# Patient Record
Sex: Female | Born: 1960 | Race: White | Hispanic: No | Smoking: Current every day smoker
Health system: Southern US, Community
[De-identification: ages and names within clinical notes are randomized; demographics above are authoritative.]

## PROBLEM LIST (undated history)

## (undated) DIAGNOSIS — O1490 Unspecified pre-eclampsia, unspecified trimester: Secondary | ICD-10-CM

## (undated) DIAGNOSIS — M7918 Myalgia, other site: Secondary | ICD-10-CM

## (undated) DIAGNOSIS — R0602 Shortness of breath: Secondary | ICD-10-CM

## (undated) DIAGNOSIS — I5032 Chronic diastolic (congestive) heart failure: Secondary | ICD-10-CM

## (undated) DIAGNOSIS — I10 Essential (primary) hypertension: Secondary | ICD-10-CM

## (undated) DIAGNOSIS — M5020 Other cervical disc displacement, unspecified cervical region: Secondary | ICD-10-CM

## (undated) DIAGNOSIS — M545 Low back pain, unspecified: Secondary | ICD-10-CM

## (undated) DIAGNOSIS — E785 Hyperlipidemia, unspecified: Secondary | ICD-10-CM

## (undated) DIAGNOSIS — M1990 Unspecified osteoarthritis, unspecified site: Secondary | ICD-10-CM

## (undated) DIAGNOSIS — E119 Type 2 diabetes mellitus without complications: Secondary | ICD-10-CM

## (undated) DIAGNOSIS — G8929 Other chronic pain: Secondary | ICD-10-CM

## (undated) DIAGNOSIS — G4733 Obstructive sleep apnea (adult) (pediatric): Secondary | ICD-10-CM

## (undated) DIAGNOSIS — Z9989 Dependence on other enabling machines and devices: Secondary | ICD-10-CM

## (undated) HISTORY — DX: Unspecified osteoarthritis, unspecified site: M19.90

## (undated) HISTORY — DX: Type 2 diabetes mellitus without complications: E11.9

## (undated) HISTORY — DX: Low back pain, unspecified: M54.50

## (undated) HISTORY — DX: Hyperlipidemia, unspecified: E78.5

## (undated) HISTORY — DX: Other cervical disc displacement, unspecified cervical region: M50.20

## (undated) HISTORY — DX: Chronic diastolic (congestive) heart failure: I50.32

## (undated) HISTORY — DX: Unspecified pre-eclampsia, unspecified trimester: O14.90

## (undated) HISTORY — DX: Low back pain: M54.5

## (undated) HISTORY — DX: Other chronic pain: G89.29

## (undated) HISTORY — DX: Myalgia, other site: M79.18

## (undated) HISTORY — DX: Shortness of breath: R06.02

## (undated) HISTORY — DX: Dependence on other enabling machines and devices: Z99.89

## (undated) HISTORY — DX: Obstructive sleep apnea (adult) (pediatric): G47.33

## (undated) HISTORY — PX: TUBAL LIGATION: SHX77

## (undated) HISTORY — DX: Essential (primary) hypertension: I10

---

## 2000-05-05 ENCOUNTER — Ambulatory Visit (HOSPITAL_BASED_OUTPATIENT_CLINIC_OR_DEPARTMENT_OTHER): Admission: RE | Admit: 2000-05-05 | Discharge: 2000-05-05 | Payer: Self-pay | Admitting: *Deleted

## 2001-04-06 ENCOUNTER — Encounter: Payer: Self-pay | Admitting: Emergency Medicine

## 2001-04-06 ENCOUNTER — Encounter: Payer: Self-pay | Admitting: Orthopedic Surgery

## 2001-04-06 ENCOUNTER — Observation Stay (HOSPITAL_COMMUNITY): Admission: EM | Admit: 2001-04-06 | Discharge: 2001-04-07 | Payer: Self-pay | Admitting: Emergency Medicine

## 2001-04-07 ENCOUNTER — Encounter: Payer: Self-pay | Admitting: Orthopedic Surgery

## 2005-11-08 ENCOUNTER — Emergency Department (HOSPITAL_COMMUNITY): Admission: EM | Admit: 2005-11-08 | Discharge: 2005-11-08 | Payer: Self-pay | Admitting: Emergency Medicine

## 2009-11-20 ENCOUNTER — Ambulatory Visit (HOSPITAL_COMMUNITY): Admission: RE | Admit: 2009-11-20 | Discharge: 2009-11-20 | Payer: Self-pay | Admitting: Gastroenterology

## 2010-04-27 ENCOUNTER — Encounter: Admission: RE | Admit: 2010-04-27 | Discharge: 2010-04-27 | Payer: Self-pay | Admitting: Gastroenterology

## 2010-06-04 ENCOUNTER — Inpatient Hospital Stay (HOSPITAL_COMMUNITY): Admission: EM | Admit: 2010-06-04 | Discharge: 2009-11-05 | Payer: Self-pay | Admitting: Emergency Medicine

## 2010-09-14 LAB — PROTEIN, BODY FLUID: Total protein, fluid: <3 g/dL

## 2010-09-14 LAB — BODY FLUID CELL COUNT WITH DIFFERENTIAL
Eos, Fluid: 0 %
Lymphs, Fluid: 11 %
Monocyte-Macrophage-Serous Fluid: 88 % (ref 50–90)
Neutrophil Count, Fluid: 1 % (ref 0–25)
Total Nucleated Cell Count, Fluid: 210 cu mm (ref 0–1000)

## 2010-09-14 LAB — ALBUMIN, FLUID (OTHER): Albumin, Fluid: <1 g/dL

## 2010-09-14 LAB — PATHOLOGIST SMEAR REVIEW

## 2010-09-15 LAB — COMPREHENSIVE METABOLIC PANEL
ALT: 22 U/L (ref 0–35)
ALT: 23 U/L (ref 0–35)
ALT: 23 U/L (ref 0–35)
ALT: 24 U/L (ref 0–35)
ALT: 25 U/L (ref 0–35)
ALT: 32 U/L (ref 0–35)
AST: 119 U/L — ABNORMAL HIGH (ref 0–37)
AST: 129 U/L — ABNORMAL HIGH (ref 0–37)
AST: 136 U/L — ABNORMAL HIGH (ref 0–37)
AST: 136 U/L — ABNORMAL HIGH (ref 0–37)
AST: 140 U/L — ABNORMAL HIGH (ref 0–37)
AST: 174 U/L — ABNORMAL HIGH (ref 0–37)
Albumin: 1.7 g/dL — ABNORMAL LOW (ref 3.5–5.2)
Albumin: 1.9 g/dL — ABNORMAL LOW (ref 3.5–5.2)
Albumin: 1.9 g/dL — ABNORMAL LOW (ref 3.5–5.2)
Albumin: 1.9 g/dL — ABNORMAL LOW (ref 3.5–5.2)
Albumin: 2 g/dL — ABNORMAL LOW (ref 3.5–5.2)
Albumin: 2.3 g/dL — ABNORMAL LOW (ref 3.5–5.2)
Alkaline Phosphatase: 110 U/L (ref 39–117)
Alkaline Phosphatase: 113 U/L (ref 39–117)
Alkaline Phosphatase: 116 U/L (ref 39–117)
Alkaline Phosphatase: 128 U/L — ABNORMAL HIGH (ref 39–117)
Alkaline Phosphatase: 153 U/L — ABNORMAL HIGH (ref 39–117)
Alkaline Phosphatase: 98 U/L (ref 39–117)
BUN: 5 mg/dL — ABNORMAL LOW (ref 6–23)
BUN: 5 mg/dL — ABNORMAL LOW (ref 6–23)
BUN: 5 mg/dL — ABNORMAL LOW (ref 6–23)
BUN: 5 mg/dL — ABNORMAL LOW (ref 6–23)
BUN: 6 mg/dL (ref 6–23)
BUN: 6 mg/dL (ref 6–23)
CO2: 23 mEq/L (ref 19–32)
CO2: 25 mEq/L (ref 19–32)
CO2: 25 mEq/L (ref 19–32)
CO2: 26 mEq/L (ref 19–32)
CO2: 26 mEq/L (ref 19–32)
CO2: 27 mEq/L (ref 19–32)
Calcium: 7 mg/dL — ABNORMAL LOW (ref 8.4–10.5)
Calcium: 7.2 mg/dL — ABNORMAL LOW (ref 8.4–10.5)
Calcium: 7.4 mg/dL — ABNORMAL LOW (ref 8.4–10.5)
Calcium: 7.5 mg/dL — ABNORMAL LOW (ref 8.4–10.5)
Calcium: 7.5 mg/dL — ABNORMAL LOW (ref 8.4–10.5)
Calcium: 8 mg/dL — ABNORMAL LOW (ref 8.4–10.5)
Chloride: 77 mEq/L — ABNORMAL LOW (ref 96–112)
Chloride: 78 mEq/L — ABNORMAL LOW (ref 96–112)
Chloride: 84 mEq/L — ABNORMAL LOW (ref 96–112)
Chloride: 87 mEq/L — ABNORMAL LOW (ref 96–112)
Chloride: 89 mEq/L — ABNORMAL LOW (ref 96–112)
Chloride: 94 mEq/L — ABNORMAL LOW (ref 96–112)
Creatinine, Ser: 0.68 mg/dL (ref 0.4–1.2)
Creatinine, Ser: 0.73 mg/dL (ref 0.4–1.2)
Creatinine, Ser: 0.74 mg/dL (ref 0.4–1.2)
Creatinine, Ser: 0.77 mg/dL (ref 0.4–1.2)
Creatinine, Ser: 0.82 mg/dL (ref 0.4–1.2)
Creatinine, Ser: 0.91 mg/dL (ref 0.4–1.2)
GFR calc Af Amer: >60 mL/min (ref >60)
GFR calc Af Amer: >60 mL/min (ref >60)
GFR calc Af Amer: >60 mL/min (ref >60)
GFR calc Af Amer: >60 mL/min (ref >60)
GFR calc Af Amer: >60 mL/min (ref >60)
GFR calc Af Amer: >60 mL/min (ref >60)
GFR calc non Af Amer: >60 mL/min (ref >60)
GFR calc non Af Amer: >60 mL/min (ref >60)
GFR calc non Af Amer: >60 mL/min (ref >60)
GFR calc non Af Amer: >60 mL/min (ref >60)
GFR calc non Af Amer: >60 mL/min (ref >60)
GFR calc non Af Amer: >60 mL/min (ref >60)
Glucose, Bld: 100 mg/dL — ABNORMAL HIGH (ref 70–99)
Glucose, Bld: 116 mg/dL — ABNORMAL HIGH (ref 70–99)
Glucose, Bld: 126 mg/dL — ABNORMAL HIGH (ref 70–99)
Glucose, Bld: 88 mg/dL (ref 70–99)
Glucose, Bld: 99 mg/dL (ref 70–99)
Glucose, Bld: 99 mg/dL (ref 70–99)
Potassium: 2.7 mEq/L — CL (ref 3.5–5.1)
Potassium: 2.7 mEq/L — CL (ref 3.5–5.1)
Potassium: 3.1 mEq/L — ABNORMAL LOW (ref 3.5–5.1)
Potassium: 3.8 mEq/L (ref 3.5–5.1)
Potassium: 4 mEq/L (ref 3.5–5.1)
Potassium: 4.6 mEq/L (ref 3.5–5.1)
Sodium: 117 mEq/L — CL (ref 135–145)
Sodium: 118 mEq/L — CL (ref 135–145)
Sodium: 120 mEq/L — ABNORMAL LOW (ref 135–145)
Sodium: 122 mEq/L — ABNORMAL LOW (ref 135–145)
Sodium: 123 mEq/L — ABNORMAL LOW (ref 135–145)
Sodium: 123 mEq/L — ABNORMAL LOW (ref 135–145)
Total Bilirubin: 7.8 mg/dL — ABNORMAL HIGH (ref 0.3–1.2)
Total Bilirubin: 8 mg/dL — ABNORMAL HIGH (ref 0.3–1.2)
Total Bilirubin: 8.2 mg/dL — ABNORMAL HIGH (ref 0.3–1.2)
Total Bilirubin: 8.2 mg/dL — ABNORMAL HIGH (ref 0.3–1.2)
Total Bilirubin: 8.5 mg/dL — ABNORMAL HIGH (ref 0.3–1.2)
Total Bilirubin: 9.1 mg/dL — ABNORMAL HIGH (ref 0.3–1.2)
Total Protein: 11.8 g/dL — ABNORMAL HIGH (ref 6.0–8.3)
Total Protein: 7.8 g/dL (ref 6.0–8.3)
Total Protein: 8.1 g/dL (ref 6.0–8.3)
Total Protein: 8.1 g/dL (ref 6.0–8.3)
Total Protein: 8.5 g/dL — ABNORMAL HIGH (ref 6.0–8.3)
Total Protein: 9.9 g/dL — ABNORMAL HIGH (ref 6.0–8.3)

## 2010-09-15 LAB — BASIC METABOLIC PANEL
BUN: 6 mg/dL (ref 6–23)
BUN: 6 mg/dL (ref 6–23)
BUN: 7 mg/dL (ref 6–23)
CO2: 22 mEq/L (ref 19–32)
CO2: 22 mEq/L (ref 19–32)
CO2: 25 mEq/L (ref 19–32)
Calcium: 7.1 mg/dL — ABNORMAL LOW (ref 8.4–10.5)
Calcium: 7.8 mg/dL — ABNORMAL LOW (ref 8.4–10.5)
Calcium: 8.2 mg/dL — ABNORMAL LOW (ref 8.4–10.5)
Chloride: 83 mEq/L — ABNORMAL LOW (ref 96–112)
Chloride: 95 mEq/L — ABNORMAL LOW (ref 96–112)
Chloride: 98 mEq/L (ref 96–112)
Creatinine, Ser: 0.68 mg/dL (ref 0.4–1.2)
Creatinine, Ser: 0.71 mg/dL (ref 0.4–1.2)
Creatinine, Ser: 0.73 mg/dL (ref 0.4–1.2)
GFR calc Af Amer: >60 mL/min (ref >60)
GFR calc Af Amer: >60 mL/min (ref >60)
GFR calc Af Amer: >60 mL/min (ref >60)
GFR calc non Af Amer: >60 mL/min (ref >60)
GFR calc non Af Amer: >60 mL/min (ref >60)
GFR calc non Af Amer: >60 mL/min (ref >60)
Glucose, Bld: 105 mg/dL — ABNORMAL HIGH (ref 70–99)
Glucose, Bld: 83 mg/dL (ref 70–99)
Glucose, Bld: 91 mg/dL (ref 70–99)
Potassium: 3.6 mEq/L (ref 3.5–5.1)
Potassium: 4.8 mEq/L (ref 3.5–5.1)
Potassium: 5 mEq/L (ref 3.5–5.1)
Sodium: 118 mEq/L — CL (ref 135–145)
Sodium: 125 mEq/L — ABNORMAL LOW (ref 135–145)
Sodium: 125 mEq/L — ABNORMAL LOW (ref 135–145)

## 2010-09-15 LAB — CBC
HCT: 29.3 % — ABNORMAL LOW (ref 36.0–46.0)
HCT: 30.3 % — ABNORMAL LOW (ref 36.0–46.0)
HCT: 30.8 % — ABNORMAL LOW (ref 36.0–46.0)
HCT: 31.3 % — ABNORMAL LOW (ref 36.0–46.0)
HCT: 36.2 % (ref 36.0–46.0)
Hemoglobin: 10.5 g/dL — ABNORMAL LOW (ref 12.0–15.0)
Hemoglobin: 10.7 g/dL — ABNORMAL LOW (ref 12.0–15.0)
Hemoglobin: 10.8 g/dL — ABNORMAL LOW (ref 12.0–15.0)
Hemoglobin: 11.1 g/dL — ABNORMAL LOW (ref 12.0–15.0)
Hemoglobin: 12.8 g/dL (ref 12.0–15.0)
MCHC: 35 g/dL (ref 30.0–36.0)
MCHC: 35.3 g/dL (ref 30.0–36.0)
MCHC: 35.4 g/dL (ref 30.0–36.0)
MCHC: 35.6 g/dL (ref 30.0–36.0)
MCHC: 35.8 g/dL (ref 30.0–36.0)
MCV: 105.3 fL — ABNORMAL HIGH (ref 78.0–100.0)
MCV: 105.6 fL — ABNORMAL HIGH (ref 78.0–100.0)
MCV: 105.8 fL — ABNORMAL HIGH (ref 78.0–100.0)
MCV: 105.8 fL — ABNORMAL HIGH (ref 78.0–100.0)
MCV: 106.3 fL — ABNORMAL HIGH (ref 78.0–100.0)
Platelets: 121 10*3/uL — ABNORMAL LOW (ref 150–400)
Platelets: 130 10*3/uL — ABNORMAL LOW (ref 150–400)
Platelets: 135 10*3/uL — ABNORMAL LOW (ref 150–400)
Platelets: 136 10*3/uL — ABNORMAL LOW (ref 150–400)
Platelets: 141 10*3/uL — ABNORMAL LOW (ref 150–400)
RBC: 2.79 MIL/uL — ABNORMAL LOW (ref 3.87–5.11)
RBC: 2.87 MIL/uL — ABNORMAL LOW (ref 3.87–5.11)
RBC: 2.9 MIL/uL — ABNORMAL LOW (ref 3.87–5.11)
RBC: 2.96 MIL/uL — ABNORMAL LOW (ref 3.87–5.11)
RBC: 3.42 MIL/uL — ABNORMAL LOW (ref 3.87–5.11)
RDW: 18.4 % — ABNORMAL HIGH (ref 11.5–15.5)
RDW: 18.5 % — ABNORMAL HIGH (ref 11.5–15.5)
RDW: 18.6 % — ABNORMAL HIGH (ref 11.5–15.5)
RDW: 18.8 % — ABNORMAL HIGH (ref 11.5–15.5)
RDW: 18.9 % — ABNORMAL HIGH (ref 11.5–15.5)
WBC: 13 10*3/uL — ABNORMAL HIGH (ref 4.0–10.5)
WBC: 14.3 10*3/uL — ABNORMAL HIGH (ref 4.0–10.5)
WBC: 14.6 10*3/uL — ABNORMAL HIGH (ref 4.0–10.5)
WBC: 15 10*3/uL — ABNORMAL HIGH (ref 4.0–10.5)
WBC: 15.7 10*3/uL — ABNORMAL HIGH (ref 4.0–10.5)

## 2010-09-15 LAB — URINE MICROSCOPIC-ADD ON

## 2010-09-15 LAB — URINALYSIS, ROUTINE W REFLEX MICROSCOPIC
Glucose, UA: NEGATIVE mg/dL
Hgb urine dipstick: NEGATIVE
Ketones, ur: 15 mg/dL — AB
Nitrite: POSITIVE — AB
Protein, ur: 30 mg/dL — AB
Specific Gravity, Urine: 1.026 (ref 1.005–1.030)
Urobilinogen, UA: 2 mg/dL — ABNORMAL HIGH (ref 0.0–1.0)
pH: 5 (ref 5.0–8.0)

## 2010-09-15 LAB — MAGNESIUM
Magnesium: 1.1 mg/dL — ABNORMAL LOW (ref 1.5–2.5)
Magnesium: 1.9 mg/dL (ref 1.5–2.5)

## 2010-09-15 LAB — PROTIME-INR
INR: 1.58 — ABNORMAL HIGH (ref 0.00–1.49)
INR: 1.66 — ABNORMAL HIGH (ref 0.00–1.49)
INR: 1.72 — ABNORMAL HIGH (ref 0.00–1.49)
INR: 1.73 — ABNORMAL HIGH (ref 0.00–1.49)
INR: 1.76 — ABNORMAL HIGH (ref 0.00–1.49)
INR: 1.81 — ABNORMAL HIGH (ref 0.00–1.49)
Prothrombin Time: 18.7 seconds — ABNORMAL HIGH (ref 11.6–15.2)
Prothrombin Time: 19.5 seconds — ABNORMAL HIGH (ref 11.6–15.2)
Prothrombin Time: 20 seconds — ABNORMAL HIGH (ref 11.6–15.2)
Prothrombin Time: 20.1 seconds — ABNORMAL HIGH (ref 11.6–15.2)
Prothrombin Time: 20.4 seconds — ABNORMAL HIGH (ref 11.6–15.2)
Prothrombin Time: 20.8 seconds — ABNORMAL HIGH (ref 11.6–15.2)

## 2010-09-15 LAB — AMMONIA
Ammonia: 111 umol/L — ABNORMAL HIGH (ref 11–35)
Ammonia: 45 umol/L — ABNORMAL HIGH (ref 11–35)
Ammonia: 45 umol/L — ABNORMAL HIGH (ref 11–35)

## 2010-09-15 LAB — VITAMIN B12: Vitamin B-12: 1880 pg/mL — ABNORMAL HIGH (ref 211–911)

## 2010-09-15 LAB — DIFFERENTIAL
Basophils Absolute: 0 10*3/uL (ref 0.0–0.1)
Basophils Relative: 0 % (ref 0–1)
Eosinophils Absolute: 0.1 10*3/uL (ref 0.0–0.7)
Eosinophils Relative: 1 % (ref 0–5)
Lymphocytes Relative: 12 % (ref 12–46)
Lymphs Abs: 1.7 10*3/uL (ref 0.7–4.0)
Monocytes Absolute: 1.7 10*3/uL — ABNORMAL HIGH (ref 0.1–1.0)
Monocytes Relative: 11 % (ref 3–12)
Neutro Abs: 11.1 10*3/uL — ABNORMAL HIGH (ref 1.7–7.7)
Neutrophils Relative %: 76 % (ref 43–77)

## 2010-09-15 LAB — URINE CULTURE: Colony Count: 25000

## 2010-09-15 LAB — RAPID URINE DRUG SCREEN, HOSP PERFORMED
Amphetamines: NOT DETECTED
Barbiturates: NOT DETECTED
Benzodiazepines: NOT DETECTED
Cocaine: NOT DETECTED
Opiates: POSITIVE — AB
Tetrahydrocannabinol: NOT DETECTED

## 2010-09-15 LAB — T4, FREE: Free T4: 1.53 ng/dL (ref 0.80–1.80)

## 2010-09-15 LAB — GLUCOSE, CAPILLARY
Glucose-Capillary: 103 mg/dL — ABNORMAL HIGH (ref 70–99)
Glucose-Capillary: 111 mg/dL — ABNORMAL HIGH (ref 70–99)

## 2010-09-15 LAB — TSH: TSH: 9.884 u[IU]/mL — ABNORMAL HIGH (ref 0.350–4.500)

## 2010-09-15 LAB — APTT
aPTT: 44 seconds — ABNORMAL HIGH (ref 24–37)
aPTT: 44 seconds — ABNORMAL HIGH (ref 24–37)

## 2010-09-15 LAB — HEPATITIS PANEL, ACUTE
HCV Ab: NEGATIVE
Hep A IgM: NEGATIVE
Hep B C IgM: POSITIVE — AB
Hepatitis B Surface Ag: NEGATIVE

## 2010-09-15 LAB — HIV ANTIBODY (ROUTINE TESTING W REFLEX): HIV: NONREACTIVE

## 2010-09-15 LAB — T3, FREE: T3, Free: 1.8 pg/mL — ABNORMAL LOW (ref 2.3–4.2)

## 2010-09-15 LAB — FOLATE RBC: RBC Folate: 291 ng/mL (ref 180–600)

## 2010-09-15 LAB — CORTISOL: Cortisol, Plasma: 20 ug/dL

## 2010-09-15 LAB — OSMOLALITY: Osmolality: 243 mOsm/kg — ABNORMAL LOW (ref 275–300)

## 2010-09-15 LAB — FOLATE: Folate: 4.7 ng/mL

## 2010-09-15 LAB — OSMOLALITY, URINE: Osmolality, Ur: 520 mOsm/kg (ref 390–1090)

## 2010-11-13 NOTE — H&P (Signed)
[place]. [facility]  Patient:    Stacey Monroe, Stacey Monroe Visit Number: [id number] MRN: [id number]          Service Type: OBV Location: 5000 5028 01 Attending Physician:  Burnard Bunting Dictated by:   Cammy Copa, M.D. Admit Date:  04/06/2001                           History and Physical  CHIEF COMPLAINT:  Right elbow pain.  HISTORY OF PRESENT ILLNESS:  Stacey Monroe is a 50 year old, right-hand dominant female who fell in the hall tonight about 1800 hours.  She complains of right elbow pain.  She denies any other orthopedic complaints.  She last ate or drank something at 4 p.m.  She did have a six-pack today, which she finished also around 4 p.m.  PAST MEDICAL HISTORY:  Significant for depression.  PAST SURGICAL HISTORY:  She has no past surgical history.  ALLERGIES:  She is allergic to PENICILLIN.  CURRENT MEDICATIONS:  Zoloft.  PHYSICAL EXAMINATION:  She is alert and oriented x 3.  CHEST:  Clear to auscultation.  HEART:  Regular rate and rhythm.  ABDOMEN:  Benign exam.  EXTREMITIES:  She has right elbow deformity and no wrist pain.  EPL, FPL, and interosseous 5+/5.  Radial pulse 2+/4.  Sensation is okay in the median and radial ulnar distribution.  X-rays show a posterolateral dislocation of the right elbow.  IMPRESSION:  Dislocation of right elbow.  PLAN:  Closed reduction is attempted after the risks and benefits are discussed with the patient.  I did inject about 10 cc of lidocaine into the joint through a posterolateral portal.  I also gave her 4 mg of morphine and Versed.  Adequate analgesia was really able to be obtained as the reduction was painful for the patient.  Because of her alcohol intake, it think it would be safer to proceed with repeat closed reduction in the OR.  The risks and benefits were discussed with the patient and her boyfriend, as well as with Conan Bowens, her sister.  The primary risks include a need for  possible open reduction, as well as risk of fracture, as well as nerve and muscle damage and subsequent elbow stiffness.  All questions were answered.  The risks and benefits again are discussed with the patient, her boyfriend, Francis Dowse, and her sister.  Her sister is her closest relative and she does give consent. Dictated by:   Cammy Copa, M.D. Attending Physician:  Burnard Bunting DD:  04/06/01 TD:  04/07/01 Job: [phone]

## 2010-11-13 NOTE — Op Note (Signed)
[place]. [facility]  Patient:    Stacey Monroe, Stacey Monroe Visit Number: [id number] MRN: [id number]          Service Type: OBV Location: 5000 5028 01 Attending Physician:  Burnard Bunting Dictated by:   Cammy Copa, M.D. Admit Date:  04/06/2001 Discharge Date: 04/07/2001                             Operative Report  PREOPERATIVE DIAGNOSIS:  Right elbow posterolateral dislocation.  POSTOPERATIVE DIAGNOSIS:  Right elbow posterolateral dislocation.  PROCEDURE:  Closed reduction of right posterolateral elbow dislocation.  SURGEON:  Cammy Copa, M.D.  ANESTHESIA:  General endotracheal.  ESTIMATED BLOOD LOSS:  None.  INDICATION:  The patient is a 50 year old patient who sustained a posterolateral right elbow dislocation.  She presents now for operative management after a failed attempt at closed reduction in the ER.  DESCRIPTION OF PROCEDURE:  The patient was brought in the operating room, where general endotracheal anesthesia was induced.  Under fluoroscopic guidance the elbow was noted to have sustained a posterior lateral dislocation.  The ulna and radius were translated medially with distraction. This was confirmed in the AP and lateral planes under fluoroscopy.  At that time using a combination of distraction, traction, and thumb pressure over the olecranon, once the medial and lateral component was corrected, the posterior component was corrected.  The elbow was then taken through a range of motion and found to be stable in terms of posterior dislocation but was unstable medially and laterally with internal rotation of the humerus.  For this reason the patient was splinted in the midrange between pronation and supination. Intraoperative fluoroscopy demonstrated a concentric reduction with no fractures.  There was some posterolateral rotatory instability noted, but that was stable when the arm was pronated and taken through a range of motion.   The patient was transferred to the recovery room in stable condition. Dictated by:   Cammy Copa, M.D. Attending Physician:  Burnard Bunting DD:  04/06/01 TD:  04/07/01 Job: [id number]

## 2011-12-08 ENCOUNTER — Other Ambulatory Visit: Payer: Self-pay | Admitting: Gastroenterology

## 2011-12-08 DIAGNOSIS — K7460 Unspecified cirrhosis of liver: Secondary | ICD-10-CM

## 2012-01-03 ENCOUNTER — Ambulatory Visit
Admission: RE | Admit: 2012-01-03 | Discharge: 2012-01-03 | Disposition: A | Discharge status: Home / Self Care | Payer: No Typology Code available for payment source | Source: Ambulatory Visit | Attending: Gastroenterology | Admitting: Gastroenterology

## 2012-01-03 DIAGNOSIS — K7460 Unspecified cirrhosis of liver: Secondary | ICD-10-CM

## 2016-03-29 ENCOUNTER — Ambulatory Visit: Payer: No Typology Code available for payment source | Admitting: Physician Assistant

## 2016-04-06 ENCOUNTER — Encounter: Payer: Self-pay | Admitting: Physician Assistant

## 2016-04-06 ENCOUNTER — Ambulatory Visit (INDEPENDENT_AMBULATORY_CARE_PROVIDER_SITE_OTHER): Payer: Medicare Other | Admitting: Physician Assistant

## 2016-04-06 VITALS — BP 160/90 | HR 87 | Ht 68.0 in | Wt 274.0 lb

## 2016-04-06 DIAGNOSIS — R0602 Shortness of breath: Secondary | ICD-10-CM | POA: Diagnosis not present

## 2016-04-06 DIAGNOSIS — Z720 Tobacco use: Secondary | ICD-10-CM

## 2016-04-06 DIAGNOSIS — R002 Palpitations: Secondary | ICD-10-CM | POA: Diagnosis not present

## 2016-04-06 DIAGNOSIS — K7030 Alcoholic cirrhosis of liver without ascites: Secondary | ICD-10-CM

## 2016-04-06 DIAGNOSIS — R0789 Other chest pain: Secondary | ICD-10-CM

## 2016-04-06 DIAGNOSIS — F439 Reaction to severe stress, unspecified: Secondary | ICD-10-CM | POA: Insufficient documentation

## 2016-04-06 DIAGNOSIS — I10 Essential (primary) hypertension: Secondary | ICD-10-CM | POA: Diagnosis not present

## 2016-04-06 DIAGNOSIS — K7460 Unspecified cirrhosis of liver: Secondary | ICD-10-CM | POA: Insufficient documentation

## 2016-04-06 MED ORDER — LISINOPRIL 10 MG PO TABS: 10.0000 mg | ORAL_TABLET | Freq: Every day | ORAL | 3 refills | Status: DC

## 2016-04-06 MED ORDER — LISINOPRIL 5 MG PO TABS: 5.0000 mg | ORAL_TABLET | Freq: Every day | ORAL | 3 refills | Status: DC

## 2016-04-06 NOTE — Progress Notes (Signed)
Cardiology Office Note    Date:  04/06/2016   ID:  [name], DOB [birth year], MRN [id number]  PCP:  No primary care provider on file.  Cardiologist:   Chief Complaint  Patient presents with  . Chest Pain    History of Present Illness:  Stacey Monroe is a 55 y.o. female referred to [place] by Karin Lieu [facility] for evaluation chest pain. She has a history of hypertension and amlodipine was just added to her medications.  Patient complains of palpitations with some dizziness and ringing in her ears that would last a few seconds. Associated chest pain described as a stabbing sensation. Feels like a fist in her chest but no heaviness or tightness. She felt like she was holding her breath. Has chronic dyspnea on exertion she attributes to lack of exercise and weight.  Extreme stress since January. Reduced a stressor last week and palpitations have lessoned and no associated chest pain. HTN, no DM, ? HLD. Smokes 1 ppd x 20 yrs. Disabled from cirrhotic liver secondary to ETOH-quit in 2011.    Past Medical History:  Diagnosis Date  . Chronic diastolic heart failure ([place])   . Chronic musculoskeletal pain   . Diabetes mellitus without complication ([place])   . Hyperlipidemia   . Hypertension   . Low back pain   . OSA on CPAP   . Osteoarthritis    knees  . Pre-eclampsia   . Sleep apnea, obstructive   . Slipped cervical disc   . SOB (shortness of breath)     No past surgical history on file.  Current Medications: Outpatient Medications Prior to Visit  Medication Sig Dispense Refill  . acetaminophen (TYLENOL) 500 MG tablet Take 1,000 mg by mouth 3 (three) times daily.    Marland Kitchen aspirin 325 MG tablet Take 325 mg by mouth daily.    Marland Kitchen atorvastatin (LIPITOR) 40 MG tablet Take 40 mg by mouth daily.    . B Complex Vitamins (B COMPLEX PO) Take by mouth.    . bimatoprost (LUMIGAN) 0.01 % SOLN 1 drop at bedtime.    Marland Kitchen diltiazem (CARDIZEM LA) 420 MG 24 hr tablet Take 420 mg by mouth daily.      Marland Kitchen ezetimibe (ZETIA) 10 MG tablet Take 10 mg by mouth daily.    Marland Kitchen glucosamine-chondroitin 500-400 MG tablet Take 2 tablets by mouth daily.    Marland Kitchen HYDROXYZINE HCL PO Take 25 mg by mouth. 1/2-1 TABLET ORALLY AT NIGHT IF NEEDED    . insulin glargine (LANTUS) 100 UNIT/ML injection Inject 100 Units into the skin at bedtime.    Marland Kitchen levothyroxine (SYNTHROID, LEVOTHROID) 75 MCG tablet Take 75 mcg by mouth daily before breakfast.    . losartan-hydrochlorothiazide (HYZAAR) 100-25 MG tablet Take 1 tablet by mouth daily.    . metFORMIN (GLUCOPHAGE) 1000 MG tablet Take 1,000 mg by mouth 2 (two) times daily with a meal.    . Multiple Vitamins-Minerals (CENTRUM SILVER PO) Take by mouth.    . pantoprazole (PROTONIX) 40 MG tablet Take 40 mg by mouth daily.    . vitamin B-12 (CYANOCOBALAMIN) 100 MCG tablet Take 100 mcg by mouth daily.    . vitamin C (ASCORBIC ACID) 500 MG tablet Take 500 mg by mouth daily.     No facility-administered medications prior to visit.      Allergies:   Penicillin g   Social History   Social History  . Marital status: Legally Separated    Spouse name: N/A  .  Number of children: N/A  . Years of education: N/A   Social History Main Topics  . Smoking status: Former Research scientist (life sciences)  . Smokeless tobacco: Never Used  . Alcohol use No  . Drug use: No  . Sexual activity: Yes     Comment: MARRIED   Other Topics Concern  . None   Social History Narrative  . None     Family History:  The patient's Family history is negative for CAD. Mother died of Alzheimer's and father died of cancer of the bile duct   ROS:   Please see the history of present illness.    Review of Systems  Constitution: Positive for malaise/fatigue.  HENT: Positive for headaches.   Eyes: Negative.   Cardiovascular: Positive for chest pain, dyspnea on exertion, irregular heartbeat and palpitations.  Respiratory: Positive for snoring.   Hematologic/Lymphatic: Negative.   Musculoskeletal: Negative.  Negative for  joint pain.  Gastrointestinal: Negative.   Genitourinary: Negative.   Neurological: Positive for dizziness and loss of balance.   All other systems reviewed and are negative.   PHYSICAL EXAM:   VS:  BP (!) 160/90 (BP Location: Right Arm, Patient Position: Sitting, Cuff Size: Large)   Pulse 87   Ht '5\' 8"'$  (1.727 m)   Wt 274 lb (124.3 kg)   BMI 41.66 kg/m   Physical Exam  GEN: Obese in no acute distress  Neck: no JVD, carotid bruits, or masses Cardiac:RRR; positive S4, no murmurs, rubs,  Respiratory:  clear to auscultation bilaterally, normal work of breathing GI: soft, nontender, nondistended, + BS Ext: without cyanosis, clubbing, or edema, Good distal pulses bilaterally MS: no deformity or atrophy  Skin: warm and dry, no rash Psych: euthymic mood, full affect  Wt Readings from Last 3 Encounters:  04/06/16 274 lb (124.3 kg)      Studies/Labs Reviewed:   EKG:  EKG is ordered today.  The ekg ordered today demonstrates Normal sinus rhythm, normal EKG  Recent Labs: No results found for requested labs within last 8760 hours.   Lipid Panel No results found for: CHOL, TRIG, HDL, CHOLHDL, VLDL, LDLCALC, LDLDIRECT  Additional studies/ records that were reviewed today include:  Records reviewed from [facility]. Labs stable including normal TSH. Lipid profile not done.    ASSESSMENT:    1. Other chest pain   2. Palpitations   3. Shortness of breath   4. Essential hypertension   5. Alcoholic cirrhosis of liver without ascites ([place])   6. Tobacco abuse   7. Stress      PLAN:  In order of problems listed above:  Chest pain somewhat atypical and typical symptoms always associated with palpitations. With cardiac risk factors need to rule out ischemia. We'll order Lexi scan Myoview as patient can't walk on the treadmill. Dr. Tamala Julian saw the patient and agrees with current plan.  Palpitations worsened with stress but occurs at any time. Fleeting and short  lived. Labs were stable. Will place 30 day monitor to rule out arrhythmia.   Essential hypertension still elevated. Will add lisinopril to 5 mg daily.  Alcoholic cirrhosis of the liver quit drinking in 2011. Managed with spironolactone and Lasix daily   Tobacco abuse smoking cessation discussed   stress and anxiety are playing a large role in her symptoms.         Medication Adjustments/Labs and Tests Ordered: Current medicines are reviewed at length with the patient today.  Concerns regarding medicines are outlined above.  Medication changes, Labs  and Tests ordered today are listed in the Patient Instructions below. Patient Instructions  Medication Instructions:  Start lisinopril '5mg'$  daily.   Labwork: Your physician recommends that you return for a FASTING lipid profile.   Testing/Procedures: Your physician has requested that you have an echocardiogram. Echocardiography is a painless test that uses sound waves to create images of your heart. It provides your doctor with information about the size and shape of your heart and how well your heart's chambers and valves are working. This procedure takes approximately one hour. There are no restrictions for this procedure.  Your physician has requested that you have a lexiscan myoview. For further information please visit [email or web address]. Please follow instruction sheet, as given.  Your physician has recommended that you wear an event monitor. Event monitors are medical devices that record the heart's electrical activity. Doctors most often [place] these monitors to diagnose arrhythmias. Arrhythmias are problems with the speed or rhythm of the heartbeat. The monitor is a small, portable device. You can wear one while you do your normal daily activities. This is usually used to diagnose what is causing palpitations/syncope (passing out).  61 DAY   Follow-Up: Your physician recommends that you schedule a follow-up appointment in: 6  weeks with Dr Tamala Julian.        If you need a refill on your cardiac medications before your next appointment, please call your pharmacy.      Signed, Ermalinda Barrios, PA-C  04/06/2016 1:28 PM    [facility] [place], [place], [place]  [address] Phone: [phone]; Fax: [phone]

## 2016-04-06 NOTE — Patient Instructions (Addendum)
Medication Instructions:  Start lisinopril '5mg'$  daily.   Labwork: Your physician recommends that you return for a FASTING lipid profile.   Testing/Procedures: Your physician has requested that you have an echocardiogram. Echocardiography is a painless test that uses sound waves to create images of your heart. It provides your doctor with information about the size and shape of your heart and how well your heart's chambers and valves are working. This procedure takes approximately one hour. There are no restrictions for this procedure.  Your physician has requested that you have a lexiscan myoview. For further information please visit [email or web address]. Please follow instruction sheet, as given.  Your physician has recommended that you wear an event monitor. Event monitors are medical devices that record the heart's electrical activity. Doctors most often [place] these monitors to diagnose arrhythmias. Arrhythmias are problems with the speed or rhythm of the heartbeat. The monitor is a small, portable device. You can wear one while you do your normal daily activities. This is usually used to diagnose what is causing palpitations/syncope (passing out).  34 DAY   Follow-Up: Your physician recommends that you schedule a follow-up appointment in: 6 weeks with Dr Tamala Julian.        If you need a refill on your cardiac medications before your next appointment, please call your pharmacy.

## 2016-04-14 ENCOUNTER — Encounter: Payer: Self-pay | Admitting: Physician Assistant

## 2016-04-22 ENCOUNTER — Telehealth: Payer: Self-pay | Admitting: Physician Assistant

## 2016-04-22 NOTE — Telephone Encounter (Signed)
(  con't from previous note 04/22/16 at 9:51 AM) Asked recent blood pressures and HR. Pt stated she does not have equipment to check BP from home. Pt will go to her pharmacy and get BP and HR and call back with information. Pt stated she did not have event monitor placed due to cost. She wants to do echo first, then do the event monitor. I explained the event monitor is a diagnostic device, which could show if pt has arrhythmias. Echo shows structures and pumping function of the heart. Informed I would forward to the Patient Care Advocate Department to see if there is any assistance to help with costs. Will also forward to Estella Husk, [place], to advise about Lisinopril, and will also send to Dr. Tamala Julian.

## 2016-04-22 NOTE — Telephone Encounter (Signed)
Called, spoke with pt. Pt is experiencing tiredness and SOB upon exertion since starting Lisinipril on 04/06/16 (Pt has hx SOB). Pt is scheduled for echo on 04/28/16.

## 2016-04-22 NOTE — Telephone Encounter (Signed)
None I am aware of. Check with admin.

## 2016-04-22 NOTE — Telephone Encounter (Signed)
Pt would like someone to give her a call concerning her medicine lisinopril (PRINIVIL,ZESTRIL) 5 MG tablet [[id number]]  She can be reached @ [phone]

## 2016-04-26 NOTE — Telephone Encounter (Signed)
Follow up     Pt c/o BP issue: STAT if pt c/o blurred vision, one-sided weakness or slurred speech  1. What are your last 5 BP readings?  10-27 157/66, 119/53; 10-28 128/67, 120/75; 10-29 157/82, 124/71; 10-30 134/72 2. Are you having any other symptoms (ex. Dizziness, headache, blurred vision, passed out)? no 3. What is your BP issue?  Pt states she stopped amlodipine and lisinopril last thurs.  Pt states she feels much better since being off medication.

## 2016-04-27 NOTE — Telephone Encounter (Signed)
Have patient f/u with primary care for HTN if she stopped lisinopril

## 2016-04-28 ENCOUNTER — Ambulatory Visit (HOSPITAL_COMMUNITY): Payer: Medicare Other | Attending: Cardiovascular Disease

## 2016-04-28 ENCOUNTER — Other Ambulatory Visit: Payer: Self-pay

## 2016-04-28 ENCOUNTER — Ambulatory Visit (HOSPITAL_COMMUNITY): Payer: Medicare Other

## 2016-04-28 DIAGNOSIS — R0789 Other chest pain: Secondary | ICD-10-CM | POA: Insufficient documentation

## 2016-04-28 DIAGNOSIS — I5030 Unspecified diastolic (congestive) heart failure: Secondary | ICD-10-CM | POA: Diagnosis not present

## 2016-04-28 DIAGNOSIS — R0602 Shortness of breath: Secondary | ICD-10-CM | POA: Insufficient documentation

## 2016-04-28 DIAGNOSIS — R002 Palpitations: Secondary | ICD-10-CM | POA: Insufficient documentation

## 2016-04-28 NOTE — Telephone Encounter (Signed)
Called pt.  She has d/c'd the Lisinopril due to some side effects, and currently still having some high bp readings. Per Ermalinda Barrios, PA-C, pt needs to f/u with her pcp.  Spoke with pt and she advised that she already has an appt with her pcp 05/08/16.  Advised pt to continue to keep a bp log and take with her to that f/u appt.  Pt agreeable with this plan and verbalized understanding.

## 2016-04-29 ENCOUNTER — Ambulatory Visit (HOSPITAL_COMMUNITY): Payer: Medicare Other

## 2016-05-19 ENCOUNTER — Ambulatory Visit: Payer: Medicare Other | Admitting: Nurse Practitioner

## 2016-07-08 ENCOUNTER — Encounter: Payer: Self-pay | Admitting: *Deleted

## 2016-07-08 ENCOUNTER — Telehealth: Payer: Self-pay | Admitting: *Deleted

## 2016-07-12 NOTE — Telephone Encounter (Signed)
It's okay to remove the monitor order per Stacey Monroe below.  +07/02/2016 Patient was complaining of palpitations and chest pain when I saw her in October. These are the test recommended to diagnose her symptoms. So yes, we still recommend she have them if she's still having symptoms. Stacey Monroe 07/01/2016 sent Stacey Monroe to check to see if she still needs monitor and stress test/pt desired to cancel at this time; will r/s post Echo if necessary/echo was completed on 04/28/2016.Stacey Monroe 06/16/2016 LMOM for pt to return call to reschedule event monitor 05/05/2016 Cancel Rsn: Patient (Pt will c/b to r/s at later date) per Stacey Monroe. Stacey Monroe 04/13/2016 pt does not want to have this done at this time she said she will see post echo. Stacey Monroe 05/31/16 LMOM for pt to return call to reschedule event monitor.Stacey Monroe  06/24/16 I SENT A Monroe TO Stacey Monroe,TO SEE IF THIS ORDER CAN BE REMOVED/Stacey Monroe

## 2017-01-10 ENCOUNTER — Other Ambulatory Visit: Payer: Self-pay | Admitting: Gastroenterology

## 2017-01-10 DIAGNOSIS — K7031 Alcoholic cirrhosis of liver with ascites: Secondary | ICD-10-CM

## 2017-01-25 ENCOUNTER — Other Ambulatory Visit: Payer: Medicare Other

## 2018-01-11 ENCOUNTER — Other Ambulatory Visit: Payer: Self-pay | Admitting: Gastroenterology

## 2018-01-11 DIAGNOSIS — K7030 Alcoholic cirrhosis of liver without ascites: Secondary | ICD-10-CM

## 2018-11-24 ENCOUNTER — Other Ambulatory Visit: Payer: Self-pay | Admitting: Family Medicine

## 2018-11-24 DIAGNOSIS — M545 Low back pain, unspecified: Secondary | ICD-10-CM

## 2018-11-24 DIAGNOSIS — R29898 Other symptoms and signs involving the musculoskeletal system: Secondary | ICD-10-CM

## 2018-11-24 DIAGNOSIS — M5442 Lumbago with sciatica, left side: Secondary | ICD-10-CM

## 2018-12-12 ENCOUNTER — Other Ambulatory Visit: Payer: Self-pay

## 2018-12-12 ENCOUNTER — Ambulatory Visit
Admission: RE | Admit: 2018-12-12 | Discharge: 2018-12-12 | Disposition: A | Discharge status: Home / Self Care | Payer: Medicare Other | Source: Ambulatory Visit | Attending: Family Medicine | Admitting: Family Medicine

## 2018-12-12 DIAGNOSIS — R29898 Other symptoms and signs involving the musculoskeletal system: Secondary | ICD-10-CM

## 2018-12-12 DIAGNOSIS — M5442 Lumbago with sciatica, left side: Secondary | ICD-10-CM

## 2018-12-12 DIAGNOSIS — M545 Low back pain, unspecified: Secondary | ICD-10-CM

## 2019-05-17 ENCOUNTER — Other Ambulatory Visit: Payer: Self-pay | Admitting: Gastroenterology

## 2019-05-17 ENCOUNTER — Other Ambulatory Visit (HOSPITAL_BASED_OUTPATIENT_CLINIC_OR_DEPARTMENT_OTHER): Payer: Self-pay | Admitting: Gastroenterology

## 2019-05-17 ENCOUNTER — Other Ambulatory Visit (HOSPITAL_COMMUNITY): Payer: Self-pay | Admitting: Gastroenterology

## 2019-05-17 DIAGNOSIS — R609 Edema, unspecified: Secondary | ICD-10-CM

## 2019-05-17 DIAGNOSIS — K7460 Unspecified cirrhosis of liver: Secondary | ICD-10-CM

## 2019-05-17 DIAGNOSIS — Z8719 Personal history of other diseases of the digestive system: Secondary | ICD-10-CM

## 2019-05-25 ENCOUNTER — Encounter (HOSPITAL_COMMUNITY): Payer: Self-pay

## 2019-05-25 ENCOUNTER — Ambulatory Visit (HOSPITAL_COMMUNITY): Payer: Medicare Other

## 2019-05-25 ENCOUNTER — Ambulatory Visit (HOSPITAL_COMMUNITY): Admission: RE | Admit: 2019-05-25 | Payer: Medicare Other | Source: Ambulatory Visit

## 2019-07-05 ENCOUNTER — Inpatient Hospital Stay (HOSPITAL_COMMUNITY)
Admission: AD | Admit: 2019-07-05 | Discharge: 2019-07-08 | DRG: 433 | Disposition: A | Discharge status: Home / Self Care | Payer: Medicare Other | Attending: Gastroenterology | Admitting: Gastroenterology

## 2019-07-05 ENCOUNTER — Encounter (HOSPITAL_COMMUNITY): Payer: Self-pay | Admitting: Gastroenterology

## 2019-07-05 DIAGNOSIS — Z20822 Contact with and (suspected) exposure to covid-19: Secondary | ICD-10-CM | POA: Diagnosis present

## 2019-07-05 DIAGNOSIS — K7030 Alcoholic cirrhosis of liver without ascites: Principal | ICD-10-CM | POA: Diagnosis present

## 2019-07-05 DIAGNOSIS — N179 Acute kidney failure, unspecified: Secondary | ICD-10-CM | POA: Diagnosis present

## 2019-07-05 DIAGNOSIS — K5730 Diverticulosis of large intestine without perforation or abscess without bleeding: Secondary | ICD-10-CM | POA: Diagnosis present

## 2019-07-05 DIAGNOSIS — K5520 Angiodysplasia of colon without hemorrhage: Secondary | ICD-10-CM | POA: Diagnosis present

## 2019-07-05 DIAGNOSIS — D509 Iron deficiency anemia, unspecified: Secondary | ICD-10-CM | POA: Diagnosis present

## 2019-07-05 DIAGNOSIS — E669 Obesity, unspecified: Secondary | ICD-10-CM | POA: Diagnosis present

## 2019-07-05 DIAGNOSIS — R601 Generalized edema: Secondary | ICD-10-CM | POA: Diagnosis not present

## 2019-07-05 DIAGNOSIS — I5032 Chronic diastolic (congestive) heart failure: Secondary | ICD-10-CM | POA: Diagnosis present

## 2019-07-05 DIAGNOSIS — K7460 Unspecified cirrhosis of liver: Secondary | ICD-10-CM | POA: Diagnosis not present

## 2019-07-05 DIAGNOSIS — D62 Acute posthemorrhagic anemia: Secondary | ICD-10-CM | POA: Diagnosis not present

## 2019-07-05 DIAGNOSIS — K31819 Angiodysplasia of stomach and duodenum without bleeding: Secondary | ICD-10-CM | POA: Diagnosis present

## 2019-07-05 DIAGNOSIS — I110 Hypertensive heart disease with heart failure: Secondary | ICD-10-CM | POA: Diagnosis present

## 2019-07-05 DIAGNOSIS — K766 Portal hypertension: Secondary | ICD-10-CM | POA: Diagnosis present

## 2019-07-05 DIAGNOSIS — K91840 Postprocedural hemorrhage and hematoma of a digestive system organ or structure following a digestive system procedure: Secondary | ICD-10-CM | POA: Diagnosis not present

## 2019-07-05 DIAGNOSIS — E119 Type 2 diabetes mellitus without complications: Secondary | ICD-10-CM | POA: Diagnosis present

## 2019-07-05 DIAGNOSIS — F17210 Nicotine dependence, cigarettes, uncomplicated: Secondary | ICD-10-CM | POA: Diagnosis present

## 2019-07-05 DIAGNOSIS — K635 Polyp of colon: Secondary | ICD-10-CM | POA: Diagnosis present

## 2019-07-05 DIAGNOSIS — E785 Hyperlipidemia, unspecified: Secondary | ICD-10-CM | POA: Diagnosis present

## 2019-07-05 DIAGNOSIS — Z79899 Other long term (current) drug therapy: Secondary | ICD-10-CM | POA: Diagnosis not present

## 2019-07-05 DIAGNOSIS — I8510 Secondary esophageal varices without bleeding: Secondary | ICD-10-CM | POA: Diagnosis present

## 2019-07-05 DIAGNOSIS — K7290 Hepatic failure, unspecified without coma: Secondary | ICD-10-CM | POA: Diagnosis present

## 2019-07-05 DIAGNOSIS — I340 Nonrheumatic mitral (valve) insufficiency: Secondary | ICD-10-CM | POA: Diagnosis not present

## 2019-07-05 DIAGNOSIS — E871 Hypo-osmolality and hyponatremia: Secondary | ICD-10-CM | POA: Diagnosis present

## 2019-07-05 LAB — COMPREHENSIVE METABOLIC PANEL
ALT: 31 U/L (ref 0–44)
AST: 84 U/L — ABNORMAL HIGH (ref 15–41)
Albumin: 2.3 g/dL — ABNORMAL LOW (ref 3.5–5.0)
Alkaline Phosphatase: 96 U/L (ref 38–126)
Anion gap: 11 (ref 5–15)
BUN: 22 mg/dL — ABNORMAL HIGH (ref 6–20)
CO2: 28 mmol/L (ref 22–32)
Calcium: 8.7 mg/dL — ABNORMAL LOW (ref 8.9–10.3)
Chloride: 85 mmol/L — ABNORMAL LOW (ref 98–111)
Creatinine, Ser: 1.54 mg/dL — ABNORMAL HIGH (ref 0.44–1.00)
GFR calc Af Amer: 42 mL/min — ABNORMAL LOW (ref >60)
GFR calc non Af Amer: 37 mL/min — ABNORMAL LOW (ref >60)
Glucose, Bld: 102 mg/dL — ABNORMAL HIGH (ref 70–99)
Potassium: 4.2 mmol/L (ref 3.5–5.1)
Sodium: 124 mmol/L — ABNORMAL LOW (ref 135–145)
Total Bilirubin: 3.4 mg/dL — ABNORMAL HIGH (ref 0.3–1.2)
Total Protein: 7.1 g/dL (ref 6.5–8.1)

## 2019-07-05 LAB — CBC
HCT: 19.7 % — ABNORMAL LOW (ref 36.0–46.0)
Hemoglobin: 6.6 g/dL — CL (ref 12.0–15.0)
MCH: 39.1 pg — ABNORMAL HIGH (ref 26.0–34.0)
MCHC: 33.5 g/dL (ref 30.0–36.0)
MCV: 116.6 fL — ABNORMAL HIGH (ref 80.0–100.0)
Platelets: 179 10*3/uL (ref 150–400)
RBC: 1.69 MIL/uL — ABNORMAL LOW (ref 3.87–5.11)
RDW: 14.6 % (ref 11.5–15.5)
WBC: 6.8 10*3/uL (ref 4.0–10.5)
nRBC: 0 % (ref 0.0–0.2)

## 2019-07-05 LAB — SARS CORONAVIRUS 2 (TAT 6-24 HRS): SARS Coronavirus 2: NEGATIVE

## 2019-07-05 MED ORDER — SPIRONOLACTONE 100 MG PO TABS
200.0000 mg | ORAL_TABLET | Freq: Every day | ORAL | Status: DC
  Administered 2019-07-05: 17:00:00 200 mg via ORAL
  Filled 2019-07-05: qty 2

## 2019-07-05 MED ORDER — PEG 3350-KCL-NA BICARB-NACL 420 G PO SOLR
4000.0000 mL | Freq: Once | ORAL | Status: AC
  Administered 2019-07-05: 4000 mL via ORAL
  Filled 2019-07-05: qty 4000

## 2019-07-05 MED ORDER — SODIUM CHLORIDE 0.9 % IV SOLN: INTRAVENOUS | Status: DC

## 2019-07-05 MED ORDER — FUROSEMIDE 80 MG PO TABS
80.0000 mg | ORAL_TABLET | Freq: Every day | ORAL | Status: DC
  Administered 2019-07-05: 80 mg via ORAL
  Filled 2019-07-05: qty 1

## 2019-07-05 NOTE — Progress Notes (Signed)
CRITICAL VALUE ALERT  Critical Value:  Hgb 6.6  Date & Time Notied:  07/05/19 @1448   Provider Notified: Dr. Carol Ada  Orders Received/Actions taken: awaiting orders

## 2019-07-05 NOTE — Anesthesia Preprocedure Evaluation (Addendum)
Anesthesia Evaluation  Patient identified by MRN, date of birth, ID band Patient awake    Reviewed: Allergy & Precautions, NPO status , Patient's Chart, lab work & pertinent test results  Airway Mallampati: II  TM Distance: >3 FB Neck ROM: Full    Dental  (+) Dental Advisory Given, Poor Dentition, Chipped, Missing   Pulmonary sleep apnea and Continuous Positive Airway Pressure Ventilation , Current Smoker,    Pulmonary exam normal breath sounds clear to auscultation       Cardiovascular hypertension, Pt. on medications Normal cardiovascular exam Rhythm:Regular Rate:Normal     Neuro/Psych negative neurological ROS     GI/Hepatic negative GI ROS, (+) Cirrhosis     substance abuse  alcohol use,   Endo/Other  diabetesMorbid obesity  Renal/GU Renal disease (AKI)     Musculoskeletal  (+) Arthritis ,   Abdominal   Peds  Hematology  (+) Blood dyscrasia, anemia ,   Anesthesia Other Findings Day of surgery medications reviewed with the patient.  Reproductive/Obstetrics                            Anesthesia Physical Anesthesia Plan  ASA: IV  Anesthesia Plan: MAC   Post-op Pain Management:    Induction:   PONV Risk Score and Plan: 1 and Propofol infusion and Treatment may vary due to age or medical condition  Airway Management Planned: Natural Airway and Nasal Cannula  Additional Equipment:   Intra-op Plan:   Post-operative Plan:   Informed Consent: I have reviewed the patients History and Physical, chart, labs and discussed the procedure including the risks, benefits and alternatives for the proposed anesthesia with the patient or authorized representative who has indicated his/her understanding and acceptance.     Dental advisory given  Plan Discussed with: CRNA  Anesthesia Plan Comments:        Anesthesia Quick Evaluation

## 2019-07-05 NOTE — H&P (Signed)
Stacey Monroe HPI: This is a 59 year old female with a PMH of ETOH cirrhosis, DM, hyperlipidemia, obesity, and HTN admitted for decompensated cirrhosis.  She started to have lower extremity swelling several weeks ago and it was initially unilateral.  Work up for a DVT was negative, but then she developed bilateral LE swelling.  Her weight increased from 235 lbs up to 260 lbs.  Over a one week time period, on a <2 gram sodium diet her weight continued to increase up by 4 lbs.  She was treated with Step II diuretics.  On this regimen her sodium declined, but she was not symptomatic.  Her lower extremities are painful as a result of the swelling.  Her HGB also dropped from 14.1 g/dL the end of November 2020 down to 9./2 g/dL on 06/25/2019.  She denied any hematochezia, melena, diarrhea, or constipation. Her routine EGD in 2018 was significant for small esophageal varices and portal HTN.  Over the years the patient was recommended to undergo a colonoscopy, but she declined multiple times.  Her current HGB is now at 6.6 g/dL.  Past Medical History:  Diagnosis Date  . Chronic diastolic heart failure ([place])   . Chronic musculoskeletal pain   . Diabetes mellitus without complication ([place])   . Hyperlipidemia   . Hypertension   . Low back pain   . OSA on CPAP   . Osteoarthritis    knees  . Pre-eclampsia   . Sleep apnea, obstructive    patient denies  . Slipped cervical disc   . SOB (shortness of breath)     Past Surgical History:  Procedure Laterality Date  . CESAREAN SECTION    . TUBAL LIGATION      History reviewed. No pertinent family history.  Social History:  reports that she has been smoking cigarettes. She has a 30.00 pack-year smoking history. She has never used smokeless tobacco. She reports that she does not drink alcohol or use drugs.  Allergies:  Allergies  Allergen Reactions  . Penicillin G Other (See Comments)    other    Medications:  Scheduled: . furosemide  80 mg  Oral Daily  . spironolactone  200 mg Oral Daily   Continuous:   Results for orders placed or performed during the hospital encounter of 07/05/19 (from the past 24 hour(s))  Comprehensive metabolic panel     Status: Abnormal   Collection Time: 07/05/19  2:15 PM  Result Value Ref Range   Sodium 124 (L) 135 - 145 mmol/L   Potassium 4.2 3.5 - 5.1 mmol/L   Chloride 85 (L) 98 - 111 mmol/L   CO2 28 22 - 32 mmol/L   Glucose, Bld 102 (H) 70 - 99 mg/dL   BUN 22 (H) 6 - 20 mg/dL   Creatinine, Ser 1.54 (H) 0.44 - 1.00 mg/dL   Calcium 8.7 (L) 8.9 - 10.3 mg/dL   Total Protein 7.1 6.5 - 8.1 g/dL   Albumin 2.3 (L) 3.5 - 5.0 g/dL   AST 84 (H) 15 - 41 U/L   ALT 31 0 - 44 U/L   Alkaline Phosphatase 96 38 - 126 U/L   Total Bilirubin 3.4 (H) 0.3 - 1.2 mg/dL   GFR calc non Af Amer 37 (L) >60 mL/min   GFR calc Af Amer 42 (L) >60 mL/min   Anion gap 11 5 - 15  CBC     Status: Abnormal   Collection Time: 07/05/19  2:15 PM  Result Value Ref Range  WBC 6.8 4.0 - 10.5 K/uL   RBC 1.69 (L) 3.87 - 5.11 MIL/uL   Hemoglobin 6.6 (LL) 12.0 - 15.0 g/dL   HCT 19.7 (L) 36.0 - 46.0 %   MCV 116.6 (H) 80.0 - 100.0 fL   MCH 39.1 (H) 26.0 - 34.0 pg   MCHC 33.5 30.0 - 36.0 g/dL   RDW 14.6 11.5 - 15.5 %   Platelets 179 150 - 400 K/uL   nRBC 0.0 0.0 - 0.2 %     No results found.  ROS:  As stated above in the HPI otherwise negative.  Blood pressure (!) 117/44, pulse 93, temperature 98.3 F (36.8 C), temperature source Oral, resp. rate 18, height 5\' 8"  (1.727 m), weight 121.1 kg, SpO2 100 %.    PE: Gen: NAD, Alert and Oriented HEENT:  [place]/AT, EOMI Neck: Supple, no LAD Lungs: CTA Bilaterally CV: RRR without M/G/R ABM: Soft, NTND, +BS Ext: 4+ edema  Assessment/Plan: 1) Decompensated cirrhosis. 2) LE swelling. 3) Anemia. 4) Hyponatremia. 5) Renal insufficiency.   With further questioning the patient does reports some mild intermittent black stools several weeks ago.  Further evaluation with an  EGD/Colonoscopy will be performed tomorrow.  Also, renal assistance will be required for her diuretics in order to correct her serum sodium while decreasing her edema.  Plan: 1) EGD/colonosscopy tomorrow. 2) ABM ultrasound. 3) Renal consultation in the AM.   Maliik Karner D 07/05/2019, 3:55 PM

## 2019-07-05 NOTE — Plan of Care (Signed)
  Problem: Education: Goal: Knowledge of General Education information will improve Description Including pain rating scale, medication(s)/side effects and non-pharmacologic comfort measures Outcome: Progressing   

## 2019-07-06 ENCOUNTER — Inpatient Hospital Stay (HOSPITAL_COMMUNITY): Payer: Medicare Other | Admitting: Anesthesiology

## 2019-07-06 ENCOUNTER — Encounter (HOSPITAL_COMMUNITY)
Admission: AD | Disposition: A | Discharge status: Home / Self Care | Payer: Self-pay | Source: Home / Self Care | Attending: Gastroenterology

## 2019-07-06 ENCOUNTER — Inpatient Hospital Stay (HOSPITAL_COMMUNITY): Payer: Medicare Other

## 2019-07-06 ENCOUNTER — Encounter (HOSPITAL_COMMUNITY): Payer: Self-pay | Admitting: Gastroenterology

## 2019-07-06 HISTORY — PX: COLONOSCOPY WITH PROPOFOL: SHX5780

## 2019-07-06 HISTORY — PX: POLYPECTOMY: SHX5525

## 2019-07-06 HISTORY — PX: ESOPHAGOGASTRODUODENOSCOPY (EGD) WITH PROPOFOL: SHX5813

## 2019-07-06 HISTORY — PX: HOT HEMOSTASIS: SHX5433

## 2019-07-06 LAB — URINALYSIS, ROUTINE W REFLEX MICROSCOPIC
Bilirubin Urine: NEGATIVE
Glucose, UA: NEGATIVE mg/dL
Hgb urine dipstick: NEGATIVE
Ketones, ur: NEGATIVE mg/dL
Nitrite: NEGATIVE
Protein, ur: NEGATIVE mg/dL
Specific Gravity, Urine: 1.009 (ref 1.005–1.030)
pH: 8 (ref 5.0–8.0)

## 2019-07-06 LAB — CBC
HCT: 18.3 % — ABNORMAL LOW (ref 36.0–46.0)
Hemoglobin: 6.1 g/dL — CL (ref 12.0–15.0)
MCH: 38.6 pg — ABNORMAL HIGH (ref 26.0–34.0)
MCHC: 33.3 g/dL (ref 30.0–36.0)
MCV: 115.8 fL — ABNORMAL HIGH (ref 80.0–100.0)
Platelets: 150 10*3/uL (ref 150–400)
RBC: 1.58 MIL/uL — ABNORMAL LOW (ref 3.87–5.11)
RDW: 14.5 % (ref 11.5–15.5)
WBC: 5.5 10*3/uL (ref 4.0–10.5)
nRBC: 0 % (ref 0.0–0.2)

## 2019-07-06 LAB — COMPREHENSIVE METABOLIC PANEL
ALT: 27 U/L (ref 0–44)
AST: 76 U/L — ABNORMAL HIGH (ref 15–41)
Albumin: 2.1 g/dL — ABNORMAL LOW (ref 3.5–5.0)
Alkaline Phosphatase: 77 U/L (ref 38–126)
Anion gap: 11 (ref 5–15)
BUN: 18 mg/dL (ref 6–20)
CO2: 27 mmol/L (ref 22–32)
Calcium: 8.2 mg/dL — ABNORMAL LOW (ref 8.9–10.3)
Chloride: 83 mmol/L — ABNORMAL LOW (ref 98–111)
Creatinine, Ser: 1.3 mg/dL — ABNORMAL HIGH (ref 0.44–1.00)
GFR calc Af Amer: 52 mL/min — ABNORMAL LOW (ref >60)
GFR calc non Af Amer: 45 mL/min — ABNORMAL LOW (ref >60)
Glucose, Bld: 107 mg/dL — ABNORMAL HIGH (ref 70–99)
Potassium: 3.7 mmol/L (ref 3.5–5.1)
Sodium: 121 mmol/L — ABNORMAL LOW (ref 135–145)
Total Bilirubin: 2.9 mg/dL — ABNORMAL HIGH (ref 0.3–1.2)
Total Protein: 6.5 g/dL (ref 6.5–8.1)

## 2019-07-06 LAB — PREPARE RBC (CROSSMATCH)

## 2019-07-06 LAB — SODIUM, URINE, RANDOM: Sodium, Ur: 54 mmol/L

## 2019-07-06 LAB — CREATININE, URINE, RANDOM: Creatinine, Urine: 71.33 mg/dL

## 2019-07-06 LAB — OSMOLALITY, URINE: Osmolality, Ur: 298 mOsm/kg — ABNORMAL LOW (ref 300–900)

## 2019-07-06 LAB — ABO/RH: ABO/RH(D): B POS

## 2019-07-06 SURGERY — COLONOSCOPY WITH PROPOFOL
Anesthesia: Monitor Anesthesia Care

## 2019-07-06 MED ORDER — PROPOFOL 10 MG/ML IV BOLUS
INTRAVENOUS | Status: DC | PRN
  Administered 2019-07-06: 10 mg via INTRAVENOUS
  Administered 2019-07-06 (×3): 30 mg via INTRAVENOUS
  Administered 2019-07-06 (×2): 20 mg via INTRAVENOUS

## 2019-07-06 MED ORDER — ALBUMIN HUMAN 25 % IV SOLN: 25.0000 g | Freq: Once | INTRAVENOUS | Status: DC

## 2019-07-06 MED ORDER — LACTATED RINGERS IV SOLN
INTRAVENOUS | Status: DC
  Administered 2019-07-06: 1000 mL via INTRAVENOUS

## 2019-07-06 MED ORDER — PHENOL 1.4 % MT LIQD
1.0000 | OROMUCOSAL | Status: DC | PRN
  Administered 2019-07-06: 23:00:00 1 via OROMUCOSAL
  Filled 2019-07-06: qty 177

## 2019-07-06 MED ORDER — PHENYLEPHRINE 40 MCG/ML (10ML) SYRINGE FOR IV PUSH (FOR BLOOD PRESSURE SUPPORT)
PREFILLED_SYRINGE | INTRAVENOUS | Status: DC | PRN
  Administered 2019-07-06 (×2): 80 ug via INTRAVENOUS

## 2019-07-06 MED ORDER — ALBUMIN HUMAN 25 % IV SOLN
25.0000 g | Freq: Three times a day (TID) | INTRAVENOUS | Status: AC
  Administered 2019-07-06 – 2019-07-07 (×4): 25 g via INTRAVENOUS
  Filled 2019-07-06 (×4): qty 100

## 2019-07-06 MED ORDER — PROPOFOL 500 MG/50ML IV EMUL
INTRAVENOUS | Status: DC | PRN
  Administered 2019-07-06: 75 ug/kg/min via INTRAVENOUS

## 2019-07-06 MED ORDER — FUROSEMIDE 10 MG/ML IJ SOLN
40.0000 mg | Freq: Two times a day (BID) | INTRAMUSCULAR | Status: DC
  Administered 2019-07-06 – 2019-07-08 (×4): 40 mg via INTRAVENOUS
  Filled 2019-07-06 (×4): qty 4

## 2019-07-06 MED ORDER — SODIUM CHLORIDE 0.9% IV SOLUTION: Freq: Once | INTRAVENOUS | Status: DC

## 2019-07-06 MED ORDER — GLUCAGON HCL RDNA (DIAGNOSTIC) 1 MG IJ SOLR
INTRAMUSCULAR | Status: DC | PRN
  Administered 2019-07-06: .5 mg via INTRAVENOUS

## 2019-07-06 MED ORDER — BUTAMBEN-TETRACAINE-BENZOCAINE 2-2-14 % EX AERO
INHALATION_SPRAY | CUTANEOUS | Status: DC | PRN
  Administered 2019-07-06: 2 via TOPICAL

## 2019-07-06 SURGICAL SUPPLY — 25 items

## 2019-07-06 NOTE — Op Note (Signed)
[facility] Patient Name: Stacey Monroe Procedure Date : 07/06/2019 MRN: [id number] Attending MD: Carol Ada , MD Date of Birth: 1961/02/11 CSN: [id number] Age: 59 Admit Type: Inpatient Procedure:                Upper GI endoscopy Indications:              Iron deficiency anemia Providers:                Carol Ada, MD, Benay Pillow, RN, William Dalton, Technician, Theodora Blow, Technician Referring MD:              Medicines:                 Complications:            No immediate complications. Estimated Blood Loss:     Estimated blood loss: none. Procedure:                Pre-Anesthesia Assessment:                           - Prior to the procedure, a History and Physical                            was performed, and patient medications and                            allergies were reviewed. The patient's tolerance of                            previous anesthesia was also reviewed. The risks                            and benefits of the procedure and the sedation                            options and risks were discussed with the patient.                            All questions were answered, and informed consent                            was obtained. Prior Anticoagulants: The patient has                            taken no previous anticoagulant or antiplatelet                            agents. ASA Grade Assessment: III - A patient with                            severe systemic disease. After reviewing the risks  and benefits, the patient was deemed in                            satisfactory condition to undergo the procedure.                           - Sedation was administered by an anesthesia                            professional. Deep sedation was attained.                           After obtaining informed consent, the endoscope was                            passed under direct vision.  Throughout the                            procedure, the patient's blood pressure, pulse, and                            oxygen saturations were monitored continuously. The                            PCF-H190DL ([id number] Olympus pediatric colonscope                            was introduced through the mouth, and advanced to                            the second part of duodenum. The upper GI endoscopy                            was accomplished without difficulty. The patient                            tolerated the procedure well. Scope In: Scope Out: Findings:      Small (< 5 mm) varices were found in the lower third of the esophagus.      Mild gastric antral vascular ectasia without bleeding was present in the       gastric antrum. Coagulation for tissue destruction using monopolar probe       was successful. Estimated blood loss: none.      Mild portal hypertensive gastropathy was found in the gastric fundus and       in the gastric body.      The examined duodenum was normal.      There was no evidence of any fundic varices. APC was successfully       applied to the Tatums. Impression:               - Small (< 5 mm) esophageal varices.                           - Gastric antral vascular ectasia without bleeding.  Treated with a monopolar probe.                           - Portal hypertensive gastropathy.                           - Normal examined duodenum.                           - No specimens collected. Recommendation:           - Return patient to hospital ward for ongoing care.                           - Low sodium diet.                           - Continue present medications.                           - Repeat upper endoscopy in 4 weeks for retreatment. Procedure Code(s):        --- Professional ---                           [phone], Esophagogastroduodenoscopy, flexible,                            transoral; with ablation of tumor(s), polyp(s), or                             other lesion(s) (includes pre- and post-dilation                            and guide wire passage, when performed) Diagnosis Code(s):        --- Professional ---                           I85.00, Esophageal varices without bleeding                           K31.819, Angiodysplasia of stomach and duodenum                            without bleeding                           K76.6, Portal hypertension                           K31.89, Other diseases of stomach and duodenum                           D50.9, Iron deficiency anemia, unspecified CPT copyright 2019 American [employer] Association. All rights reserved. The codes documented in this report are preliminary and upon coder review may  be revised to meet current compliance requirements. Carol Ada, MD Carol Ada, MD 07/06/2019 2:45:15 PM This report has been signed electronically. Number of Addenda: 0

## 2019-07-06 NOTE — Consult Note (Signed)
[facility] Renal Consultation Note  Requesting MD: Dr.Hung Indication for Consultation: Hyponatremia  HPI:  Stacey Monroe is a 59 y.o. female w/ PMH of alcoholic cirrhosis, DM, HLD, HTN, obesity presenting with decompensated cirrhosis. She was in her usual state of health until couple weeks ago when she developed bilateral lower extremity edema. She was started on furosemide and spironolactone. She also developed worsening acute anemia with hgb decline down tto 6.6 from 14.1. She was admitted for acute anemia due to GI bleed. Nephrology consulted for hyponatremia and acute kidney injury.  Stacey Monroe was examined and evaluated at bedside this am. She was observed resting comfortably in bed. She mentions having poor night of sleep due to her bowel prep. Mentions that she has been on both furosemide and spirolactone in the past but her lower extremity edema worsened despite taking her diuretics as directed. She also mentions having poor appetite and oral intake over the last 3 weeks. She denies any fevers, chills, nausea, vomiting, chest pain, palpitations.  Creatinine, Ser  Date/Time Value Ref Range Status  07/06/2019 03:05 AM 1.30 (H) 0.44 - 1.00 mg/dL Final  07/05/2019 02:15 PM 1.54 (H) 0.44 - 1.00 mg/dL Final  11/05/2009 04:03 AM 0.71 0.4 - 1.2 mg/dL Final  11/04/2009 05:45 AM 0.73 0.4 - 1.2 mg/dL Final  11/03/2009 05:15 AM 0.77 0.4 - 1.2 mg/dL Final  11/02/2009 03:46 AM 0.73 0.4 - 1.2 mg/dL Final  11/01/2009 05:00 AM 0.74 0.4 - 1.2 mg/dL Final  10/31/2009 05:38 AM 0.82 0.4 - 1.2 mg/dL Final  10/30/2009 05:09 PM 0.68 0.4 - 1.2 mg/dL Final  10/30/2009 05:20 AM 0.68 0.4 - 1.2 mg/dL Final  10/29/2009 09:16 PM 0.91 0.4 - 1.2 mg/dL Final   PMHx:   Past Medical History:  Diagnosis Date  . Chronic diastolic heart failure ([place])   . Chronic musculoskeletal pain   . Diabetes mellitus without complication ([place])   . Hyperlipidemia   . Hypertension   . Low back pain   . OSA  on CPAP   . Osteoarthritis    knees  . Pre-eclampsia   . Sleep apnea, obstructive    patient denies  . Slipped cervical disc   . SOB (shortness of breath)     Past Surgical History:  Procedure Laterality Date  . CESAREAN SECTION    . TUBAL LIGATION      Family Hx: History reviewed. No pertinent family history.  Social History:  reports that she has been smoking cigarettes. She has a 30.00 pack-year smoking history. She has never used smokeless tobacco. She reports that she does not drink alcohol or use drugs.  Allergies:  Allergies  Allergen Reactions  . Penicillin G Hives    Did it involve swelling of the face/tongue/throat, SOB, or low BP?No Did it involve sudden or severe rash/hives, skin peeling, or any reaction on the inside of your mouth or nose? Yes Did you need to seek medical attention at a hospital or doctor's office? No When did it last happen?Child If all above answers are "NO", may proceed with cephalosporin use.    Medications: Prior to Admission medications   Medication Sig Start Date End Date Taking? Authorizing Provider  furosemide (LASIX) 40 MG tablet Take 40 mg by mouth daily. 04/03/16  Yes [provider]  omeprazole (PRILOSEC) 10 MG capsule Take 10 mg by mouth daily.   Yes [provider]  spironolactone (ALDACTONE) 100 MG tablet Take 100 mg by mouth daily. 04/05/16  Yes [provider]  zolpidem (AMBIEN) 10 MG tablet Take 10 mg by mouth at bedtime. 03/24/16  Yes [provider]   Labs: CBC Latest Ref Rng & Units 07/06/2019 07/05/2019 10/31/2009  WBC 4.0 - 10.5 K/uL 5.5 6.8 14.3(H)  Hemoglobin 12.0 - 15.0 g/dL 6.1(LL) 6.6(LL) 10.8(L)  Hematocrit 36.0 - 46.0 % 18.3(L) 19.7(L) 30.8(L)  Platelets 150 - 400 K/uL 150 179 136(L)   CMP Latest Ref Rng & Units 07/06/2019 07/05/2019 11/05/2009  Glucose 70 - 99 mg/dL 107(H) 102(H) 91  BUN 6 - 20 mg/dL 18 22(H) 6  Creatinine 0.44 - 1.00 mg/dL 1.30(H) 1.54(H) 0.71  Sodium 135 - 145  mmol/L 121(L) 124(L) 125(L)  Potassium 3.5 - 5.1 mmol/L 3.7 4.2 5.0  Chloride 98 - 111 mmol/L 83(L) 85(L) 98  CO2 22 - 32 mmol/L 27 28 22   Calcium 8.9 - 10.3 mg/dL 8.2(L) 8.7(L) 8.2(L)  Total Protein 6.5 - 8.1 g/dL 6.5 7.1 -  Total Bilirubin 0.3 - 1.2 mg/dL 2.9(H) 3.4(H) -  Alkaline Phos 38 - 126 U/L 77 96 -  AST 15 - 41 U/L 76(H) 84(H) -  ALT 0 - 44 U/L 27 31 -   ROS:  Pertinent items noted in HPI and remainder of comprehensive ROS otherwise negative.  Physical Exam: Vitals:   07/06/19 0950 07/06/19 1012  BP: (!) 109/48 (!) 104/43  Pulse: 85 82  Resp: 18 16  Temp: 98.5 F (36.9 C) 98.7 F (37.1 C)  SpO2: 98% 98%    Gen: Well-developed, morbidly obese, NAD HEENT: NCAT head, hearing intact, EOMI Neck: supple, ROM intact CV: RRR, S1, S2 normal Pulm: Anterior wheezing, no respiratory distress Abd: Soft, BS+, Distended w/ hepatomegaly, non-tender Extm: ROM intact, Peripheral pulses intact, 3+ lower extremity edema extending up to mid thighs Skin: Dry, Warm, normal turgor, no rashes, lesions, wounds.  Neuro: AAOx3  Assessment/Plan:  1. Hypervolemic Hyponatremia: Likely due volume overload in setting of decompensated cirrhosis. Presumed to be 3rd spacing due to hypoalbuminemia (albumin 2.1). Will check urine labs to assess for intravascular depletion and give albumin. - Check urine na, urea, creatinine - Holding diuretics for today - Albumin infusion if FEna low  2. Acute Kidney Injury: Baseline BUN 6, Creatinine 0.71. Interval improvement since admission from 1.54 -> 1.30 with diuretic overnight. Unclear I&Os.  - Trend renal function - Avoid nephrotoxic meds - Strict I&Os, daily weights  3. Hypertension/volume  - BP 104/43  4. Acute anemia due to GI bleed: Per Gi, likely due to GI bleed in setting of cirrhosis. Planned for EGD/Colonoscopy today. Am hgb 6.6->6.1 - Receivig 1 unit pRBC currently - Management per primary  Mosetta Anis 07/06/2019, 10:47 AM  Pager:  [phone]  Attending attestation to follow

## 2019-07-06 NOTE — Progress Notes (Signed)
Consult entered for IV restart due to patient complaints of pain.  When I entered room and discussed with patient she states that it only burns when being flushed and did not wish to have IV restarted at this time if IV is still working.  Currently has albumin infusing without pain or redness.  Bedside RN and patient aware that if pain becomes continuous or site changes in appearance that IV team consult will need to be reordered and IV restarted.  Patient agreeable.

## 2019-07-06 NOTE — Op Note (Signed)
[facility] Patient Name: Stacey Monroe Procedure Date : 07/06/2019 MRN: [id number] Attending MD: Carol Ada , MD Date of Birth: 28-May-1961 CSN: [id number] Age: 59 Admit Type: Inpatient Procedure:                Colonoscopy Indications:              Iron deficiency anemia Providers:                Carol Ada, MD, Benay Pillow, RN, William Dalton, Technician, Theodora Blow, Technician Referring MD:              Medicines:                Propofol per Anesthesia Complications:            No immediate complications. Estimated Blood Loss:     Estimated blood loss was minimal. Procedure:                Pre-Anesthesia Assessment:                           - Prior to the procedure, a History and Physical                            was performed, and patient medications and                            allergies were reviewed. The patient's tolerance of                            previous anesthesia was also reviewed. The risks                            and benefits of the procedure and the sedation                            options and risks were discussed with the patient.                            All questions were answered, and informed consent                            was obtained. Prior Anticoagulants: The patient has                            taken no previous anticoagulant or antiplatelet                            agents. ASA Grade Assessment: III - A patient with                            severe systemic disease. After reviewing the risks  and benefits, the patient was deemed in                            satisfactory condition to undergo the procedure.                           - Sedation was administered by an anesthesia                            professional. Deep sedation was attained.                           After obtaining informed consent, the colonoscope                            was passed under  direct vision. Throughout the                            procedure, the patient's blood pressure, pulse, and                            oxygen saturations were monitored continuously. The                            PCF-H190DL ([id number] Olympus pediatric colonscope                            was introduced through the anus and advanced to the                            the cecum, identified by appendiceal orifice and                            ileocecal valve. The colonoscopy was technically                            difficult and complex. The patient tolerated the                            procedure well. The quality of the bowel                            preparation was good. The ileocecal valve,                            appendiceal orifice, and rectum were photographed. Scope In: 1:49:55 PM Scope Out: 2:36:12 PM Scope Withdrawal Time: 0 hours 40 minutes 43 seconds  Total Procedure Duration: 0 hours 46 minutes 17 seconds  Findings:      Five sessile polyps were found in the sigmoid colon, transverse colon       and cecum. The polyps were 2 to 8 mm in size. These polyps were removed       with a cold snare. Resection and retrieval were complete.      Multiple large patchy angiodysplastic lesions without  bleeding were       found in the ascending colon and in the cecum. Coagulation for tissue       destruction using monopolar probe was successful.      Scattered small and large-mouthed diverticula were found in the sigmoid       colon.      The procedure was difficult as the there were respiratory issues.       Anesthesia provided a constant open airway. Her colon was       hypercontractile and she coughed repeatedly. This made the polypectomies       difficult. In the cecum and ascending colon, she was several large       swaths of vascular abnormalities. The gross findings were more       consistent with barotrauma with colonoscope insertion, but the cecum was       easily  intubated within 2-3 minutes. It was concluded that these were       true vascular abnormalities. The largest patch, which was 3 cm in size       was located in the cecum. Three classic AVMs were noted on the ICV and       in the ascending colon. All vascular abnormalities were successfully       treated with APC using the right colon setting for the ablation. Impression:               - Five 2 to 8 mm polyps in the sigmoid colon, in                            the transverse colon and in the cecum, removed with                            a cold snare. Resected and retrieved.                           - Multiple non-bleeding colonic angiodysplastic                            lesions. Treated with a monopolar probe.                           - Diverticulosis in the sigmoid colon. Recommendation:           - Return patient to hospital ward for ongoing care.                           - Low sodium diet.                           - Continue present medications.                           - Await pathology results.                           - Repeat colonoscopy (date not yet determined) for                            retreatment pending  her HGB values. If her HGB                            stays stable and/or improves, then she will have a                            follow up routine colonoscopy in 3 years. Procedure Code(s):        --- Professional ---                           [phone], Colonoscopy, flexible; with ablation of                            tumor(s), polyp(s), or other lesion(s) (includes                            pre- and post-dilation and guide wire passage, when                            performed)                           [id number], 59, Colonoscopy, flexible; with removal of                            tumor(s), polyp(s), or other lesion(s) by snare                            technique Diagnosis Code(s):        --- Professional ---                           K63.5, Polyp of colon                            K55.20, Angiodysplasia of colon without hemorrhage                           D50.9, Iron deficiency anemia, unspecified                           K57.30, Diverticulosis of large intestine without                            perforation or abscess without bleeding CPT copyright 2019 American Medical Association. All rights reserved. The codes documented in this report are preliminary and upon coder review may  be revised to meet current compliance requirements. Carol Ada, MD Carol Ada, MD 07/06/2019 2:53:54 PM This report has been signed electronically. Number of Addenda: 0

## 2019-07-06 NOTE — Progress Notes (Signed)
Call placed to Dr. Gifford Shave [facility] for Endoscopy today to discuss hgb 6.1. Per Dr. Gifford Shave patient will need to be type and crossed and have 2 units available in blood bank prior to procedure. Call placed to Dr. Benson Norway to advise. Procedure moved to 1345 today to allow for this preparation.

## 2019-07-07 ENCOUNTER — Inpatient Hospital Stay (HOSPITAL_COMMUNITY): Payer: Medicare Other

## 2019-07-07 DIAGNOSIS — R601 Generalized edema: Secondary | ICD-10-CM

## 2019-07-07 DIAGNOSIS — K7460 Unspecified cirrhosis of liver: Secondary | ICD-10-CM

## 2019-07-07 DIAGNOSIS — K7290 Hepatic failure, unspecified without coma: Secondary | ICD-10-CM

## 2019-07-07 DIAGNOSIS — I340 Nonrheumatic mitral (valve) insufficiency: Secondary | ICD-10-CM

## 2019-07-07 LAB — COMPREHENSIVE METABOLIC PANEL
ALT: 24 U/L (ref 0–44)
AST: 64 U/L — ABNORMAL HIGH (ref 15–41)
Albumin: 2.6 g/dL — ABNORMAL LOW (ref 3.5–5.0)
Alkaline Phosphatase: 61 U/L (ref 38–126)
Anion gap: 11 (ref 5–15)
BUN: 13 mg/dL (ref 6–20)
CO2: 26 mmol/L (ref 22–32)
Calcium: 8.3 mg/dL — ABNORMAL LOW (ref 8.9–10.3)
Chloride: 87 mmol/L — ABNORMAL LOW (ref 98–111)
Creatinine, Ser: 0.9 mg/dL (ref 0.44–1.00)
GFR calc Af Amer: >60 mL/min (ref >60)
GFR calc non Af Amer: >60 mL/min (ref >60)
Glucose, Bld: 109 mg/dL — ABNORMAL HIGH (ref 70–99)
Potassium: 3.7 mmol/L (ref 3.5–5.1)
Sodium: 124 mmol/L — ABNORMAL LOW (ref 135–145)
Total Bilirubin: 4.7 mg/dL — ABNORMAL HIGH (ref 0.3–1.2)
Total Protein: 6.4 g/dL — ABNORMAL LOW (ref 6.5–8.1)

## 2019-07-07 LAB — UREA NITROGEN, URINE: Urea Nitrogen, Ur: 425 mg/dL

## 2019-07-07 LAB — HEMOGLOBIN AND HEMATOCRIT, BLOOD
HCT: 20.9 % — ABNORMAL LOW (ref 36.0–46.0)
Hemoglobin: 7.2 g/dL — ABNORMAL LOW (ref 12.0–15.0)

## 2019-07-07 LAB — ECHOCARDIOGRAM COMPLETE
Height: 68 in
Weight: 4313.96 oz

## 2019-07-07 MED ORDER — ACETAMINOPHEN 325 MG PO TABS
650.0000 mg | ORAL_TABLET | Freq: Three times a day (TID) | ORAL | Status: DC | PRN
  Administered 2019-07-07 – 2019-07-08 (×3): 650 mg via ORAL
  Filled 2019-07-07 (×3): qty 2

## 2019-07-07 MED ORDER — SODIUM CHLORIDE 0.9 % IV SOLN
8.0000 mg | Freq: Four times a day (QID) | INTRAVENOUS | Status: DC | PRN
  Filled 2019-07-07: qty 4

## 2019-07-07 MED ORDER — BISMUTH SUBSALICYLATE 262 MG/15ML PO SUSP
30.0000 mL | ORAL | Status: DC | PRN
  Filled 2019-07-07: qty 236

## 2019-07-07 MED ORDER — ONDANSETRON HCL 4 MG/2ML IJ SOLN
4.0000 mg | Freq: Four times a day (QID) | INTRAMUSCULAR | Status: DC
  Administered 2019-07-07: 21:00:00 8 mg via INTRAVENOUS
  Filled 2019-07-07 (×2): qty 4

## 2019-07-07 MED ORDER — SPIRONOLACTONE 100 MG PO TABS
100.0000 mg | ORAL_TABLET | Freq: Every day | ORAL | Status: DC
  Administered 2019-07-07 – 2019-07-08 (×2): 100 mg via ORAL
  Filled 2019-07-07 (×2): qty 1

## 2019-07-07 MED ORDER — ONDANSETRON HCL 4 MG/2ML IJ SOLN: 4.0000 mg | Freq: Four times a day (QID) | INTRAMUSCULAR | Status: DC | PRN

## 2019-07-07 MED ORDER — ONDANSETRON HCL 4 MG/2ML IJ SOLN
4.0000 mg | Freq: Four times a day (QID) | INTRAMUSCULAR | Status: DC | PRN
  Administered 2019-07-07 – 2019-07-08 (×2): 4 mg via INTRAVENOUS
  Filled 2019-07-07: qty 2

## 2019-07-07 NOTE — Progress Notes (Signed)
  Echocardiogram 2D Echocardiogram has been performed.  Stacey Monroe 07/07/2019, 3:03 PM

## 2019-07-07 NOTE — Progress Notes (Signed)
[facility] Progress Note   Assessment/Plan: 1. Hypervolemic Hyponatremia: Presumed secondary to cirrhosis but r/o CHF -- TTE shows normal EF.  She's clearly volume overloaded but was developing AKI with diuretic administration so we gave albumin IV yesterday then resumed IV diuretics.  Na improved 121 to 124 this AM and creatinine improved.  Continue IV lasix today and agree with aldactone 100 daily resumed. Reiterated na/fluid restriction at home.  She will need close f/u with GI. I will sign off.  Would be happy to see her any time in the future should need arise.   2. Acute Kidney Injury: Baseline BUN 6, Creatinine 0.71. Admission from 1.54 -> 1.30 with diuretic overnight. She's improved to Cr 0.9 with albumin volume expansion and tolerated IV diuretic dose.  CTM.   3. Hypertension: BP low  4. Acute anemia due to GI bleed: s/p EGD and flex sig with therapy of non bleeding AVMs. Hb improved to 7.2 this AM.   Jannifer Hick MD 07/07/2019, 9:03 AM  [facility] Pager: [phone]     Subjective/Interval Events:   Wants to go home.  GI resumed aldactone.   Objective Vitals:   07/06/19 2117 07/06/19 2315 07/07/19 0426 07/07/19 0500  BP: (!) 110/40 (!) 110/42 (!) 119/46   Pulse: 88 88 92   Resp: 18 18 18    Temp: 98.4 F (36.9 C) 99.5 F (37.5 C) 99.3 F (37.4 C)   TempSrc: Oral Oral Oral   SpO2: 96% 98% 93%   Weight:    122.3 kg  Height:       Physical Exam General: chronically ill appearing Heart: RRR Lungs: clear Abdomen: soft, obese Extremities: 2+ pitting LE edema   Additional Objective Labs: Basic Metabolic Panel: Recent Labs  Lab 07/05/19 1415 07/06/19 0305 07/07/19 0218  NA 124* 121* 124*  K 4.2 3.7 3.7  CL 85* 83* 87*  CO2 28 27 26   GLUCOSE 102* 107* 109*  BUN 22* 18 13  CREATININE 1.54* 1.30* 0.90  CALCIUM 8.7* 8.2* 8.3*   Liver Function Tests: Recent Labs  Lab 07/05/19 1415 07/06/19 0305 07/07/19 0218  AST  84* 76* 64*  ALT 31 27 24   ALKPHOS 96 77 61  BILITOT 3.4* 2.9* 4.7*  PROT 7.1 6.5 6.4*  ALBUMIN 2.3* 2.1* 2.6*   No results for input(s): LIPASE, AMYLASE in the last 168 hours. CBC: Recent Labs  Lab 07/05/19 1415 07/06/19 0305 07/07/19 0218  WBC 6.8 5.5  --   HGB 6.6* 6.1* 7.2*  HCT 19.7* 18.3* 20.9*  MCV 116.6* 115.8*  --   PLT 179 150  --    Blood Culture    Component Value Date/Time   SDES URINE, CLEAN CATCH 10/30/2009 1754   SPECREQUEST NONE 10/30/2009 1754   CULT  10/30/2009 1754    Multiple bacterial morphotypes present, none predominant. Suggest appropriate recollection if clinically indicated.   REPTSTATUS 11/01/2009 FINAL 10/30/2009 1754    Cardiac Enzymes: No results for input(s): CKTOTAL, CKMB, CKMBINDEX, TROPONINI in the last 168 hours. CBG: No results for input(s): GLUCAP in the last 168 hours. Iron Studies: No results for input(s): IRON, TIBC, TRANSFERRIN, FERRITIN in the last 72 hours. @lablastinr3 @ Studies/Results: US Abdomen Complete  Result Date: 07/06/2019 CLINICAL DATA:  Cirrhosis. EXAM: ABDOMEN ULTRASOUND COMPLETE COMPARISON:  Ultrasound 01/03/2012. FINDINGS: Gallbladder: Exam is extremely limited due to patient's body habitus and bowel gas. Gallbladder not visualized. Common bile duct: Diameter: Not visualized. Liver: Liver is poorly visualized. Liver is echogenic with  a nodular contour consistent patient's cirrhosis noted. Evaluation for mass lesion difficult due to poor visualization of the liver. Reversal portal venous flow noted. IVC: No abnormality visualized. Pancreas: Visualized portion unremarkable. Spleen: Size and appearance within normal limits. Right Kidney: Length: 10.8 cm. Echogenicity within normal limits. No mass or hydronephrosis visualized. Left Kidney: Length: 10.9 cm. Echogenicity within normal limits. No mass or hydronephrosis visualized. Abdominal aorta: No aneurysm visualized. Other findings: None. IMPRESSION: 1. Extremely limited  exam due to patient's body habitus and bowel gas. Gallbladder and common bile duct not visualized. 2. Liver is poorly visualized. The liver is echogenic with a nodular contour consistent with the patient's known cirrhosis. Reversal of portal venous flow noted. Electronically Signed   By: Marcello Moores  Register   On: 07/06/2019 07:33   Medications: . albumin human Stopped (07/07/19 0219)  . lactated ringers 125 mL/hr at 07/07/19 0557   . sodium chloride   Intravenous Once  . furosemide  40 mg Intravenous BID

## 2019-07-07 NOTE — Progress Notes (Addendum)
Gastroenterology.  Covering for Dr. Benson Norway   Progress Note    ASSESSMENT AND PLAN:   59 yo female with etoh cirrhosis, DM, hyperlipidemia, obesity and HTN  1. Decompensated cirrhosis with volume overload. No varices on EGD.  --Volume overload: getting IV lasix 40 mg BID and IV albumin ITID. Not on Aldactone. Normal renal function. Weight actually up overnight from 118 to 122 kg. Only 500 ml output documented yesterday. Net balance + 1800 ml.  --Wants to go home. I have asked her to stay until tomorrow for continue IV diuresis.  --low sodium diet --daily weights  2. IDA.  Hg 6.6, down from baseline ~ 10.  --EGD / colonoscopy yesterday >> GAVE s/p monopolar probe, portal HTN. Colonoscopy  >> polyps ( path pending) and multiple non-bleeding angiodysplastic lesions. -Hgb improved 6.6 >>>  7.2 post 2 u blood.   SUBJECTIVE   Wants to go home. The bed is causing neck and back pain  OBJECTIVE:    07/06/19 EGD and colonoscopy for IDA Small (< 5 mm) esophageal varices. - Gastric antral vascular ectasia without bleeding. Treated with a monopolar probe. - Portal hypertensive gastropathy.  Colonoscopy : complete exam, good prep Five 2 to 8 mm polyps in the sigmoid colon, in the transverse colon and in the cecum, removed with a cold snare. Resected and retrieved. - Multiple non-bleeding colonic angiodysplastic lesions. Treated with a monopolar probe. - Diverticulosis in the sigmoid colon.   Vital signs in last 24 hours: Temp:  [97.6 F (36.4 C)-99.5 F (37.5 C)] 99.3 F (37.4 C) (01/09 0426) Pulse Rate:  [75-92] 92 (01/09 0426) Resp:  [14-20] 18 (01/09 0426) BP: (109-128)/(37-51) 119/46 (01/09 0426) SpO2:  [93 %-100 %] 93 % (01/09 0426) Weight:  [122.3 kg] 122.3 kg (01/09 0500) Last BM Date: 07/06/19 General:   Alert, in NAD Heart:  Regular rate and rhythm;  No lower extremity edema   Pulm: Normal respiratory effort   Abdomen:  Soft, nondistended, nontender.  Normal bowel  sounds.          Neurologic:  Alert and  oriented x4;  grossly normal neurologically. Psych:   Cooperative.  Crying.   Intake/Output from previous day: 01/08 0701 - 01/09 0700 In: 1347.2 [P.O.:240; I.V.:530; Blood:316; IV Piggyback:261.2] Out: 500 [Urine:500] Intake/Output this shift: No intake/output data recorded.  Lab Results: Recent Labs    07/05/19 1415 07/06/19 0305 07/07/19 0218  WBC 6.8 5.5  --   HGB 6.6* 6.1* 7.2*  HCT 19.7* 18.3* 20.9*  PLT 179 150  --    BMET Recent Labs    07/05/19 1415 07/06/19 0305 07/07/19 0218  NA 124* 121* 124*  K 4.2 3.7 3.7  CL 85* 83* 87*  CO2 28 27 26   GLUCOSE 102* 107* 109*  BUN 22* 18 13  CREATININE 1.54* 1.30* 0.90  CALCIUM 8.7* 8.2* 8.3*   LFT Recent Labs    07/07/19 0218  PROT 6.4*  ALBUMIN 2.6*  AST 64*  ALT 24  ALKPHOS 61  BILITOT 4.7*   PT/INR No results for input(s): LABPROT, INR in the last 72 hours. Hepatitis Panel No results for input(s): HEPBSAG, HCVAB, HEPAIGM, HEPBIGM in the last 72 hours.  US Abdomen Complete  Result Date: 07/06/2019 CLINICAL DATA:  Cirrhosis. EXAM: ABDOMEN ULTRASOUND COMPLETE COMPARISON:  Ultrasound 01/03/2012. FINDINGS: Gallbladder: Exam is extremely limited due to patient's body habitus and bowel gas. Gallbladder not visualized. Common bile duct: Diameter: Not visualized. Liver: Liver is poorly visualized. Liver is echogenic with a  nodular contour consistent patient's cirrhosis noted. Evaluation for mass lesion difficult due to poor visualization of the liver. Reversal portal venous flow noted. IVC: No abnormality visualized. Pancreas: Visualized portion unremarkable. Spleen: Size and appearance within normal limits. Right Kidney: Length: 10.8 cm. Echogenicity within normal limits. No mass or hydronephrosis visualized. Left Kidney: Length: 10.9 cm. Echogenicity within normal limits. No mass or hydronephrosis visualized. Abdominal aorta: No aneurysm visualized. Other findings: None.  IMPRESSION: 1. Extremely limited exam due to patient's body habitus and bowel gas. Gallbladder and common bile duct not visualized. 2. Liver is poorly visualized. The liver is echogenic with a nodular contour consistent with the patient's known cirrhosis. Reversal of portal venous flow noted. Electronically Signed   By: Marcello Moores  Register   On: 07/06/2019 07:33    Active Problems:   Decompensated hepatic cirrhosis ([place])     LOS: 2 days   Tye Savoy ,NP 07/07/2019, 12:41 PM  GI ATTENDING  Interval history and data reviewed.  Agree with interval progress note as outlined above.  Patient wanted to go home as stated.  Stable.  She is not on Aldactone.  I discussed this with Dr. Benson Norway, who does wish for her to be on Aldactone therapy.  Her BUN and creatinine are normal.  Anticipating discharge tomorrow on diuretic therapy with close outpatient follow-up with Dr. Benson Norway.  Docia Chuck. Geri Seminole., M.D. [facility] Division of Gastroenterology

## 2019-07-07 NOTE — Progress Notes (Signed)
Patient verbalized that she wants to be discharged. MD paged  an hour ago, still awaiting callback.

## 2019-07-08 LAB — BASIC METABOLIC PANEL
Anion gap: 13 (ref 5–15)
BUN: 9 mg/dL (ref 6–20)
CO2: 27 mmol/L (ref 22–32)
Calcium: 8.7 mg/dL — ABNORMAL LOW (ref 8.9–10.3)
Chloride: 85 mmol/L — ABNORMAL LOW (ref 98–111)
Creatinine, Ser: 1.08 mg/dL — ABNORMAL HIGH (ref 0.44–1.00)
GFR calc Af Amer: >60 mL/min (ref >60)
GFR calc non Af Amer: 56 mL/min — ABNORMAL LOW (ref >60)
Glucose, Bld: 108 mg/dL — ABNORMAL HIGH (ref 70–99)
Potassium: 3.6 mmol/L (ref 3.5–5.1)
Sodium: 125 mmol/L — ABNORMAL LOW (ref 135–145)

## 2019-07-08 MED ORDER — FUROSEMIDE 40 MG PO TABS: 40.0000 mg | ORAL_TABLET | Freq: Two times a day (BID) | ORAL | 0 refills | Status: DC

## 2019-07-08 NOTE — Discharge Planning (Signed)
Discharge Summary  Name: Stacey Monroe MRN: [id number] DOB: 05/09/1961 59 y.o. PCP:  Aletha Halim., PA-C  Date of Admission: 07/05/2019 12:24 PM Date of Discharge: 07/08/2019 Attending Physician: Carol Ada, MD  Discharge Diagnosis:  1. Decompensated cirrhosis  2. Macrocytic Anemia / GAVE / colonic angiodysplastic lesions.  Hgb low     at 7.2 but stable following 2 u PRBC.   3. Colon polyps, path pending  4. AKI, resolved  5. Tobacco Abuse.   Consultations:  [facility] for management of hyponatremia and AKI  Procedures Performed:  US Abdomen Complete  Result Date: 07/06/2019 CLINICAL DATA:  Cirrhosis. EXAM: ABDOMEN ULTRASOUND COMPLETE COMPARISON:  Ultrasound 01/03/2012. FINDINGS: Gallbladder: Exam is extremely limited due to patient's body habitus and bowel gas. Gallbladder not visualized. Common bile duct: Diameter: Not visualized. Liver: Liver is poorly visualized. Liver is echogenic with a nodular contour consistent patient's cirrhosis noted. Evaluation for mass lesion difficult due to poor visualization of the liver. Reversal portal venous flow noted. IVC: No abnormality visualized. Pancreas: Visualized portion unremarkable. Spleen: Size and appearance within normal limits. Right Kidney: Length: 10.8 cm. Echogenicity within normal limits. No mass or hydronephrosis visualized. Left Kidney: Length: 10.9 cm. Echogenicity within normal limits. No mass or hydronephrosis visualized. Abdominal aorta: No aneurysm visualized. Other findings: None. IMPRESSION: 1. Extremely limited exam due to patient's body habitus and bowel gas. Gallbladder and common bile duct not visualized. 2. Liver is poorly visualized. The liver is echogenic with a nodular contour consistent with the patient's known cirrhosis. Reversal of portal venous flow noted. Electronically Signed   By: Marcello Moores  Register   On: 07/06/2019 07:33   ECHOCARDIOGRAM COMPLETE  Result Date: 07/07/2019  ECHOCARDIOGRAM REPORT   Patient Name:   Stacey Monroe Date of Exam: 07/07/2019 Medical Rec #:  [id number]       Height:       68.0 in Accession #:    [id number]      Weight:       269.6 lb Date of Birth:  December 13, 1960       BSA:          2.32 m Patient Age:    59 years        BP:           119/46 mmHg Patient Gender: F               HR:           71 bpm. Exam Location:  Inpatient Procedure: 2D Echo, Cardiac Doppler and Color Doppler Indications:    R60.0 Edema  History:        Patient has prior history of Echocardiogram examinations, most                 recent 04/28/2016. CHF, Signs/Symptoms:Dyspnea; Risk                 Factors:Hypertension, Diabetes, Dyslipidemia and Sleep Apnea.  Sonographer:    Jonelle Sidle Dance Referring Phys: [phone] [facility] A KRUSKA  Sonographer Comments: Suboptimal subcostal window. IMPRESSIONS  1. Left ventricular ejection fraction, by visual estimation, is 60 to 65%. The left ventricle has normal function. There is no left ventricular hypertrophy.  2. Global right ventricle has normal systolic function.The right ventricular size is normal.  3. Left atrial size was normal.  4. Right atrial size was normal.  5. Mild mitral annular calcification.  6. The mitral valve is normal in structure. Mild mitral valve regurgitation. No evidence  of mitral stenosis.  7. The tricuspid valve is normal in structure.  8. The aortic valve is tricuspid. Aortic valve regurgitation is not visualized. No evidence of aortic valve sclerosis or stenosis.  9. The pulmonic valve was not well visualized. Pulmonic valve regurgitation is not visualized. 10. Normal LV function; mild MR. 11. The left ventricle has no regional wall motion abnormalities. FINDINGS  Left Ventricle: Left ventricular ejection fraction, by visual estimation, is 60 to 65%. The left ventricle has normal function. The left ventricle has no regional wall motion abnormalities. There is no left ventricular hypertrophy. Left ventricular diastolic parameters  were normal. Normal left atrial pressure. Right Ventricle: The right ventricular size is normal.Global RV systolic function is has normal systolic function. Left Atrium: Left atrial size was normal in size. Right Atrium: Right atrial size was normal in size Pericardium: There is no evidence of pericardial effusion. Mitral Valve: The mitral valve is normal in structure. Mild mitral annular calcification. Mild mitral valve regurgitation. No evidence of mitral valve stenosis by observation. Tricuspid Valve: The tricuspid valve is normal in structure. Tricuspid valve regurgitation is trivial. Aortic Valve: The aortic valve is tricuspid. Aortic valve regurgitation is not visualized. The aortic valve is structurally normal, with no evidence of sclerosis or stenosis. Pulmonic Valve: The pulmonic valve was not well visualized. Pulmonic valve regurgitation is not visualized. Pulmonic regurgitation is not visualized. Aorta: The aortic root is normal in size and structure. Venous: The inferior vena cava was not well visualized. IAS/Shunts: No atrial level shunt detected by color flow Doppler. Additional Comments: Normal LV function; mild MR.  LEFT VENTRICLE PLAX 2D LVIDd:         5.12 cm  Diastology LVIDs:         3.60 cm  LV e' lateral:   7.72 cm/s LV PW:         1.16 cm  LV E/e' lateral: 18.3 LV IVS:        0.78 cm  LV e' medial:    8.70 cm/s LVOT diam:     1.80 cm  LV E/e' medial:  16.2 LV SV:         71 ml LV SV Index:   28.43 LVOT Area:     2.54 cm  RIGHT VENTRICLE RV Basal diam:  3.01 cm RV Mid diam:    2.10 cm RV S prime:     12.80 cm/s TAPSE (M-mode): 2.6 cm LEFT ATRIUM              Index       RIGHT ATRIUM           Index LA diam:        4.80 cm  2.07 cm/m  RA Area:     17.60 cm LA Vol (A2C):   127.0 ml 54.73 ml/m RA Volume:   45.60 ml  19.65 ml/m LA Vol (A4C):   61.2 ml  26.37 ml/m LA Biplane Vol: 97.1 ml  41.85 ml/m  AORTIC VALVE LVOT Vmax:   111.00 cm/s LVOT Vmean:  77.800 cm/s LVOT VTI:    0.248 m  AORTA Ao  Root diam: 3.40 cm Ao Asc diam:  3.20 cm MITRAL VALVE                         TRICUSPID VALVE MV Area (PHT): 3.53 cm              TR Peak grad:   26.4 mmHg  MV PHT:        62.35 msec            TR Vmax:        257.00 cm/s MV Decel Time: 215 msec MV E velocity: 141.00 cm/s 103 cm/s  SHUNTS MV A velocity: 107.00 cm/s 70.3 cm/s Systemic VTI:  0.25 m MV E/A ratio:  1.32        1.5       Systemic Diam: 1.80 cm  Kirk Ruths MD Electronically signed by Kirk Ruths MD Signature Date/Time: 07/07/2019/3:08:30 PM    Final     GI Procedures:   1. EGD  07/06/19 for evaluation of anemia Findings : GAVE s/p monopolar probe, portal HTN.  2 Colonoscopy for evaluation of anemia.  Findings: polyps ( path pending) and multiple non-bleeding angiodysplastic lesions.   History/Physical Exam:  See Admission H&P  Admission HPI:  This is a 59 year old female with a PMH of ETOH cirrhosis, DM, hyperlipidemia, obesity, and HTN admitted for decompensated cirrhosis.  She started to have lower extremity swelling several weeks ago and it was initially unilateral.  Work up for a DVT was negative, but then she developed bilateral LE swelling.  Her weight increased from 235 lbs up to 260 lbs.  Over a one week time period, on a <2 gram sodium diet her weight continued to increase up by 4 lbs.  She was treated with Step II diuretics.  On this regimen her sodium declined, but she was not symptomatic.  Her lower extremities are painful as a result of the swelling.  Her HGB also dropped from 14.1 g/dL the end of November 2020 down to 9./2 g/dL on 06/25/2019.  She denied any hematochezia, melena, diarrhea, or constipation. Her routine EGD in 2018 was significant for small esophageal varices and portal HTN.  Over the years the patient was recommended to undergo a colonoscopy, but she declined multiple times.  Her current HGB is now at 6.6 g/dL.  Hospital Course by problem list: 1. Decompensated cirrhosis. Admitted with worsening  bilateral lower extremity edema despite diuretics and low salt diet. Also hgb noted to have declined. She was in AKI. Nephrology consulted, initially held diuretics and obtained urine studies as well as an echo to rule our CHF as cause of hypervolemia. EF was normal. Lassix was resumed, she was given IV albumin. Aldactone was supposed to be resumed but for unclear reasons it wasn't. Nephrology signed out as renal function improved. We covered for Dr. Benson Norway over the weekend and resumed Aldactone. Patient wanted to go hom, we convince her to stay until Sunday morning. On day of discharge she still had a significant amount of lower extremity edema but no otherwise felt okay. She was discharged home on diuretics, low salt diet and instructions to call Dr. Benson Norway for follow up.   2. Anemia / GAVE / colonic angiodysplastic lesions.  No overt bleeding. Hgb improved from 6.6 to only 7.2 after two units. She was asymptomatic at time of discharge. Further evaluation per primary GI Dr. Benson Norway.    3. Colon polyps, path pending. Surveillance colonoscopy to be determined by Dr Benson Norway  4. AKI, resolved. Diuretics resumed.   5. Tobacco abuse - no plans to discontinue smoking at this time  Discharge Vitals:  BP (!) 115/51 (BP Location: Right Arm)   Pulse 83   Temp 98.5 F (36.9 C) (Oral)   Resp 20   Ht 5\' 8"  (1.727 m)   Wt 122.8 kg  SpO2 99%   BMI 41.16 kg/m   Discharge Labs:  Results for orders placed or performed during the hospital encounter of 07/05/19 (from the past 24 hour(s))  Basic metabolic panel     Status: Abnormal   Collection Time: 07/08/19  2:11 AM  Result Value Ref Range   Sodium 125 (L) 135 - 145 mmol/L   Potassium 3.6 3.5 - 5.1 mmol/L   Chloride 85 (L) 98 - 111 mmol/L   CO2 27 22 - 32 mmol/L   Glucose, Bld 108 (H) 70 - 99 mg/dL   BUN 9 6 - 20 mg/dL   Creatinine, Ser 1.08 (H) 0.44 - 1.00 mg/dL   Calcium 8.7 (L) 8.9 - 10.3 mg/dL   GFR calc non Af Amer 56 (L) >60 mL/min   GFR calc Af Amer  >60 >60 mL/min   Anion gap 13 5 - 15    Disposition and follow-up:   Ms.Julie-Anne T Scheeler was discharged from [facility] in stable condition.    Follow-up Appointments: Discharge Instructions    Diet - low sodium heart healthy   Complete by: As directed    Increase activity slowly   Complete by: As directed       Discharge Medications: Allergies as of 07/08/2019      Reactions   Penicillin G Hives   Did it involve swelling of the face/tongue/throat, SOB, or low BP?No Did it involve sudden or severe rash/hives, skin peeling, or any reaction on the inside of your mouth or nose? Yes Did you need to seek medical attention at a hospital or doctor's office? No When did it last happen?Child If all above answers are "NO", may proceed with cephalosporin use.      Medication List    TAKE these medications   furosemide 40 MG tablet Commonly known as: LASIX Take 1 tablet (40 mg total) by mouth 2 (two) times daily. Call Carol Ada, MD for refills What changed:   when to take this  additional instructions   omeprazole 10 MG capsule Commonly known as: PRILOSEC Take 10 mg by mouth daily.   spironolactone 100 MG tablet Commonly known as: ALDACTONE Take 100 mg by mouth daily.   zolpidem 10 MG tablet Commonly known as: AMBIEN Take 10 mg by mouth at bedtime.       Signed: Tye Savoy 07/08/2019, 11:52 AM

## 2019-07-08 NOTE — Anesthesia Postprocedure Evaluation (Signed)
Anesthesia Post Note  Patient: Stacey Monroe  Procedure(s) Performed: COLONOSCOPY WITH PROPOFOL (N/A ) ESOPHAGOGASTRODUODENOSCOPY (EGD) WITH PROPOFOL (N/A ) HOT HEMOSTASIS (ARGON PLASMA COAGULATION/BICAP) (N/A ) POLYPECTOMY     Patient location during evaluation: PACU Anesthesia Type: MAC Level of consciousness: awake and alert Pain management: pain level controlled Vital Signs Assessment: post-procedure vital signs reviewed and stable Respiratory status: spontaneous breathing, nonlabored ventilation and respiratory function stable Cardiovascular status: stable and blood pressure returned to baseline Anesthetic complications: no    Last Vitals:  Vitals:   07/08/19 0452 07/08/19 1121  BP: (!) 101/34 (!) 115/51  Pulse: 97 83  Resp: 20   Temp: 36.9 C   SpO2: 90% 99%    Last Pain:  Vitals:   07/08/19 0452  TempSrc: Oral  PainSc:                  Audry Pili

## 2019-07-08 NOTE — Progress Notes (Signed)
Stacey Monroe to be D/C'd per MD order. Discussed with the patient and all questions fully answered. ? VSS, Skin clean, dry and intact without evidence of skin break down, no evidence of skin tears noted. ? IV catheter discontinued intact. Site without signs and symptoms of complications. Dressing and pressure applied. ? An After Visit Summary was printed and given to the patient. Patient informed where to pickup prescriptions. ? D/c education completed with patient/family including follow up instructions, medication list, d/c activities limitations if indicated, with other d/c instructions as indicated by MD - patient able to verbalize understanding, all questions fully answered.  ? Patient instructed to return to ED, call 911, or call MD for any changes in condition.  ? Patient to be escorted via [place], and D/C home via private auto with significant other.

## 2019-07-08 NOTE — Discharge Instructions (Signed)
Call Dr. Benson Norway to make an appointment

## 2019-07-09 ENCOUNTER — Encounter: Payer: Self-pay | Admitting: Anesthesiology

## 2019-07-09 LAB — TYPE AND SCREEN
ABO/RH(D): B POS
Antibody Screen: NEGATIVE
Unit division: 0
Unit division: 0
Unit division: 0
Unit division: 0

## 2019-07-09 LAB — BPAM RBC
Blood Product Expiration Date: 202102032359
Blood Product Expiration Date: 202102042359
Blood Product Expiration Date: 202102082359
Blood Product Expiration Date: 202102082359
ISSUE DATE / TIME: 202101080953
ISSUE DATE / TIME: 202101082025
Unit Type and Rh: 7300
Unit Type and Rh: 7300
Unit Type and Rh: 7300
Unit Type and Rh: 7300

## 2019-07-09 NOTE — Transfer of Care (Signed)
Immediate Anesthesia Transfer of Care Note  Patient: Stacey Monroe  Procedure(s) Performed: COLONOSCOPY WITH PROPOFOL (N/A ) ESOPHAGOGASTRODUODENOSCOPY (EGD) WITH PROPOFOL (N/A ) HOT HEMOSTASIS (ARGON PLASMA COAGULATION/BICAP) (N/A ) POLYPECTOMY  Patient Location: PACU and Endoscopy Unit  Anesthesia Type:MAC  Level of Consciousness: awake, oriented and patient cooperative  Airway & Oxygen Therapy: Patient Spontanous Breathing and Patient connected to nasal cannula oxygen  Post-op Assessment: Report given to RN and Post -op Vital signs reviewed and stable  Post vital signs: Reviewed  Last Vitals:  Vitals Value Taken Time  BP 115/51 07/08/19 1121  Temp 36.9 C 07/08/19 0452  Pulse 83 07/08/19 1121  Resp 20 07/08/19 0452  SpO2 99 % 07/08/19 1121    Last Pain:  Vitals:   07/08/19 0452  TempSrc: Oral  PainSc:       Patients Stated Pain Goal: 2 (53/29/92 4268)  Complications: No apparent anesthesia complications

## 2019-07-09 NOTE — Discharge Summary (Signed)
Physician Discharge Summary  Patient ID: Stacey Monroe MRN: [id number] DOB/AGE: 60-Feb-1962 60 y.o.  Admit date: 07/05/2019 Discharge date: 07/09/2019  Admission Diagnoses: 1) Decompensated cirrhosis, 2) Hyponatremia   Discharge Diagnoses: 1) Cirrhosis, 2) Fluid overload, 3) Hyponatremia, 4) GAVE, 5) Colonic AVMs Active Problems:   Decompensated hepatic cirrhosis ([place])   Discharged Condition: stable  Hospital Course: The patient was admitted for worsening of her lower extremity edema along with her worsening renal insufficiency and hyponatremia.  Her admission labs showed that her creatinine and hyponatremia, but she was also noted to have a continued decline in her HGB.  Her baseline HGB was in the 14 g/dL range and it dropped to 9 g/dL in the office.  Her HGB was at 6.1 g/dL during the hospitalization and she was transfused 2 units of PRBC.  An EGD/colonoscopy was performed and it was significant for GAVE as well as AVMs in the proximal colon.  These lesions were successfully ablated with APC as well as removal of multiple polyps from the colon.  Her HGB at discharge was 7.2 g/dL.  Renal was consulted to assist in management of her fluid overload.  She was on Step II diuretics and on a <2 gram sodium diet, but continued to gain weight as an outpatient.  Renal provided with albumin and this improved her creatinine down to 0.9.  Her hyponatremia also improved, but her weight increased by several pounds.  There was no change with her lower extremity edema.  An echocardiogram was performed and it showed that her EF was at 60-65%.  An abdominal ultrasound was performed and it was negative for ascities, which questions the thought of decompensated cirrhosis.  On 07/07/2019 the patient wanted to be discharged home.  She was strongly encouraged to remain in the hospital, but she was complaining of back and neck pain from the hospital bed.  The patient will follow up in the office in 1 week.  She was advised  to resume her diuretics, which was spironolactone 100 mg and lasix.  IV lasix was administered, but she did not have a significant diuresis.  Consults: nephrology  Significant Diagnostic Studies: EGD/colonoscopy, echocardiogram, abdominal ultrasound  Treatments: Diuretics and IV Albumin  Discharge Exam: Blood pressure (!) 115/51, pulse 83, temperature 98.5 F (36.9 C), temperature source Oral, resp. rate 20, height 5\' 8"  (1.727 m), weight 122.8 kg, SpO2 99 %. General appearance: alert and no distress Resp: clear to auscultation bilaterally GI: soft, non-tender; bowel sounds normal; no masses,  no organomegaly Extremities: 4+ edema  Disposition: Discharge disposition: 01-Home or Self Care       Discharge Instructions    Diet - low sodium heart healthy   Complete by: As directed    Increase activity slowly   Complete by: As directed      Allergies as of 07/08/2019      Reactions   Penicillin G Hives   Did it involve swelling of the face/tongue/throat, SOB, or low BP?No Did it involve sudden or severe rash/hives, skin peeling, or any reaction on the inside of your mouth or nose? Yes Did you need to seek medical attention at a hospital or doctor's office? No When did it last happen?Child If all above answers are "NO", may proceed with cephalosporin use.      Medication List    TAKE these medications   furosemide 40 MG tablet Commonly known as: LASIX Take 1 tablet (40 mg total) by mouth 2 (two) times daily. Call Carol Ada,  MD for refills What changed:   when to take this  additional instructions   omeprazole 10 MG capsule Commonly known as: PRILOSEC Take 10 mg by mouth daily.   spironolactone 100 MG tablet Commonly known as: ALDACTONE Take 100 mg by mouth daily.   zolpidem 10 MG tablet Commonly known as: AMBIEN Take 10 mg by mouth at bedtime.      Follow-up Information    Carol Ada, MD Follow up.   Specialty: Gastroenterology Why: Call Dr.  Ulyses Amor office for a follow up appointment Contact information: [address] [place] [address] [phone]           Signed: Beryle Beams 07/09/2019, 8:34 AM

## 2019-07-10 LAB — SURGICAL PATHOLOGY

## 2019-07-31 ENCOUNTER — Encounter (HOSPITAL_COMMUNITY): Payer: Self-pay | Admitting: *Deleted

## 2019-07-31 ENCOUNTER — Observation Stay (HOSPITAL_BASED_OUTPATIENT_CLINIC_OR_DEPARTMENT_OTHER)
Admission: EM | Admit: 2019-07-31 | Discharge: 2019-08-01 | Disposition: A | Discharge status: Home / Self Care | Payer: Medicare Other | Source: Home / Self Care | Attending: Emergency Medicine | Admitting: Emergency Medicine

## 2019-07-31 ENCOUNTER — Emergency Department (HOSPITAL_COMMUNITY): Payer: Medicare Other

## 2019-07-31 DIAGNOSIS — E876 Hypokalemia: Secondary | ICD-10-CM

## 2019-07-31 DIAGNOSIS — Z880 Allergy status to penicillin: Secondary | ICD-10-CM | POA: Insufficient documentation

## 2019-07-31 DIAGNOSIS — E871 Hypo-osmolality and hyponatremia: Secondary | ICD-10-CM

## 2019-07-31 DIAGNOSIS — I5032 Chronic diastolic (congestive) heart failure: Secondary | ICD-10-CM | POA: Insufficient documentation

## 2019-07-31 DIAGNOSIS — R0602 Shortness of breath: Secondary | ICD-10-CM

## 2019-07-31 DIAGNOSIS — E785 Hyperlipidemia, unspecified: Secondary | ICD-10-CM | POA: Insufficient documentation

## 2019-07-31 DIAGNOSIS — D649 Anemia, unspecified: Secondary | ICD-10-CM | POA: Insufficient documentation

## 2019-07-31 DIAGNOSIS — F17210 Nicotine dependence, cigarettes, uncomplicated: Secondary | ICD-10-CM | POA: Insufficient documentation

## 2019-07-31 DIAGNOSIS — E119 Type 2 diabetes mellitus without complications: Secondary | ICD-10-CM | POA: Insufficient documentation

## 2019-07-31 DIAGNOSIS — Z79899 Other long term (current) drug therapy: Secondary | ICD-10-CM | POA: Insufficient documentation

## 2019-07-31 DIAGNOSIS — K7460 Unspecified cirrhosis of liver: Secondary | ICD-10-CM | POA: Insufficient documentation

## 2019-07-31 DIAGNOSIS — Z20822 Contact with and (suspected) exposure to covid-19: Secondary | ICD-10-CM | POA: Insufficient documentation

## 2019-07-31 DIAGNOSIS — K7290 Hepatic failure, unspecified without coma: Secondary | ICD-10-CM | POA: Diagnosis present

## 2019-07-31 DIAGNOSIS — I110 Hypertensive heart disease with heart failure: Secondary | ICD-10-CM | POA: Insufficient documentation

## 2019-07-31 LAB — CBC WITH DIFFERENTIAL/PLATELET
Abs Immature Granulocytes: 0.02 10*3/uL (ref 0.00–0.07)
Basophils Absolute: 0 10*3/uL (ref 0.0–0.1)
Basophils Relative: 1 %
Eosinophils Absolute: 0.1 10*3/uL (ref 0.0–0.5)
Eosinophils Relative: 3 %
HCT: 21.6 % — ABNORMAL LOW (ref 36.0–46.0)
Hemoglobin: 7 g/dL — ABNORMAL LOW (ref 12.0–15.0)
Immature Granulocytes: 1 %
Lymphocytes Relative: 19 %
Lymphs Abs: 0.8 10*3/uL (ref 0.7–4.0)
MCH: 36.8 pg — ABNORMAL HIGH (ref 26.0–34.0)
MCHC: 32.4 g/dL (ref 30.0–36.0)
MCV: 113.7 fL — ABNORMAL HIGH (ref 80.0–100.0)
Monocytes Absolute: 1 10*3/uL (ref 0.1–1.0)
Monocytes Relative: 23 %
Neutro Abs: 2.4 10*3/uL (ref 1.7–7.7)
Neutrophils Relative %: 53 %
Platelets: 138 10*3/uL — ABNORMAL LOW (ref 150–400)
RBC: 1.9 MIL/uL — ABNORMAL LOW (ref 3.87–5.11)
RDW: 21.2 % — ABNORMAL HIGH (ref 11.5–15.5)
WBC: 4.4 10*3/uL (ref 4.0–10.5)
nRBC: 0 % (ref 0.0–0.2)

## 2019-07-31 LAB — COMPREHENSIVE METABOLIC PANEL
ALT: 37 U/L (ref 0–44)
AST: 77 U/L — ABNORMAL HIGH (ref 15–41)
Albumin: 2.4 g/dL — ABNORMAL LOW (ref 3.5–5.0)
Alkaline Phosphatase: 121 U/L (ref 38–126)
Anion gap: 12 (ref 5–15)
BUN: 9 mg/dL (ref 6–20)
CO2: 27 mmol/L (ref 22–32)
Calcium: 8.7 mg/dL — ABNORMAL LOW (ref 8.9–10.3)
Chloride: 86 mmol/L — ABNORMAL LOW (ref 98–111)
Creatinine, Ser: 0.86 mg/dL (ref 0.44–1.00)
GFR calc Af Amer: >60 mL/min (ref >60)
GFR calc non Af Amer: >60 mL/min (ref >60)
Glucose, Bld: 112 mg/dL — ABNORMAL HIGH (ref 70–99)
Potassium: 2.7 mmol/L — CL (ref 3.5–5.1)
Sodium: 125 mmol/L — ABNORMAL LOW (ref 135–145)
Total Bilirubin: 3.6 mg/dL — ABNORMAL HIGH (ref 0.3–1.2)
Total Protein: 7.2 g/dL (ref 6.5–8.1)

## 2019-07-31 LAB — URINALYSIS, ROUTINE W REFLEX MICROSCOPIC
Bilirubin Urine: NEGATIVE
Glucose, UA: NEGATIVE mg/dL
Hgb urine dipstick: NEGATIVE
Ketones, ur: NEGATIVE mg/dL
Nitrite: POSITIVE — AB
Protein, ur: NEGATIVE mg/dL
Specific Gravity, Urine: 1.015 (ref 1.005–1.030)
WBC, UA: >50 WBC/hpf — ABNORMAL HIGH (ref 0–5)
pH: 5 (ref 5.0–8.0)

## 2019-07-31 LAB — MAGNESIUM: Magnesium: 1.8 mg/dL (ref 1.7–2.4)

## 2019-07-31 LAB — LIPASE, BLOOD: Lipase: 63 U/L — ABNORMAL HIGH (ref 11–51)

## 2019-07-31 LAB — PREPARE RBC (CROSSMATCH)

## 2019-07-31 LAB — AMMONIA: Ammonia: 47 umol/L — ABNORMAL HIGH (ref 9–35)

## 2019-07-31 LAB — BRAIN NATRIURETIC PEPTIDE: B Natriuretic Peptide: 276 pg/mL — ABNORMAL HIGH (ref 0.0–100.0)

## 2019-07-31 MED ORDER — ZOLPIDEM TARTRATE 5 MG PO TABS: 5.0000 mg | ORAL_TABLET | Freq: Every evening | ORAL | Status: DC | PRN

## 2019-07-31 MED ORDER — ACETAMINOPHEN 650 MG RE SUPP: 650.0000 mg | Freq: Four times a day (QID) | RECTAL | Status: DC | PRN

## 2019-07-31 MED ORDER — POTASSIUM CHLORIDE 10 MEQ/100ML IV SOLN
10.0000 meq | INTRAVENOUS | Status: AC
  Administered 2019-07-31 (×4): 10 meq via INTRAVENOUS
  Filled 2019-07-31 (×2): qty 100

## 2019-07-31 MED ORDER — BISACODYL 10 MG RE SUPP: 10.0000 mg | Freq: Every day | RECTAL | Status: DC | PRN

## 2019-07-31 MED ORDER — POTASSIUM CHLORIDE 20 MEQ PO PACK
40.0000 meq | PACK | ORAL | Status: AC
  Administered 2019-07-31 – 2019-08-01 (×4): 40 meq via ORAL
  Filled 2019-07-31 (×4): qty 2

## 2019-07-31 MED ORDER — SPIRONOLACTONE 25 MG PO TABS
100.0000 mg | ORAL_TABLET | Freq: Every day | ORAL | Status: DC
  Administered 2019-08-01: 100 mg via ORAL
  Filled 2019-07-31 (×3): qty 4

## 2019-07-31 MED ORDER — ONDANSETRON HCL 4 MG PO TABS: 4.0000 mg | ORAL_TABLET | Freq: Four times a day (QID) | ORAL | Status: DC | PRN

## 2019-07-31 MED ORDER — ENOXAPARIN SODIUM 40 MG/0.4ML ~~LOC~~ SOLN: 40.0000 mg | SUBCUTANEOUS | Status: DC

## 2019-07-31 MED ORDER — SODIUM CHLORIDE 0.9 % IV SOLN
1.0000 g | Freq: Once | INTRAVENOUS | Status: AC
  Administered 2019-07-31: 23:00:00 1 g via INTRAVENOUS
  Filled 2019-07-31: qty 10

## 2019-07-31 MED ORDER — ONDANSETRON HCL 4 MG/2ML IJ SOLN: 4.0000 mg | Freq: Four times a day (QID) | INTRAMUSCULAR | Status: DC | PRN

## 2019-07-31 MED ORDER — FUROSEMIDE 10 MG/ML IJ SOLN
40.0000 mg | Freq: Two times a day (BID) | INTRAMUSCULAR | Status: DC
  Administered 2019-07-31 – 2019-08-01 (×2): 40 mg via INTRAVENOUS
  Filled 2019-07-31 (×2): qty 4

## 2019-07-31 MED ORDER — SODIUM CHLORIDE 0.9% FLUSH
3.0000 mL | Freq: Two times a day (BID) | INTRAVENOUS | Status: DC
  Administered 2019-07-31 – 2019-08-01 (×2): 3 mL via INTRAVENOUS

## 2019-07-31 MED ORDER — PANTOPRAZOLE SODIUM 40 MG PO TBEC
40.0000 mg | DELAYED_RELEASE_TABLET | Freq: Every day | ORAL | Status: DC
  Administered 2019-07-31 – 2019-08-01 (×2): 40 mg via ORAL
  Filled 2019-07-31 (×2): qty 1

## 2019-07-31 MED ORDER — POLYETHYLENE GLYCOL 3350 17 G PO PACK: 17.0000 g | PACK | Freq: Every day | ORAL | Status: DC | PRN

## 2019-07-31 MED ORDER — ACETAMINOPHEN 325 MG PO TABS
650.0000 mg | ORAL_TABLET | Freq: Four times a day (QID) | ORAL | Status: DC | PRN
  Administered 2019-08-01: 650 mg via ORAL
  Filled 2019-07-31: qty 2

## 2019-07-31 MED ORDER — TRAMADOL HCL 50 MG PO TABS: 50.0000 mg | ORAL_TABLET | Freq: Three times a day (TID) | ORAL | Status: DC | PRN

## 2019-07-31 MED ORDER — SODIUM CHLORIDE 0.9 % IV SOLN: 250.0000 mL | INTRAVENOUS | Status: DC | PRN

## 2019-07-31 MED ORDER — SODIUM CHLORIDE 0.9% FLUSH: 3.0000 mL | INTRAVENOUS | Status: DC | PRN

## 2019-07-31 MED ORDER — SODIUM CHLORIDE 0.9% IV SOLUTION: Freq: Once | INTRAVENOUS | Status: AC

## 2019-07-31 NOTE — ED Notes (Signed)
Pt daughter updated, daughter asks to be updated.

## 2019-07-31 NOTE — H&P (Signed)
History and Physical    Patient Demographics:    Stacey Monroe CVE:[id number] DOB: [birth year] DOA: 07/31/2019  PCP: Aletha Halim., PA-C  Patient coming from: Home  I have personally briefly reviewed patient's old medical records in [place]  Chief Complaint: Shortness of breath, leg swelling    Assessment & Plan:     Assessment/Plan Principal Problem:   Hypokalemia Active Problems:   Decompensated hepatic cirrhosis (HCC)   Anemia   Hyponatremia     Principal Problem: Hypokalemia Potassium 2.7 on presentation.  Likely secondary to diuretic use. -We will monitor and supplement electrolytes.  Will check magnesium level.  Other Active Problems: Fluid overload  Appears to be multifactorial.  Likely related to underlying alcoholic liver cirrhosis with hypoalbuminemia.  Albumin level is 2.4.  Had recent admission at [facility] for GI bleed and fluid overload.  Was discharged on 07/08/2019.  Had work-up done including echocardiogram on 07/07/2019 which showed EF of 60 to 65% with overall normal systolic function and no valvular heart disease.   -We will place on IV Lasix -Monitor input output, renal function  Chronic hyponatremia: Sodium 125 on presentation.  Appears to be at baseline. Likely hypervolemia secondary to underlying cirrhosis. -We will repeat hyponatremia work-up -Continue IV Lasix  Chronic alcoholic liver cirrhosis As prior history of significant alcohol use.  Does not have any evidence of portal hypertension, esophageal varices.  Only mild thrombocytopenia.  No coagulopathy.  No evidence of ascites. -Follow GI as outpatient.  Follows with Dr. Benson Norway. -Patient was requesting transfer to [facility].  Attempted to contact Dr. Benson Norway to facilitate transfer however could not reach him.  Have admitted patient overnight for observation.  Consider transfer to [facility] if indicated.  Chronic symptomatic macrocytic anemia Hemoglobin 7.0 on presentation.  MCV noted to be elevated.   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  These lesions were successfully ablated with APC as well as removal of multiple polyps from the colon. -Patient appears to have worsening shortness of breath likely combination of fluid overload as well as symptomatic anemia.  Hemoglobin is 7.0 which is slightly lower than 7.2 on prior discharge.  Will give 1 unit of packed RBCs.  Abnormal urinalysis, concerning for UTI: Urinalysis shows more than 150 WBCs, positive nitrites, leukocytes.  No symptoms of dysuria however does seem concerning for possible infection. -We will give 1 dose of IV ceftriaxone and follow urine culture.  DVT prophylaxis: SCDs Code Status:  Full code Family Communication: N/A  Disposition Plan: Placed in observation for electrolyte supplementation, diuresis Consults called: N/A Admission status: Observation stay     HPI:     HPI: Stacey Monroe is a 59 y.o. female with medical history significant of chronic alcoholic liver cirrhosis, chronic macrocytic anemia, hyponatremia who presented to the ER with worsening shortness of breath, bilateral lower extremity swelling.  Patient had recent admission at [facility] and was seen by nephrology and GI service for fluid overload, anemia.  Patient had been placed on Lasix, spironolactone and had been given albumin during hospital stay.  Also had an EGD and colonoscopy for worsening anemia and had ablation of AVMs in proximal colon.  Patient states she has been feeling worsening shortness of breath with exertion over the last few days since discharge.  She has had orthopnea.  Has persistent and worsening bilateral lower extremity swelling. ED Course:  Vital Signs reviewed  on presentation, significant for patient is afebrile, heart rate 95, blood pressure 115/49, saturation 94% on room air. Labs reviewed, significant for sodium 125, potassium 2.7, chloride 86,  BUN 9, creatinine 0.8, albumin 2.4, lipase 63, AST 77, ammonia 47, total bilirubin 3.6, BNP 276, WBC count 4.4, hemoglobin 7.0, hematocrit 21, platelets 138, MCV 113, urinalysis shows moderate leukocytes, positive nitrites, more than 50 WBCs. Imaging personally Reviewed, chest x-ray shows mild to moderate cardiomegaly, degenerative changes in thoracic spine, mild right basilar linear atelectasis. EKG personally reviewed, shows sinus rhythm, no acute ST-T changes    Review of systems:    Review of Systems: As per HPI otherwise 10 point review of systems negative.  All other review of systems is negative except the ones noted above in the HPI.    Past Medical and Surgical History:  Reviewed by me  Past Medical History:  Diagnosis Date  . Chronic diastolic heart failure ([place])   . Chronic musculoskeletal pain   . Diabetes mellitus without complication ([place])   . Hyperlipidemia   . Hypertension   . Low back pain   . OSA on CPAP   . Osteoarthritis    knees  . Pre-eclampsia   . Sleep apnea, obstructive    patient denies  . Slipped cervical disc   . SOB (shortness of breath)     Past Surgical History:  Procedure Laterality Date  . CESAREAN SECTION    . COLONOSCOPY WITH PROPOFOL N/A 07/06/2019   Procedure: COLONOSCOPY WITH PROPOFOL;  Surgeon: Carol Ada, MD;  Location: [place];  Service: Endoscopy;  Laterality: N/A;  . ESOPHAGOGASTRODUODENOSCOPY (EGD) WITH PROPOFOL N/A 07/06/2019   Procedure: ESOPHAGOGASTRODUODENOSCOPY (EGD) WITH PROPOFOL;  Surgeon: Carol Ada, MD;  Location: [place];  Service: Endoscopy;  Laterality: N/A;  . HOT HEMOSTASIS N/A 07/06/2019   Procedure: HOT HEMOSTASIS (ARGON PLASMA COAGULATION/BICAP);  Surgeon: Carol Ada, MD;  Location: [place];  Service: Endoscopy;  Laterality: N/A;  . POLYPECTOMY  07/06/2019   Procedure: POLYPECTOMY;  Surgeon: Carol Ada, MD;  Location: [facility] ENDOSCOPY;  Service: Endoscopy;;  . TUBAL LIGATION       Social History:   Reviewed by me   reports that she has been smoking cigarettes. She has a 30.00 pack-year smoking history. She has never used smokeless tobacco. She reports that she does not drink alcohol or use drugs.  Allergies:    Allergies  Allergen Reactions  . Penicillin G Hives    Did it involve swelling of the face/tongue/throat, SOB, or low BP?No Did it involve sudden or severe rash/hives, skin peeling, or any reaction on the inside of your mouth or nose? Yes Did you need to seek medical attention at a hospital or doctor's office? No When did it last happen?Child If all above answers are "NO", may proceed with cephalosporin use.    Family History :   No family history on file. Family history reviewed, noted as above, not pertinent to current presentation.   Home Medications:    Prior to Admission medications   Medication Sig Start Date End Date Taking? Authorizing Provider  acetaminophen (TYLENOL) 500 MG tablet Take 500 mg by mouth every 6 (six) hours as needed for mild pain, moderate pain or headache.   Yes [provider]  omeprazole (PRILOSEC) 10 MG capsule Take 10 mg by mouth daily.   Yes [provider]  spironolactone (ALDACTONE) 100 MG tablet Take 100 mg by mouth daily. 04/05/16  Yes [provider]  zolpidem (AMBIEN) 10 MG  tablet Take 10 mg by mouth at bedtime. 03/24/16  Yes [provider]  furosemide (LASIX) 40 MG tablet Take 1 tablet (40 mg total) by mouth 2 (two) times daily. Call Carol Ada, MD for refills Patient not taking: Reported on 07/31/2019 07/08/19   Willia Craze, NP    Physical Exam:    Physical Exam: Vitals:   07/31/19 1730 07/31/19 1800 07/31/19 1930 07/31/19 2000  BP: (!) 102/36 (!) 106/48 (!) 89/78 (!) 115/49  Pulse: 90 86 89 89  Resp: 19 19 19 20   SpO2: 97% 96% 94% 94%  Weight:      Height:        Constitutional: NAD, calm, comfortable Vitals:   07/31/19 1730 07/31/19 1800 07/31/19 1930 07/31/19 2000    BP: (!) 102/36 (!) 106/48 (!) 89/78 (!) 115/49  Pulse: 90 86 89 89  Resp: 19 19 19 20   SpO2: 97% 96% 94% 94%  Weight:      Height:       Eyes: PERRL, lids and conjunctivae normal ENMT: Mucous membranes are moist. Posterior pharynx clear of any exudate or lesions.Normal dentition.  Neck: normal, supple, no masses, no thyromegaly Respiratory: clear to auscultation bilaterally, no wheezing, no crackles. Normal respiratory effort. No accessory muscle use.  Cardiovascular: Regular rate and rhythm, no murmurs / rubs / gallops.  Bilateral 3+ edema.. 2+ pedal pulses. No carotid bruits.  Abdomen: no tenderness, no masses palpated. No hepatosplenomegaly. Bowel sounds positive.  Musculoskeletal: no clubbing / cyanosis. No joint deformity upper and lower extremities. Good ROM, no contractures. Normal muscle tone.  Skin: no rashes, lesions, ulcers. No induration Neurologic: CN 2-12 grossly intact. Sensation intact, DTR normal. Strength 5/5 in all 4.  Psychiatric: Normal judgment and insight. Alert and oriented x 3. Normal mood.    Decubitus Ulcers: Not present on admission Catheters and tubes: None  Data Review:    Labs on Admission: I have personally reviewed following labs and imaging studies  CBC: Recent Labs  Lab 07/31/19 1500  WBC 4.4  NEUTROABS 2.4  HGB 7.0*  HCT 21.6*  MCV 113.7*  PLT 628*   Basic Metabolic Panel: Recent Labs  Lab 07/31/19 1500  NA 125*  K 2.7*  CL 86*  CO2 27  GLUCOSE 112*  BUN 9  CREATININE 0.86  CALCIUM 8.7*   GFR: Estimated Creatinine Clearance: 93.5 mL/min (by C-G formula based on SCr of 0.86 mg/dL). Liver Function Tests: Recent Labs  Lab 07/31/19 1500  AST 77*  ALT 37  ALKPHOS 121  BILITOT 3.6*  PROT 7.2  ALBUMIN 2.4*   Recent Labs  Lab 07/31/19 1500  LIPASE 63*   Recent Labs  Lab 07/31/19 1551  AMMONIA 47*   Coagulation Profile: No results for input(s): INR, PROTIME in the last 168 hours. Cardiac Enzymes: No results for  input(s): CKTOTAL, CKMB, CKMBINDEX, TROPONINI in the last 168 hours. BNP (last 3 results) No results for input(s): PROBNP in the last 8760 hours. HbA1C: No results for input(s): HGBA1C in the last 72 hours. CBG: No results for input(s): GLUCAP in the last 168 hours. Lipid Profile: No results for input(s): CHOL, HDL, LDLCALC, TRIG, CHOLHDL, LDLDIRECT in the last 72 hours. Thyroid Function Tests: No results for input(s): TSH, T4TOTAL, FREET4, T3FREE, THYROIDAB in the last 72 hours. Anemia Panel: No results for input(s): VITAMINB12, FOLATE, FERRITIN, TIBC, IRON, RETICCTPCT in the last 72 hours. Urine analysis:    Component Value Date/Time   COLORURINE YELLOW 07/31/2019 1436   APPEARANCEUR  CLEAR 07/31/2019 1436   LABSPEC 1.015 07/31/2019 1436   PHURINE 5.0 07/31/2019 1436   GLUCOSEU NEGATIVE 07/31/2019 1436   HGBUR NEGATIVE 07/31/2019 1436   BILIRUBINUR NEGATIVE 07/31/2019 1436   KETONESUR NEGATIVE 07/31/2019 1436   PROTEINUR NEGATIVE 07/31/2019 1436   UROBILINOGEN 2.0 (H) 10/29/2009 2340   NITRITE POSITIVE (A) 07/31/2019 1436   LEUKOCYTESUR MODERATE (A) 07/31/2019 1436     Imaging Results:      Radiological Exams on Admission: DG Chest Port 1 View  Result Date: 07/31/2019 CLINICAL DATA:  Shortness of breath and lower extremity swelling. EXAM: PORTABLE CHEST 1 VIEW COMPARISON:  None. FINDINGS: Mild linear atelectasis is seen within the right lung base. There is no evidence of a pleural effusion or pneumothorax. There is mild to moderate severity enlargement of the cardiac silhouette. Mild calcification of the aortic arch is seen. Degenerative changes seen throughout the thoracic spine. IMPRESSION: 1. Mild right basilar linear atelectasis. 2. Cardiomegaly. Electronically Signed   By: Virgina Norfolk M.D.   On: 07/31/2019 15:16      Clerence Gubser Ginette Otto MD [facility]  If 7PM-7AM, please contact night-coverage   07/31/2019, 8:57 PM

## 2019-07-31 NOTE — ED Triage Notes (Signed)
Pt in from home via [facility], pt here c/o SOB that started x 10 days ago, with recent hospitalization for eval of bil leg edema, recent blood transfusion during hospital stay per EMS, pt tachypnic pta, sats 97% on RA, pt NSR in route, pt denies n/v/d, pt A&O x4

## 2019-07-31 NOTE — ED Provider Notes (Signed)
[facility] Provider Note   CSN: [id number] Arrival date & time: 07/31/19  1341     History Chief Complaint  Patient presents with  . Shortness of Breath    Stacey Monroe is a 59 y.o. female.  Pt complains of bilat lower leg swelling and shortness of breath.  Pt reports she has liver failure.  Pt states Dr. Benson Norway told her to go to [facility] but EMS brought her here.  Pt complains of increased shortness of breath.  Pt reports both legs are swollen.  Pt reports she can not walk because of swelling and shortness of breath.  Pt was hospitalized 3 weeks ago with similar.  Pt reports she had to have a blood transfusion. She states that she was not told why.   The history is provided by the patient. No language interpreter was used.  Shortness of Breath Severity:  Moderate Onset quality:  Gradual Timing:  Constant Progression:  Worsening Chronicity:  New Relieved by:  Diuretics Ineffective treatments:  Diuretics Associated symptoms: no abdominal pain   Risk factors: no recent alcohol use        Past Medical History:  Diagnosis Date  . Chronic diastolic heart failure ([place])   . Chronic musculoskeletal pain   . Diabetes mellitus without complication ([place])   . Hyperlipidemia   . Hypertension   . Low back pain   . OSA on CPAP   . Osteoarthritis    knees  . Pre-eclampsia   . Sleep apnea, obstructive    patient denies  . Slipped cervical disc   . SOB (shortness of breath)     Patient Active Problem List   Diagnosis Date Noted  . Decompensated hepatic cirrhosis ([place]) 07/05/2019  . Cirrhosis ([place]) 04/06/2016  . Tobacco abuse 04/06/2016  . Stress 04/06/2016    Past Surgical History:  Procedure Laterality Date  . CESAREAN SECTION    . COLONOSCOPY WITH PROPOFOL N/A 07/06/2019   Procedure: COLONOSCOPY WITH PROPOFOL;  Surgeon: Carol Ada, MD;  Location: [place];  Service: Endoscopy;  Laterality: N/A;  . ESOPHAGOGASTRODUODENOSCOPY (EGD) WITH  PROPOFOL N/A 07/06/2019   Procedure: ESOPHAGOGASTRODUODENOSCOPY (EGD) WITH PROPOFOL;  Surgeon: Carol Ada, MD;  Location: [place];  Service: Endoscopy;  Laterality: N/A;  . HOT HEMOSTASIS N/A 07/06/2019   Procedure: HOT HEMOSTASIS (ARGON PLASMA COAGULATION/BICAP);  Surgeon: Carol Ada, MD;  Location: [place];  Service: Endoscopy;  Laterality: N/A;  . POLYPECTOMY  07/06/2019   Procedure: POLYPECTOMY;  Surgeon: Carol Ada, MD;  Location: [facility] ENDOSCOPY;  Service: Endoscopy;;  . TUBAL LIGATION       OB History   No obstetric history on file.     No family history on file.  Social History   Tobacco Use  . Smoking status: Current Every Day Smoker    Packs/day: 1.00    Years: 30.00    Pack years: 30.00    Types: Cigarettes  . Smokeless tobacco: Never Used  Substance Use Topics  . Alcohol use: No  . Drug use: No    Home Medications Prior to Admission medications   Medication Sig Start Date End Date Taking? Authorizing Provider  furosemide (LASIX) 40 MG tablet Take 1 tablet (40 mg total) by mouth 2 (two) times daily. Call Carol Ada, MD for refills 07/08/19   Willia Craze, NP  omeprazole (PRILOSEC) 10 MG capsule Take 10 mg by mouth daily.    [provider]  spironolactone (ALDACTONE) 100 MG tablet Take 100 mg  by mouth daily. 04/05/16   [provider]  zolpidem (AMBIEN) 10 MG tablet Take 10 mg by mouth at bedtime. 03/24/16   [provider]    Allergies    Penicillin g  Review of Systems   Review of Systems  Respiratory: Positive for shortness of breath.   Gastrointestinal: Negative for abdominal pain.  All other systems reviewed and are negative.   Physical Exam Updated Vital Signs Ht 5\' 7"  (1.702 m)   Wt 117.9 kg   SpO2 97%   BMI 40.72 kg/m   Physical Exam Vitals and nursing note reviewed.  Constitutional:      Appearance: She is well-developed.  HENT:     Head: Normocephalic.  Cardiovascular:     Rate and Rhythm:  Normal rate.  Pulmonary:     Effort: Pulmonary effort is normal.     Breath sounds: No decreased breath sounds.  Chest:     Chest wall: No mass.  Abdominal:     General: There is no distension.     Palpations: Abdomen is soft. There is no mass.  Musculoskeletal:        General: Normal range of motion.     Cervical back: Normal range of motion.  Skin:    General: Skin is warm.  Neurological:     Mental Status: She is alert and oriented to person, place, and time.     ED Results / Procedures / Treatments   Labs (all labs ordered are listed, but only abnormal results are displayed) Labs Reviewed  CBC WITH DIFFERENTIAL/PLATELET  COMPREHENSIVE METABOLIC PANEL  LIPASE, BLOOD  AMMONIA  URINALYSIS, ROUTINE W REFLEX MICROSCOPIC  BRAIN NATRIURETIC PEPTIDE  TYPE AND SCREEN    EKG None  Radiology No results found.  Procedures Procedures (including critical care time)  Medications Ordered in ED Medications - No data to display  ED Course  I have reviewed the triage vital signs and the nursing notes.  Pertinent labs & imaging results that were available during my care of the patient were reviewed by me and considered in my medical decision making (see chart for details).    MDM Rules/Calculators/A&P                      Pt's care turned over to Kem Parkinson PA labs pending  Final Clinical Impression(s) / ED Diagnoses Final diagnoses:  SOB (shortness of breath)    Rx / DC Orders ED Discharge Orders    None       Sidney Ace 07/31/19 1554    Milton Ferguson, MD 08/01/19 [phone]

## 2019-07-31 NOTE — ED Provider Notes (Addendum)
Patient signed out to me by Marcene Brawn, PA-C left at end of shift pending completion of work-up.  [name] is a 59 year old female with past medical history of diabetes, hypertension, chronic diastolic heart failure and cirrhosis.  She was admitted to [facility] on 07/05/2019 for decompensated cirrhosis and further evaluation of her worsening lower extremity edema.  She was also found to have a decline in her hemoglobin.  During her hospitalization, her hemoglobin dropped to 6.1 she was transfused 2 units of packed red blood cells.  She had an EEG EGD and colonoscopy performed by Dr. Benson Norway.  Today, she comes to the emergency department via EMS for evaluation of shortness of breath and bilateral lower leg edema.  Of note, patient is upset that [facility] transported her to [facility].  She was requesting to go to [facility] to see her gastroenterologist, Dr. Benson Norway.  Labs today show that patient is hyponatremic as well as hypokalemic.  The hyponatremia seems to be at her baseline.  IV Fluids have been limited given her CHF.  Her BNP is also mildly elevated, she is currently taking diuretics.  She will likely need admission and further evaluation.  Hemoglobin is 7 today, it was 7.2 three weeks ago.     Labs Reviewed  CBC WITH DIFFERENTIAL/PLATELET - Abnormal; Notable for the following components:      Result Value   RBC 1.90 (*)    Hemoglobin 7.0 (*)    HCT 21.6 (*)    MCV 113.7 (*)    MCH 36.8 (*)    RDW 21.2 (*)    Platelets 138 (*)    All other components within normal limits  COMPREHENSIVE METABOLIC PANEL - Abnormal; Notable for the following components:   Sodium 125 (*)    Potassium 2.7 (*)    Chloride 86 (*)    Glucose, Bld 112 (*)    Calcium 8.7 (*)    Albumin 2.4 (*)    AST 77 (*)    Total Bilirubin 3.6 (*)    All other components within normal limits  LIPASE, BLOOD - Abnormal; Notable for the following components:   Lipase 63 (*)    All other components within normal  limits  AMMONIA - Abnormal; Notable for the following components:   Ammonia 47 (*)    All other components within normal limits  URINALYSIS, ROUTINE W REFLEX MICROSCOPIC - Abnormal; Notable for the following components:   Nitrite POSITIVE (*)    Leukocytes,Ua MODERATE (*)    WBC, UA >50 (*)    Bacteria, UA FEW (*)    All other components within normal limits  BRAIN NATRIURETIC PEPTIDE - Abnormal; Notable for the following components:   B Natriuretic Peptide 276.0 (*)    All other components within normal limits  SARS CORONAVIRUS 2 (TAT 6-24 HRS)  TYPE AND SCREEN    CRITICAL CARE Performed by: Kiora Hallberg Total critical care time: 35  minutes Critical care time was exclusive of separately billable procedures and treating other patients. Critical care was necessary to treat or prevent imminent or life-threatening deterioration. Critical care was time spent personally by me on the following activities: development of treatment plan with patient and/or surrogate as well as nursing, discussions with consultants, evaluation of patient's response to treatment, examination of patient, obtaining history from patient or surrogate, ordering and performing treatments and interventions, ordering and review of laboratory studies, ordering and review of radiographic studies, pulse oximetry and re-evaluation of patient's condition.  [place] hospitalist, Dr. Scherrie November, and discussed findings.  I will also consult pt's GI, Dr. Benson Norway to see if he prefers that pt be sent to [facility]   Multiple unsuccessful attempts were made to contact Dr. Benson Norway  I have spoken with the patient and she is agreeable to stay her overnight.  I have spoken with hospitalist again and he agrees to admit.       Kem Parkinson, PA-C 07/31/19 2043    Kem Parkinson, PA-C 07/31/19 2044    Dorie Rank, MD 08/01/19 1055

## 2019-08-01 ENCOUNTER — Other Ambulatory Visit: Payer: Self-pay

## 2019-08-01 DIAGNOSIS — E876 Hypokalemia: Secondary | ICD-10-CM | POA: Diagnosis not present

## 2019-08-01 LAB — COMPREHENSIVE METABOLIC PANEL WITH GFR
ALT: 33 U/L (ref 0–44)
AST: 71 U/L — ABNORMAL HIGH (ref 15–41)
Albumin: 2.4 g/dL — ABNORMAL LOW (ref 3.5–5.0)
Alkaline Phosphatase: 111 U/L (ref 38–126)
Anion gap: 11 (ref 5–15)
BUN: 10 mg/dL (ref 6–20)
CO2: 27 mmol/L (ref 22–32)
Calcium: 8.5 mg/dL — ABNORMAL LOW (ref 8.9–10.3)
Chloride: 88 mmol/L — ABNORMAL LOW (ref 98–111)
Creatinine, Ser: 0.99 mg/dL (ref 0.44–1.00)
GFR calc Af Amer: >60 mL/min
GFR calc non Af Amer: >60 mL/min
Glucose, Bld: 114 mg/dL — ABNORMAL HIGH (ref 70–99)
Potassium: 4.2 mmol/L (ref 3.5–5.1)
Sodium: 126 mmol/L — ABNORMAL LOW (ref 135–145)
Total Bilirubin: 4.2 mg/dL — ABNORMAL HIGH (ref 0.3–1.2)
Total Protein: 6.7 g/dL (ref 6.5–8.1)

## 2019-08-01 LAB — VITAMIN B12: Vitamin B-12: 1795 pg/mL — ABNORMAL HIGH (ref 180–914)

## 2019-08-01 LAB — CBC
HCT: 21.6 % — ABNORMAL LOW (ref 36.0–46.0)
Hemoglobin: 7.1 g/dL — ABNORMAL LOW (ref 12.0–15.0)
MCH: 35.5 pg — ABNORMAL HIGH (ref 26.0–34.0)
MCHC: 32.9 g/dL (ref 30.0–36.0)
MCV: 108 fL — ABNORMAL HIGH (ref 80.0–100.0)
Platelets: 122 K/uL — ABNORMAL LOW (ref 150–400)
RBC: 2 MIL/uL — ABNORMAL LOW (ref 3.87–5.11)
RDW: 23 % — ABNORMAL HIGH (ref 11.5–15.5)
WBC: 3.9 K/uL — ABNORMAL LOW (ref 4.0–10.5)
nRBC: 0 % (ref 0.0–0.2)

## 2019-08-01 LAB — OSMOLALITY: Osmolality: 287 mosm/kg (ref 275–295)

## 2019-08-01 LAB — OSMOLALITY, URINE: Osmolality, Ur: 380 mosm/kg (ref 300–900)

## 2019-08-01 LAB — CREATININE, URINE, RANDOM: Creatinine, Urine: 176.7 mg/dL

## 2019-08-01 LAB — SARS CORONAVIRUS 2 (TAT 6-24 HRS): SARS Coronavirus 2: NEGATIVE

## 2019-08-01 LAB — FOLATE: Folate: 3.3 ng/mL — ABNORMAL LOW (ref >5.9)

## 2019-08-01 LAB — HIV ANTIBODY (ROUTINE TESTING W REFLEX): HIV Screen 4th Generation wRfx: NONREACTIVE

## 2019-08-01 LAB — SODIUM, URINE, RANDOM: Sodium, Ur: 9 mmol/L

## 2019-08-01 NOTE — Progress Notes (Signed)
Nsg Discharge Note  Admit Date:  07/31/2019 Discharge date: 08/01/2019   Stacey Monroe to be D/C'd Home per MD order.  AVS completed.  Copy for chart, and copy for patient signed, and dated. Removed IV-clean, dry, intact. Tiffany reviewed d/c paperwork with patient. Answered all questions. Both Tiffany and I talked via the phone with daughter-in-law regarding patient's discharge. I advised her to take her to [facility] if she felt it necessary. Patient/caregiver able to verbalize understanding.  Discharge Medication: Allergies as of 08/01/2019      Reactions   Penicillin G Hives   Did it involve swelling of the face/tongue/throat, SOB, or low BP?No Did it involve sudden or severe rash/hives, skin peeling, or any reaction on the inside of your mouth or nose? Yes Did you need to seek medical attention at a hospital or doctor's office? No When did it last happen?Child If all above answers are "NO", may proceed with cephalosporin use.      Medication List    TAKE these medications   acetaminophen 500 MG tablet Commonly known as: TYLENOL Take 500 mg by mouth every 6 (six) hours as needed for mild pain, moderate pain or headache.   furosemide 40 MG tablet Commonly known as: LASIX Take 1 tablet (40 mg total) by mouth 2 (two) times daily. Call Carol Ada, MD for refills   omeprazole 10 MG capsule Commonly known as: PRILOSEC Take 10 mg by mouth daily.   spironolactone 100 MG tablet Commonly known as: ALDACTONE Take 100 mg by mouth daily.   zolpidem 10 MG tablet Commonly known as: AMBIEN Take 10 mg by mouth at bedtime.       Discharge Assessment: Vitals:   08/01/19 0448 08/01/19 0840  BP: (!) 97/41   Pulse: 86   Resp: 20   Temp: 98.8 F (37.1 C)   SpO2: 95% 96%   Skin clean, dry and intact without evidence of skin break down, no evidence of skin tears noted. IV catheter discontinued intact. Site without signs and symptoms of complications - no redness or edema noted  at insertion site, patient denies c/o pain - only slight tenderness at site.  Dressing with slight pressure applied.  D/c Instructions-Education: Discharge instructions given to patient/family with verbalized understanding. D/c education completed with patient/family including follow up instructions, medication list, d/c activities limitations if indicated, with other d/c instructions as indicated by MD - patient able to verbalize understanding, all questions fully answered. Patient instructed to return to ED, call 911, or call MD for any changes in condition.  Patient escorted via [place], and D/C home via private auto.  Santa Lighter, RN 08/01/2019 11:52 AM

## 2019-08-01 NOTE — Plan of Care (Signed)
  Problem: Education: Goal: Knowledge of General Education information will improve Description: Including pain rating scale, medication(s)/side effects and non-pharmacologic comfort measures 08/01/2019 1057 by Santa Lighter, RN Outcome: Adequate for Discharge 08/01/2019 1056 by Santa Lighter, RN Outcome: Progressing   Problem: Clinical Measurements: Goal: Ability to maintain clinical measurements within normal limits will improve Outcome: Adequate for Discharge Goal: Will remain free from infection 08/01/2019 1057 by Santa Lighter, RN Outcome: Adequate for Discharge 08/01/2019 1056 by Santa Lighter, RN Outcome: Progressing Goal: Diagnostic test results will improve Outcome: Adequate for Discharge Goal: Respiratory complications will improve Outcome: Adequate for Discharge Goal: Cardiovascular complication will be avoided Outcome: Adequate for Discharge   Problem: Activity: Goal: Risk for activity intolerance will decrease Outcome: Adequate for Discharge   Problem: Nutrition: Goal: Adequate nutrition will be maintained Outcome: Adequate for Discharge   Problem: Coping: Goal: Level of anxiety will decrease Outcome: Adequate for Discharge   Problem: Elimination: Goal: Will not experience complications related to bowel motility Outcome: Adequate for Discharge Goal: Will not experience complications related to urinary retention Outcome: Adequate for Discharge   Problem: Pain Managment: Goal: General experience of comfort will improve Outcome: Adequate for Discharge   Problem: Safety: Goal: Ability to remain free from injury will improve Outcome: Adequate for Discharge   Problem: Skin Integrity: Goal: Risk for impaired skin integrity will decrease Outcome: Adequate for Discharge

## 2019-08-01 NOTE — Plan of Care (Signed)
  Problem: Education: Goal: Knowledge of General Education information will improve Description: Including pain rating scale, medication(s)/side effects and non-pharmacologic comfort measures Outcome: Progressing   Problem: Health Behavior/Discharge Planning: Goal: Ability to manage health-related needs will improve Outcome: Progressing   Problem: Clinical Measurements: Goal: Will remain free from infection Outcome: Progressing   

## 2019-08-01 NOTE — Progress Notes (Addendum)
UNMATCHED BLOOD PRODUCT NOTE  Compare the patient ID on the blood tag to the patient ID on the hospital armband and Blood Bank armband. Then confirm the unit number on the blood tag matches the unit number on the blood product.  If a discrepancy is discovered return the product to blood bank immediately.   Blood Product Type: Packed Red Blood Cells  Unit #: (Found on blood product bag, begins with W) [id number]  Product Code #: (Found on blood product bag, begins with E) [id number]   Start Time: 0145  Starting Rate: 120 ml/hr  Rate increase/decreased  (if applicable):      ml/hr  Rate changed time (if applicable):    Stop Time: 0448   All Other Documentation should be documented within the Blood Admin Flowsheet per policy.

## 2019-08-01 NOTE — Discharge Summary (Signed)
Physician Discharge Summary  Stacey Monroe WRU:[id number] DOB: Jan 27, 1961 DOA: 07/31/2019  PCP: Aletha Halim., PA-C  Admit date: 07/31/2019 Discharge date: 08/01/2019  Admitted From: Home Disposition: Home  Recommendations for Outpatient Follow-up:  1. Follow up with PCP in 1-2 weeks 2. Please follow up on the following pending results:  Home Health: None Equipment/Devices: None  Discharge Condition: Stable CODE STATUS: Full code Diet recommendation: Regular  Brief/Interim Summary: This is a 59 year old lady who was admitted yesterday with symptoms of dyspnea and was found to be hypokalemic.  She was also symptomatic with anemia with a hemoglobin of 7.0.  She was given 1 unit of blood transfusion and also started on antibiotics for possible UTI, although she really does not have any symptoms of UTI whatsoever. This morning she is much improved with her dyspnea and wants to go home. The chronic edema that she has is unchanged largely.  She has been on IV Lasix in the hospital but has oral Lasix at home.  She will follow with this. I will send her home on with her home medications. Discharge Diagnoses:  Principal Problem:   Hypokalemia Active Problems:   Decompensated hepatic cirrhosis (HCC)   Anemia   Hyponatremia    Discharge Instructions  Discharge Instructions    Diet - low sodium heart healthy   Complete by: As directed    Increase activity slowly   Complete by: As directed      Allergies as of 08/01/2019      Reactions   Penicillin G Hives   Did it involve swelling of the face/tongue/throat, SOB, or low BP?No Did it involve sudden or severe rash/hives, skin peeling, or any reaction on the inside of your mouth or nose? Yes Did you need to seek medical attention at a hospital or doctor's office? No When did it last happen?Child If all above answers are "NO", may proceed with cephalosporin use.      Medication List    TAKE these medications    acetaminophen 500 MG tablet Commonly known as: TYLENOL Take 500 mg by mouth every 6 (six) hours as needed for mild pain, moderate pain or headache.   furosemide 40 MG tablet Commonly known as: LASIX Take 1 tablet (40 mg total) by mouth 2 (two) times daily. Call Carol Ada, MD for refills   omeprazole 10 MG capsule Commonly known as: PRILOSEC Take 10 mg by mouth daily.   spironolactone 100 MG tablet Commonly known as: ALDACTONE Take 100 mg by mouth daily.   zolpidem 10 MG tablet Commonly known as: AMBIEN Take 10 mg by mouth at bedtime.         Consultations:  None   Procedures/Studies: US Abdomen Complete  Result Date: 07/06/2019 CLINICAL DATA:  Cirrhosis. EXAM: ABDOMEN ULTRASOUND COMPLETE COMPARISON:  Ultrasound 01/03/2012. FINDINGS: Gallbladder: Exam is extremely limited due to patient's body habitus and bowel gas. Gallbladder not visualized. Common bile duct: Diameter: Not visualized. Liver: Liver is poorly visualized. Liver is echogenic with a nodular contour consistent patient's cirrhosis noted. Evaluation for mass lesion difficult due to poor visualization of the liver. Reversal portal venous flow noted. IVC: No abnormality visualized. Pancreas: Visualized portion unremarkable. Spleen: Size and appearance within normal limits. Right Kidney: Length: 10.8 cm. Echogenicity within normal limits. No mass or hydronephrosis visualized. Left Kidney: Length: 10.9 cm. Echogenicity within normal limits. No mass or hydronephrosis visualized. Abdominal aorta: No aneurysm visualized. Other findings: None. IMPRESSION: 1. Extremely limited exam due to patient's body habitus and bowel  gas. Gallbladder and common bile duct not visualized. 2. Liver is poorly visualized. The liver is echogenic with a nodular contour consistent with the patient's known cirrhosis. Reversal of portal venous flow noted. Electronically Signed   By: Marcello Moores  Register   On: 07/06/2019 07:33   DG Chest Port 1  View  Result Date: 07/31/2019 CLINICAL DATA:  Shortness of breath and lower extremity swelling. EXAM: PORTABLE CHEST 1 VIEW COMPARISON:  None. FINDINGS: Mild linear atelectasis is seen within the right lung base. There is no evidence of a pleural effusion or pneumothorax. There is mild to moderate severity enlargement of the cardiac silhouette. Mild calcification of the aortic arch is seen. Degenerative changes seen throughout the thoracic spine. IMPRESSION: 1. Mild right basilar linear atelectasis. 2. Cardiomegaly. Electronically Signed   By: Virgina Norfolk M.D.   On: 07/31/2019 15:16   ECHOCARDIOGRAM COMPLETE  Result Date: 07/07/2019   ECHOCARDIOGRAM REPORT   Patient Name:   Stacey Monroe Date of Exam: 07/07/2019 Medical Rec #:  [id number]       Height:       68.0 in Accession #:    [id number]      Weight:       269.6 lb Date of Birth:  01-Sep-1960       BSA:          2.32 m Patient Age:    33 years        BP:           119/46 mmHg Patient Gender: F               HR:           71 bpm. Exam Location:  Inpatient Procedure: 2D Echo, Cardiac Doppler and Color Doppler Indications:    R60.0 Edema  History:        Patient has prior history of Echocardiogram examinations, most                 recent 04/28/2016. CHF, Signs/Symptoms:Dyspnea; Risk                 Factors:Hypertension, Diabetes, Dyslipidemia and Sleep Apnea.  Sonographer:    Jonelle Sidle Dance Referring Phys: [phone] [facility]  Sonographer Comments: Suboptimal subcostal window. IMPRESSIONS  1. Left ventricular ejection fraction, by visual estimation, is 60 to 65%. The left ventricle has normal function. There is no left ventricular hypertrophy.  2. Global right ventricle has normal systolic function.The right ventricular size is normal.  3. Left atrial size was normal.  4. Right atrial size was normal.  5. Mild mitral annular calcification.  6. The mitral valve is normal in structure. Mild mitral valve regurgitation. No evidence of mitral stenosis.  7.  The tricuspid valve is normal in structure.  8. The aortic valve is tricuspid. Aortic valve regurgitation is not visualized. No evidence of aortic valve sclerosis or stenosis.  9. The pulmonic valve was not well visualized. Pulmonic valve regurgitation is not visualized. 10. Normal LV function; mild MR. 11. The left ventricle has no regional wall motion abnormalities. FINDINGS  Left Ventricle: Left ventricular ejection fraction, by visual estimation, is 60 to 65%. The left ventricle has normal function. The left ventricle has no regional wall motion abnormalities. There is no left ventricular hypertrophy. Left ventricular diastolic parameters were normal. Normal left atrial pressure. Right Ventricle: The right ventricular size is normal.Global RV systolic function is has normal systolic function. Left Atrium: Left atrial size was normal in size. Right  Atrium: Right atrial size was normal in size Pericardium: There is no evidence of pericardial effusion. Mitral Valve: The mitral valve is normal in structure. Mild mitral annular calcification. Mild mitral valve regurgitation. No evidence of mitral valve stenosis by observation. Tricuspid Valve: The tricuspid valve is normal in structure. Tricuspid valve regurgitation is trivial. Aortic Valve: The aortic valve is tricuspid. Aortic valve regurgitation is not visualized. The aortic valve is structurally normal, with no evidence of sclerosis or stenosis. Pulmonic Valve: The pulmonic valve was not well visualized. Pulmonic valve regurgitation is not visualized. Pulmonic regurgitation is not visualized. Aorta: The aortic root is normal in size and structure. Venous: The inferior vena cava was not well visualized. IAS/Shunts: No atrial level shunt detected by color flow Doppler. Additional Comments: Normal LV function; mild MR.  LEFT VENTRICLE PLAX 2D LVIDd:         5.12 cm  Diastology LVIDs:         3.60 cm  LV e' lateral:   7.72 cm/s LV PW:         1.16 cm  LV E/e'  lateral: 18.3 LV IVS:        0.78 cm  LV e' medial:    8.70 cm/s LVOT diam:     1.80 cm  LV E/e' medial:  16.2 LV SV:         71 ml LV SV Index:   28.43 LVOT Area:     2.54 cm  RIGHT VENTRICLE RV Basal diam:  3.01 cm RV Mid diam:    2.10 cm RV S prime:     12.80 cm/s TAPSE (M-mode): 2.6 cm LEFT ATRIUM              Index       RIGHT ATRIUM           Index LA diam:        4.80 cm  2.07 cm/m  RA Area:     17.60 cm LA Vol (A2C):   127.0 ml 54.73 ml/m RA Volume:   45.60 ml  19.65 ml/m LA Vol (A4C):   61.2 ml  26.37 ml/m LA Biplane Vol: 97.1 ml  41.85 ml/m  AORTIC VALVE LVOT Vmax:   111.00 cm/s LVOT Vmean:  77.800 cm/s LVOT VTI:    0.248 m  AORTA Ao Root diam: 3.40 cm Ao Asc diam:  3.20 cm MITRAL VALVE                         TRICUSPID VALVE MV Area (PHT): 3.53 cm              TR Peak grad:   26.4 mmHg MV PHT:        62.35 msec            TR Vmax:        257.00 cm/s MV Decel Time: 215 msec MV E velocity: 141.00 cm/s 103 cm/s  SHUNTS MV A velocity: 107.00 cm/s 70.3 cm/s Systemic VTI:  0.25 m MV E/A ratio:  1.32        1.5       Systemic Diam: 1.80 cm  Kirk Ruths MD Electronically signed by Kirk Ruths MD Signature Date/Time: 07/07/2019/3:08:30 PM    Final        Subjective: She feels much improved with her dyspnea after receiving blood transfusion.  Discharge Exam: Vitals:   08/01/19 0202 08/01/19 0448 08/01/19 0500 08/01/19 0840  BP: (!) 107/50 Marland Kitchen)  97/41    Pulse: 93 86    Resp: 20 20    Temp: 99.4 F (37.4 C) 98.8 F (37.1 C)    TempSrc: Oral Oral    SpO2: 98% 95%  96%  Weight:   123 kg   Height:       She is hemodynamically stable.  Afebrile.  Adequate saturation on room air.  Lung fields are entirely clear.  Chronic peripheral edema in both her legs.  Alert and orientated with no evidence of hepatic encephalopathy.     The results of significant diagnostics from this hospitalization (including imaging, microbiology, ancillary and laboratory) are listed below for reference.      Microbiology: Recent Results (from the past 240 hour(s))  SARS CORONAVIRUS 2 (TAT 6-24 HRS) Nasopharyngeal Urine, Clean Catch     Status: None   Collection Time: 07/31/19  3:53 PM   Specimen: Urine, Clean Catch; Nasopharyngeal  Result Value Ref Range Status   SARS Coronavirus 2 NEGATIVE NEGATIVE Final    Comment: (NOTE) SARS-CoV-2 target nucleic acids are NOT DETECTED. The SARS-CoV-2 RNA is generally detectable in upper and lower respiratory specimens during the acute phase of infection. Negative results do not preclude SARS-CoV-2 infection, do not rule out co-infections with other pathogens, and should not be used as the sole basis for treatment or other patient management decisions. Negative results must be combined with clinical observations, patient history, and epidemiological information. The expected result is Negative. Fact Sheet for Patients: SugarRoll.be Fact Sheet for Healthcare Providers: https://www.woods-mathews.com/ This test is not yet approved or cleared by the [place] FDA and  has been authorized for detection and/or diagnosis of SARS-CoV-2 by FDA under an Emergency Use Authorization (EUA). This EUA will remain  in effect (meaning this test can be used) for the duration of the COVID-19 declaration under Section 56 4(b)(1) of the Act, 21 U.S.C. section 360bbb-3(b)(1), unless the authorization is terminated or revoked sooner. Performed at [facility], [place] [address] [place], [place] [address]      Labs: BNP (last 3 results) Recent Labs    07/31/19 1500  BNP 160.1*   Basic Metabolic Panel: Recent Labs  Lab 07/31/19 1500 07/31/19 1551 08/01/19 0627  NA 125*  --  126*  K 2.7*  --  4.2  CL 86*  --  88*  CO2 27  --  27  GLUCOSE 112*  --  114*  BUN 9  --  10  CREATININE 0.86  --  0.99  CALCIUM 8.7*  --  8.5*  MG  --  1.8  --    Liver Function Tests: Recent Labs  Lab 07/31/19 1500  08/01/19 0627  AST 77* 71*  ALT 37 33  ALKPHOS 121 111  BILITOT 3.6* 4.2*  PROT 7.2 6.7  ALBUMIN 2.4* 2.4*   Recent Labs  Lab 07/31/19 1500  LIPASE 63*   Recent Labs  Lab 07/31/19 1551  AMMONIA 47*   CBC: Recent Labs  Lab 07/31/19 1500 08/01/19 0627  WBC 4.4 3.9*  NEUTROABS 2.4  --   HGB 7.0* 7.1*  HCT 21.6* 21.6*  MCV 113.7* 108.0*  PLT 138* 122*   Cardiac Enzymes: No results for input(s): CKTOTAL, CKMB, CKMBINDEX, TROPONINI in the last 168 hours. BNP: Invalid input(s): POCBNP CBG: No results for input(s): GLUCAP in the last 168 hours. D-Dimer No results for input(s): DDIMER in the last 72 hours. Hgb A1c No results for input(s): HGBA1C in the last 72 hours. Lipid Profile  No results for input(s): CHOL, HDL, LDLCALC, TRIG, CHOLHDL, LDLDIRECT in the last 72 hours. Thyroid function studies No results for input(s): TSH, T4TOTAL, T3FREE, THYROIDAB in the last 72 hours.  Invalid input(s): FREET3 Anemia work up Recent Labs    08/01/19 0627  VITAMINB12 1,795*  FOLATE 3.3*   Urinalysis    Component Value Date/Time   COLORURINE YELLOW 07/31/2019 1436   APPEARANCEUR CLEAR 07/31/2019 1436   LABSPEC 1.015 07/31/2019 1436   PHURINE 5.0 07/31/2019 1436   GLUCOSEU NEGATIVE 07/31/2019 1436   HGBUR NEGATIVE 07/31/2019 1436   BILIRUBINUR NEGATIVE 07/31/2019 1436   KETONESUR NEGATIVE 07/31/2019 1436   PROTEINUR NEGATIVE 07/31/2019 1436   UROBILINOGEN 2.0 (H) 10/29/2009 2340   NITRITE POSITIVE (A) 07/31/2019 1436   LEUKOCYTESUR MODERATE (A) 07/31/2019 1436   Sepsis Labs Invalid input(s): PROCALCITONIN,  WBC,  LACTICIDVEN Microbiology Recent Results (from the past 240 hour(s))  SARS CORONAVIRUS 2 (TAT 6-24 HRS) Nasopharyngeal Urine, Clean Catch     Status: None   Collection Time: 07/31/19  3:53 PM   Specimen: Urine, Clean Catch; Nasopharyngeal  Result Value Ref Range Status   SARS Coronavirus 2 NEGATIVE NEGATIVE Final    Comment: (NOTE) SARS-CoV-2 target  nucleic acids are NOT DETECTED. The SARS-CoV-2 RNA is generally detectable in upper and lower respiratory specimens during the acute phase of infection. Negative results do not preclude SARS-CoV-2 infection, do not rule out co-infections with other pathogens, and should not be used as the sole basis for treatment or other patient management decisions. Negative results must be combined with clinical observations, patient history, and epidemiological information. The expected result is Negative. Fact Sheet for Patients: SugarRoll.be Fact Sheet for Healthcare Providers: https://www.woods-mathews.com/ This test is not yet approved or cleared by the [place] FDA and  has been authorized for detection and/or diagnosis of SARS-CoV-2 by FDA under an Emergency Use Authorization (EUA). This EUA will remain  in effect (meaning this test can be used) for the duration of the COVID-19 declaration under Section 56 4(b)(1) of the Act, 21 U.S.C. section 360bbb-3(b)(1), unless the authorization is terminated or revoked sooner. Performed at [facility], [place] [address] [place], [place] [address]      Time coordinating discharge: 25 minutes  SIGNED:   Doree Albee, MD  [facility] 08/01/2019, 8:50 AM   If 7PM-7AM, please contact night-coverage [email or web address]

## 2019-08-02 ENCOUNTER — Emergency Department (HOSPITAL_COMMUNITY): Payer: Medicare Other

## 2019-08-02 ENCOUNTER — Other Ambulatory Visit (INDEPENDENT_AMBULATORY_CARE_PROVIDER_SITE_OTHER): Payer: Self-pay | Admitting: Internal Medicine

## 2019-08-02 ENCOUNTER — Other Ambulatory Visit: Payer: Self-pay

## 2019-08-02 ENCOUNTER — Encounter (HOSPITAL_COMMUNITY): Payer: Self-pay

## 2019-08-02 ENCOUNTER — Inpatient Hospital Stay (HOSPITAL_COMMUNITY): Payer: Medicare Other

## 2019-08-02 ENCOUNTER — Inpatient Hospital Stay (HOSPITAL_COMMUNITY)
Admission: EM | Admit: 2019-08-02 | Discharge: 2019-08-08 | DRG: 433 | Disposition: A | Discharge status: Home / Self Care | Payer: Medicare Other | Attending: Family Medicine | Admitting: Family Medicine

## 2019-08-02 DIAGNOSIS — E8770 Fluid overload, unspecified: Secondary | ICD-10-CM | POA: Diagnosis present

## 2019-08-02 DIAGNOSIS — D649 Anemia, unspecified: Secondary | ICD-10-CM | POA: Diagnosis present

## 2019-08-02 DIAGNOSIS — B9620 Unspecified Escherichia coli [E. coli] as the cause of diseases classified elsewhere: Secondary | ICD-10-CM | POA: Diagnosis present

## 2019-08-02 DIAGNOSIS — I5032 Chronic diastolic (congestive) heart failure: Secondary | ICD-10-CM | POA: Diagnosis present

## 2019-08-02 DIAGNOSIS — Z720 Tobacco use: Secondary | ICD-10-CM | POA: Diagnosis not present

## 2019-08-02 DIAGNOSIS — I8510 Secondary esophageal varices without bleeding: Secondary | ICD-10-CM | POA: Diagnosis present

## 2019-08-02 DIAGNOSIS — E669 Obesity, unspecified: Secondary | ICD-10-CM | POA: Diagnosis present

## 2019-08-02 DIAGNOSIS — E876 Hypokalemia: Secondary | ICD-10-CM | POA: Diagnosis present

## 2019-08-02 DIAGNOSIS — Z79899 Other long term (current) drug therapy: Secondary | ICD-10-CM

## 2019-08-02 DIAGNOSIS — E871 Hypo-osmolality and hyponatremia: Secondary | ICD-10-CM | POA: Diagnosis not present

## 2019-08-02 DIAGNOSIS — K766 Portal hypertension: Secondary | ICD-10-CM | POA: Diagnosis present

## 2019-08-02 DIAGNOSIS — K7030 Alcoholic cirrhosis of liver without ascites: Secondary | ICD-10-CM | POA: Diagnosis not present

## 2019-08-02 DIAGNOSIS — N390 Urinary tract infection, site not specified: Secondary | ICD-10-CM | POA: Diagnosis present

## 2019-08-02 DIAGNOSIS — R0600 Dyspnea, unspecified: Secondary | ICD-10-CM

## 2019-08-02 DIAGNOSIS — K7460 Unspecified cirrhosis of liver: Secondary | ICD-10-CM | POA: Diagnosis present

## 2019-08-02 DIAGNOSIS — R0602 Shortness of breath: Secondary | ICD-10-CM | POA: Diagnosis not present

## 2019-08-02 DIAGNOSIS — K625 Hemorrhage of anus and rectum: Secondary | ICD-10-CM

## 2019-08-02 DIAGNOSIS — E785 Hyperlipidemia, unspecified: Secondary | ICD-10-CM | POA: Diagnosis present

## 2019-08-02 DIAGNOSIS — Z6841 Body Mass Index (BMI) 40.0 and over, adult: Secondary | ICD-10-CM

## 2019-08-02 DIAGNOSIS — R609 Edema, unspecified: Secondary | ICD-10-CM | POA: Diagnosis not present

## 2019-08-02 DIAGNOSIS — I110 Hypertensive heart disease with heart failure: Secondary | ICD-10-CM | POA: Diagnosis present

## 2019-08-02 DIAGNOSIS — R601 Generalized edema: Secondary | ICD-10-CM | POA: Diagnosis not present

## 2019-08-02 DIAGNOSIS — Z20822 Contact with and (suspected) exposure to covid-19: Secondary | ICD-10-CM | POA: Diagnosis present

## 2019-08-02 DIAGNOSIS — Z880 Allergy status to penicillin: Secondary | ICD-10-CM | POA: Diagnosis not present

## 2019-08-02 DIAGNOSIS — J449 Chronic obstructive pulmonary disease, unspecified: Secondary | ICD-10-CM | POA: Diagnosis present

## 2019-08-02 DIAGNOSIS — E119 Type 2 diabetes mellitus without complications: Secondary | ICD-10-CM | POA: Diagnosis present

## 2019-08-02 DIAGNOSIS — D539 Nutritional anemia, unspecified: Secondary | ICD-10-CM | POA: Diagnosis present

## 2019-08-02 DIAGNOSIS — F17210 Nicotine dependence, cigarettes, uncomplicated: Secondary | ICD-10-CM | POA: Diagnosis present

## 2019-08-02 DIAGNOSIS — N179 Acute kidney failure, unspecified: Secondary | ICD-10-CM | POA: Diagnosis present

## 2019-08-02 DIAGNOSIS — G4733 Obstructive sleep apnea (adult) (pediatric): Secondary | ICD-10-CM | POA: Diagnosis present

## 2019-08-02 DIAGNOSIS — D500 Iron deficiency anemia secondary to blood loss (chronic): Secondary | ICD-10-CM | POA: Diagnosis present

## 2019-08-02 LAB — COMPREHENSIVE METABOLIC PANEL
ALT: 32 U/L (ref 0–44)
AST: 69 U/L — ABNORMAL HIGH (ref 15–41)
Albumin: 2.6 g/dL — ABNORMAL LOW (ref 3.5–5.0)
Alkaline Phosphatase: 116 U/L (ref 38–126)
Anion gap: 12 (ref 5–15)
BUN: 9 mg/dL (ref 6–20)
CO2: 26 mmol/L (ref 22–32)
Calcium: 9.2 mg/dL (ref 8.9–10.3)
Chloride: 89 mmol/L — ABNORMAL LOW (ref 98–111)
Creatinine, Ser: 1.03 mg/dL — ABNORMAL HIGH (ref 0.44–1.00)
GFR calc Af Amer: >60 mL/min (ref >60)
GFR calc non Af Amer: 59 mL/min — ABNORMAL LOW (ref >60)
Glucose, Bld: 109 mg/dL — ABNORMAL HIGH (ref 70–99)
Potassium: 3.9 mmol/L (ref 3.5–5.1)
Sodium: 127 mmol/L — ABNORMAL LOW (ref 135–145)
Total Bilirubin: 6.4 mg/dL — ABNORMAL HIGH (ref 0.3–1.2)
Total Protein: 8.2 g/dL — ABNORMAL HIGH (ref 6.5–8.1)

## 2019-08-02 LAB — I-STAT BETA HCG BLOOD, ED (MC, WL, AP ONLY): I-stat hCG, quantitative: <5 m[IU]/mL (ref <5)

## 2019-08-02 LAB — CBC
HCT: 27.1 % — ABNORMAL LOW (ref 36.0–46.0)
Hemoglobin: 8.5 g/dL — ABNORMAL LOW (ref 12.0–15.0)
MCH: 34.8 pg — ABNORMAL HIGH (ref 26.0–34.0)
MCHC: 31.4 g/dL (ref 30.0–36.0)
MCV: 111.1 fL — ABNORMAL HIGH (ref 80.0–100.0)
Platelets: 159 10*3/uL (ref 150–400)
RBC: 2.44 MIL/uL — ABNORMAL LOW (ref 3.87–5.11)
RDW: 22.8 % — ABNORMAL HIGH (ref 11.5–15.5)
WBC: 3.6 10*3/uL — ABNORMAL LOW (ref 4.0–10.5)
nRBC: 0 % (ref 0.0–0.2)

## 2019-08-02 LAB — POC OCCULT BLOOD, ED: Fecal Occult Bld: POSITIVE — AB

## 2019-08-02 LAB — TYPE AND SCREEN
ABO/RH(D): B POS
Antibody Screen: NEGATIVE

## 2019-08-02 LAB — D-DIMER, QUANTITATIVE: D-Dimer, Quant: 2.13 ug/mL-FEU — ABNORMAL HIGH (ref 0.00–0.50)

## 2019-08-02 LAB — UREA NITROGEN, URINE: Urea Nitrogen, Ur: 530 mg/dL

## 2019-08-02 LAB — SARS CORONAVIRUS 2 (TAT 6-24 HRS): SARS Coronavirus 2: NEGATIVE

## 2019-08-02 MED ORDER — FUROSEMIDE 10 MG/ML IJ SOLN
40.0000 mg | Freq: Once | INTRAMUSCULAR | Status: AC
  Administered 2019-08-02: 15:00:00 40 mg via INTRAVENOUS
  Filled 2019-08-02: qty 4

## 2019-08-02 MED ORDER — ZOLPIDEM TARTRATE 5 MG PO TABS
5.0000 mg | ORAL_TABLET | Freq: Every evening | ORAL | Status: DC | PRN
  Administered 2019-08-02 – 2019-08-03 (×2): 5 mg via ORAL
  Filled 2019-08-02 (×2): qty 1

## 2019-08-02 MED ORDER — ALBUMIN HUMAN 25 % IV SOLN
12.5000 g | Freq: Two times a day (BID) | INTRAVENOUS | Status: AC
  Administered 2019-08-02 – 2019-08-03 (×2): 12.5 g via INTRAVENOUS
  Filled 2019-08-02 (×3): qty 50

## 2019-08-02 MED ORDER — SPIRONOLACTONE 25 MG PO TABS
100.0000 mg | ORAL_TABLET | Freq: Every day | ORAL | Status: DC
  Administered 2019-08-02 – 2019-08-05 (×4): 100 mg via ORAL
  Filled 2019-08-02: qty 1
  Filled 2019-08-02 (×4): qty 4

## 2019-08-02 MED ORDER — CIPROFLOXACIN HCL 500 MG PO TABS: 500.0000 mg | ORAL_TABLET | Freq: Two times a day (BID) | ORAL | 0 refills | Status: DC

## 2019-08-02 MED ORDER — OXYCODONE HCL 5 MG PO TABS: 5.0000 mg | ORAL_TABLET | Freq: Three times a day (TID) | ORAL | Status: DC | PRN

## 2019-08-02 MED ORDER — PANTOPRAZOLE SODIUM 40 MG PO TBEC
40.0000 mg | DELAYED_RELEASE_TABLET | Freq: Every day | ORAL | Status: DC
  Administered 2019-08-02 – 2019-08-08 (×7): 40 mg via ORAL
  Filled 2019-08-02 (×7): qty 1

## 2019-08-02 MED ORDER — IOHEXOL 350 MG/ML SOLN
100.0000 mL | Freq: Once | INTRAVENOUS | Status: AC | PRN
  Administered 2019-08-02: 100 mL via INTRAVENOUS

## 2019-08-02 NOTE — ED Triage Notes (Signed)
Pt from home with ems for c.o bilateral leg swelling and lower back pain. Pt recently admitted at [facility] for dyspnea, received a blood transfusion for hgb 7.0. Pt taking oral lasix at home. C.o mils dyspnea today. Pt a.o, resp e.u 100% on room air

## 2019-08-02 NOTE — H&P (Addendum)
History and Physical    Stacey Monroe GUR:[id number] DOB: [birth year] DOA: 08/02/2019  PCP: Aletha Halim., PA-C   Patient coming from: Home  I have personally briefly reviewed patient's old medical records in [place]  Chief Complaint: Shortness of breath, worsening bilateral lower extremity edema  HPI: Stacey Monroe is a 59 y.o. female with medical history significant of chronic alcoholic liver cirrhosis, chronic microcytic anemia, hyponatremia who presented to the emergency room today with complaints of worsening shortness of breath, bilateral lower extremity edema.  She was recently admitted at [facility] on 07/31/2019 with similar complaints.  She also had recent admission at [facility] and was seen by nephrology, GI service for volume overload, anemia.  She follows up with Dr. Almyra Free from GI.  She was admitted here in January 2021.  Per Dr. 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  These lesions were successfully ablated with APC as well as removal of multiple polyps from the colon" she was given 1 unit of PRBC at [facility] and was discharged yesterday.  However, patient says that she is continuing to have worsening shortness of breath and worsening bilateral lower extremity edema with orthopnea.  She denies having any chest pain at this time.  She denies having any nausea, vomiting, abdominal pain, diarrhea or dysuria at this time.  She denies any bright red blood in her stool.  She also had echocardiogram done January 2021 which per report was normal.   ED Course:  She was given Lasix in the ED.  Currently maintaining oxygen saturation on room air.  Lab work in the ED showed hyponatremia.  D-dimer was elevated.  CTA ordered per the ER physician.  Review of Systems: As per HPI otherwise 10 point review of systems negative.    Past Medical History:  Diagnosis Date  . Chronic diastolic heart  failure ([place])   . Chronic musculoskeletal pain   . Diabetes mellitus without complication ([place])   . Hyperlipidemia   . Hypertension   . Low back pain   . OSA on CPAP   . Osteoarthritis    knees  . Pre-eclampsia   . Sleep apnea, obstructive    patient denies  . Slipped cervical disc   . SOB (shortness of breath)     Past Surgical History:  Procedure Laterality Date  . CESAREAN SECTION    . COLONOSCOPY WITH PROPOFOL N/A 07/06/2019   Procedure: COLONOSCOPY WITH PROPOFOL;  Surgeon: Carol Ada, MD;  Location: [place];  Service: Endoscopy;  Laterality: N/A;  . ESOPHAGOGASTRODUODENOSCOPY (EGD) WITH PROPOFOL N/A 07/06/2019   Procedure: ESOPHAGOGASTRODUODENOSCOPY (EGD) WITH PROPOFOL;  Surgeon: Carol Ada, MD;  Location: [place];  Service: Endoscopy;  Laterality: N/A;  . HOT HEMOSTASIS N/A 07/06/2019   Procedure: HOT HEMOSTASIS (ARGON PLASMA COAGULATION/BICAP);  Surgeon: Carol Ada, MD;  Location: [place];  Service: Endoscopy;  Laterality: N/A;  . POLYPECTOMY  07/06/2019   Procedure: POLYPECTOMY;  Surgeon: Carol Ada, MD;  Location: [place];  Service: Endoscopy;;  . TUBAL LIGATION       reports that she has been smoking cigarettes. She has a 30.00 pack-year smoking history. She has never used smokeless tobacco. She reports that she does not drink alcohol or use drugs.  Allergies  Allergen Reactions  . Penicillin G Hives    Did it involve swelling of the face/tongue/throat, SOB, or low BP?No Did it involve sudden  or severe rash/hives, skin peeling, or any reaction on the inside of your mouth or nose? Yes Did you need to seek medical attention at a hospital or doctor's office? No When did it last happen?Child If all above answers are "NO", may proceed with cephalosporin use.   No significant family medical problems per patient.   Prior to Admission medications   Medication Sig Start Date End Date Taking? Authorizing Provider  acetaminophen (TYLENOL) 500  MG tablet Take 500 mg by mouth every 6 (six) hours as needed for mild pain, moderate pain or headache.    [provider]  ciprofloxacin (CIPRO) 500 MG tablet Take 1 tablet (500 mg total) by mouth 2 (two) times daily. 08/02/19   Doree Albee, MD  furosemide (LASIX) 40 MG tablet Take 1 tablet (40 mg total) by mouth 2 (two) times daily. Call Carol Ada, MD for refills Patient not taking: Reported on 07/31/2019 07/08/19   Willia Craze, NP  omeprazole (PRILOSEC) 10 MG capsule Take 10 mg by mouth daily.    [provider]  spironolactone (ALDACTONE) 100 MG tablet Take 100 mg by mouth daily. 04/05/16   [provider]  zolpidem (AMBIEN) 10 MG tablet Take 10 mg by mouth at bedtime. 03/24/16   [provider]    Physical Exam: Vitals:   08/02/19 1509 08/02/19 1515 08/02/19 1530 08/02/19 1545  BP: (!) 122/54 (!) 127/57 (!) 118/48 (!) 119/50  Pulse: 81 82 83 84  Resp: 13 18 15 15   Temp:      TempSrc:      SpO2: 99% 99% 99% 99%    Constitutional: Obese patient, NAD, calm, comfortable Vitals:   08/02/19 1509 08/02/19 1515 08/02/19 1530 08/02/19 1545  BP: (!) 122/54 (!) 127/57 (!) 118/48 (!) 119/50  Pulse: 81 82 83 84  Resp: 13 18 15 15   Temp:      TempSrc:      SpO2: 99% 99% 99% 99%   Eyes: PERRL, lids and conjunctivae normal ENMT: Mucous membranes are moist. Normal dentition.  Neck: normal, supple, no masses, no thyromegaly Respiratory: Decreased breath sound lower lobes otherwise clear to auscultation. Normal respiratory effort. No accessory muscle use.  Cardiovascular: Regular rate and rhythm, no murmurs. Bilateral lower extremity edema.  Abdomen: Obese female, no tenderness, no masses palpated. Bowel sounds positive.  Musculoskeletal: no clubbing / cyanosis. No joint deformity upper and lower extremities. Good ROM, no contractures. Normal muscle tone.  Skin: no rashes, lesions, ulcers. No induration Neurologic: CN 2-12 grossly intact. Sensation  intact, DTR normal. Strength 5/5 in all 4.  Psychiatric: Normal judgment and insight. Alert and oriented x 3. Normal mood.   Labs on Admission: I have personally reviewed following labs and imaging studies  CBC: Recent Labs  Lab 07/31/19 1500 08/01/19 0627 08/02/19 1311  WBC 4.4 3.9* 3.6*  NEUTROABS 2.4  --   --   HGB 7.0* 7.1* 8.5*  HCT 21.6* 21.6* 27.1*  MCV 113.7* 108.0* 111.1*  PLT 138* 122* 852   Basic Metabolic Panel: Recent Labs  Lab 07/31/19 1500 07/31/19 1551 08/01/19 0627 08/02/19 1311  NA 125*  --  126* 127*  K 2.7*  --  4.2 3.9  CL 86*  --  88* 89*  CO2 27  --  27 26  GLUCOSE 112*  --  114* 109*  BUN 9  --  10 9  CREATININE 0.86  --  0.99 1.03*  CALCIUM 8.7*  --  8.5* 9.2  MG  --  1.8  --   --    GFR: Estimated Creatinine Clearance: 80 mL/min (A) (by C-G formula based on SCr of 1.03 mg/dL (H)). Liver Function Tests: Recent Labs  Lab 07/31/19 1500 08/01/19 0627 08/02/19 1311  AST 77* 71* 69*  ALT 37 33 32  ALKPHOS 121 111 116  BILITOT 3.6* 4.2* 6.4*  PROT 7.2 6.7 8.2*  ALBUMIN 2.4* 2.4* 2.6*   Recent Labs  Lab 07/31/19 1500  LIPASE 63*   Recent Labs  Lab 07/31/19 1551  AMMONIA 47*   Coagulation Profile: No results for input(s): INR, PROTIME in the last 168 hours. Cardiac Enzymes: No results for input(s): CKTOTAL, CKMB, CKMBINDEX, TROPONINI in the last 168 hours. BNP (last 3 results) No results for input(s): PROBNP in the last 8760 hours. HbA1C: No results for input(s): HGBA1C in the last 72 hours. CBG: No results for input(s): GLUCAP in the last 168 hours. Lipid Profile: No results for input(s): CHOL, HDL, LDLCALC, TRIG, CHOLHDL, LDLDIRECT in the last 72 hours. Thyroid Function Tests: No results for input(s): TSH, T4TOTAL, FREET4, T3FREE, THYROIDAB in the last 72 hours. Anemia Panel: Recent Labs    08/01/19 0627  VITAMINB12 1,795*  FOLATE 3.3*   Urine analysis:    Component Value Date/Time   COLORURINE YELLOW 07/31/2019  1436   APPEARANCEUR CLEAR 07/31/2019 1436   LABSPEC 1.015 07/31/2019 1436   PHURINE 5.0 07/31/2019 1436   GLUCOSEU NEGATIVE 07/31/2019 1436   HGBUR NEGATIVE 07/31/2019 1436   BILIRUBINUR NEGATIVE 07/31/2019 1436   KETONESUR NEGATIVE 07/31/2019 1436   PROTEINUR NEGATIVE 07/31/2019 1436   UROBILINOGEN 2.0 (H) 10/29/2009 2340   NITRITE POSITIVE (A) 07/31/2019 1436   LEUKOCYTESUR MODERATE (A) 07/31/2019 1436    Radiological Exams on Admission: DG Chest 2 View  Result Date: 08/02/2019 CLINICAL DATA:  Short of breath for several days, dry cough, mid chest pain EXAM: CHEST - 2 VIEW COMPARISON:  07/31/2019 FINDINGS: Frontal and lateral views of the chest demonstrate a stable cardiac silhouette. No acute airspace disease, effusion, or pneumothorax. The lungs are hyperinflated. Subsegmental atelectasis or scarring within the right middle lobe. No acute bony abnormalities. IMPRESSION: 1. Stable exam, no acute process. Electronically Signed   By: Randa Ngo M.D.   On: 08/02/2019 13:09    Assessment/Plan Principal Problem:   Shortness of breath Active Problems:   Cirrhosis (HCC)   Tobacco abuse   Anemia   Hyponatremia   Edema   Shortness of breath: - She has been having chronic shortness of breath.  Denies having any chest pain.  She already had echocardiogram in January 2021 which was normal.  It is possible she also has underlying COPD given her history of tobacco abuse.  Clinically appears volume overloaded.  Chest x-ray does not show any acute abnormalities. -She is already given 1 dose of Lasix in the emergency room.  Will monitor. -Her D-dimer was also elevated.  She denies having any chest pain.  CTA ordered per the ER physician. -She may need more diuresis but will hold off for now since she already got Lasix and CTA being planned. -We will order some albumin to help with her lower extremity edema.  Edema: -Patient complaining of worsening lower extremity edema.  Suspect likely  secondary to underlying alcoholic cirrhosis. -She was given 1 dose of Lasix in the emergency room.  Will hold off for now since CTA also being planned. -Will order albumin. -She has been seen by nephrology in the past.  Please consult nephrology in  a.m. -Continue to monitor intake/output.  Alcoholic liver cirrhosis: -Patient has chronic alcoholic liver cirrhosis.  No evidence of ascites at this time. -She follows up with Dr. Almyra Free.  Per ER physician Dr. Benson Norway has been consulted.  Hyponatremia: -She has chronic hyponatremia.  Her sodium is always been low.  Likely secondary to her liver cirrhosis/hypervolemia. -She already got Lasix in the ER.  Will hold off for now. -We will order albumin.  She has been seen by nephrology in the past.  Please consult nephrology in a.m.  Anemia: -She follows up with Dr. Almyra Free from GI.  She was seen by him in January 2021.  Per Dr. 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  These lesions were successfully ablated with APC as well as removal of multiple polyps from the colon. -She is high risk for bleeding.  She was given 1 unit PRBCs recently at [facility]. -Currently denies having any blood in her stool.  Continue to monitor hemoglobin.   -GI consulted by the ER physician.  Further management per GI.  UTI? -UA done at [facility] on 07/31/2019 showed moderate leukocyte esterase, positive nitrite.  She was given ceftriaxone and discharged on ciprofloxacin but the patient says she is not taking the ciprofloxacin.  No urine cultures. -Currently afebrile without any leukocytosis.  Denies having any urinary symptoms at this time. -We will hold off on antibiotics. If she becomes symptomatic consider repeating UA and urine cultures.  Nicotine abuse: -Patient counseled on smoking cessation.  DVT prophylaxis: SCD due to high risk of bleeding (if CTA positive for pulmonary  embolus then we may need to consider risk versus benefits and start her on anticoagulation after discussing with GI) Code Status: Full Family Communication: No family at bedside Disposition Plan:  Consults called: GI was consulted by ER physician Admission status: Inpatient Plan of care discussed with the patient and she verbalized understanding.  Yaakov Guthrie MD [facility] Pager on amion  If 7PM-7AM, please contact night-coverage [email or web address] Password [facility]  08/02/2019, 3:54 PM

## 2019-08-02 NOTE — ED Provider Notes (Addendum)
[facility] EMERGENCY DEPARTMENT Provider Note   CSN: [id number] Arrival date & time: 08/02/19  1221     History Chief Complaint  Patient presents with  . Leg Swelling  . Back Pain    Stacey Monroe is a 59 y.o. female.  HPI 59 yo female co dyspnea and leg swelling.  Patient states symptoms began 5-6 ;weeks ago and had hypokalemia and anemia.  Patient continues to have swelling and dyspnea  07/06/19 EGD and colonoscopy for IDA Small (< 5 mm) esophageal varices. - Gastric antral vascular ectasia without bleeding. Treated with a monopolar probe. - Portal hypertensive gastropathy.   Colonoscopy : complete exam, good prep Five 2 to 8 mm polyps in the sigmoid colon, in the transverse colon and in the cecum, removed with a cold snare. Resected and retrieved. - Multiple non-bleeding colonic angiodysplastic lesions. Treated with a monopolar probe. - Diverticulosis in the sigmoid colon.    Review of records reveals recent gi work up for gi bleed:     Past Medical History:  Diagnosis Date  . Chronic diastolic heart failure ([place])   . Chronic musculoskeletal pain   . Diabetes mellitus without complication ([place])   . Hyperlipidemia   . Hypertension   . Low back pain   . OSA on CPAP   . Osteoarthritis    knees  . Pre-eclampsia   . Sleep apnea, obstructive    patient denies  . Slipped cervical disc   . SOB (shortness of breath)     Patient Active Problem List   Diagnosis Date Noted  . Anemia 07/31/2019  . Hyponatremia 07/31/2019  . Hypokalemia 07/31/2019  . Decompensated hepatic cirrhosis ([place]) 07/05/2019  . Cirrhosis ([place]) 04/06/2016  . Tobacco abuse 04/06/2016  . Stress 04/06/2016    Past Surgical History:  Procedure Laterality Date  . CESAREAN SECTION    . COLONOSCOPY WITH PROPOFOL N/A 07/06/2019   Procedure: COLONOSCOPY WITH PROPOFOL;  Surgeon: Carol Ada, MD;  Location: [place];  Service: Endoscopy;  Laterality: N/A;  .  ESOPHAGOGASTRODUODENOSCOPY (EGD) WITH PROPOFOL N/A 07/06/2019   Procedure: ESOPHAGOGASTRODUODENOSCOPY (EGD) WITH PROPOFOL;  Surgeon: Carol Ada, MD;  Location: [place];  Service: Endoscopy;  Laterality: N/A;  . HOT HEMOSTASIS N/A 07/06/2019   Procedure: HOT HEMOSTASIS (ARGON PLASMA COAGULATION/BICAP);  Surgeon: Carol Ada, MD;  Location: [place];  Service: Endoscopy;  Laterality: N/A;  . POLYPECTOMY  07/06/2019   Procedure: POLYPECTOMY;  Surgeon: Carol Ada, MD;  Location: [facility] ENDOSCOPY;  Service: Endoscopy;;  . TUBAL LIGATION       OB History   No obstetric history on file.     No family history on file.  Social History   Tobacco Use  . Smoking status: Current Every Day Smoker    Packs/day: 1.00    Years: 30.00    Pack years: 30.00    Types: Cigarettes  . Smokeless tobacco: Never Used  Substance Use Topics  . Alcohol use: No  . Drug use: No    Home Medications Prior to Admission medications   Medication Sig Start Date End Date Taking? Authorizing Provider  acetaminophen (TYLENOL) 500 MG tablet Take 500 mg by mouth every 6 (six) hours as needed for mild pain, moderate pain or headache.    [provider]  ciprofloxacin (CIPRO) 500 MG tablet Take 1 tablet (500 mg total) by mouth 2 (two) times daily. 08/02/19   Doree Albee, MD  furosemide (LASIX) 40 MG tablet Take 1 tablet (40 mg  total) by mouth 2 (two) times daily. Call Carol Ada, MD for refills Patient not taking: Reported on 07/31/2019 07/08/19   Willia Craze, NP  omeprazole (PRILOSEC) 10 MG capsule Take 10 mg by mouth daily.    [provider]  spironolactone (ALDACTONE) 100 MG tablet Take 100 mg by mouth daily. 04/05/16   [provider]  zolpidem (AMBIEN) 10 MG tablet Take 10 mg by mouth at bedtime. 03/24/16   [provider]    Allergies    Penicillin g  Review of Systems   Review of Systems  All other systems reviewed and are negative.   Physical  Exam Updated Vital Signs BP (!) 133/52 (BP Location: Left Arm)   Pulse 87   Temp 98.1 F (36.7 C) (Oral)   Resp 17   SpO2 95%   Physical Exam Vitals and nursing note reviewed.  Constitutional:      Appearance: Normal appearance. She is obese.  HENT:     Head: Normocephalic.     Right Ear: External ear normal.     Left Ear: External ear normal.     Nose: Nose normal.     Mouth/Throat:     Mouth: Mucous membranes are moist.  Eyes:     Pupils: Pupils are equal, round, and reactive to light.  Cardiovascular:     Rate and Rhythm: Normal rate and regular rhythm.  Pulmonary:     Effort: Pulmonary effort is normal.     Breath sounds: Normal breath sounds.  Abdominal:     General: Bowel sounds are normal. There is no distension.     Palpations: Abdomen is soft.     Tenderness: There is no abdominal tenderness.  Musculoskeletal:        General: Normal range of motion.     Cervical back: Normal range of motion.     Right lower leg: Edema present.     Left lower leg: Edema present.  Skin:    General: Skin is warm.     Capillary Refill: Capillary refill takes less than 2 seconds.  Neurological:     General: No focal deficit present.     Mental Status: She is alert.  Psychiatric:        Mood and Affect: Mood normal.     ED Results / Procedures / Treatments   Labs (all labs ordered are listed, but only abnormal results are displayed) Labs Reviewed  CBC  COMPREHENSIVE METABOLIC PANEL  D-DIMER, QUANTITATIVE (NOT AT [facility])  OCCULT BLOOD X 1 CARD TO LAB, STOOL  I-STAT BETA HCG BLOOD, ED ([facility], [facility], [facility] ONLY)  TYPE AND SCREEN    EKG None  Radiology DG Chest 2 View  Result Date: 08/02/2019 CLINICAL DATA:  Short of breath for several days, dry cough, mid chest pain EXAM: CHEST - 2 VIEW COMPARISON:  07/31/2019 FINDINGS: Frontal and lateral views of the chest demonstrate a stable cardiac silhouette. No acute airspace disease, effusion, or pneumothorax. The lungs are hyperinflated.  Subsegmental atelectasis or scarring within the right middle lobe. No acute bony abnormalities. IMPRESSION: 1. Stable exam, no acute process. Electronically Signed   By: Randa Ngo M.D.   On: 08/02/2019 13:09   DG Chest Port 1 View  Result Date: 07/31/2019 CLINICAL DATA:  Shortness of breath and lower extremity swelling. EXAM: PORTABLE CHEST 1 VIEW COMPARISON:  None. FINDINGS: Mild linear atelectasis is seen within the right lung base. There is no evidence of a pleural effusion or pneumothorax. There is  mild to moderate severity enlargement of the cardiac silhouette. Mild calcification of the aortic arch is seen. Degenerative changes seen throughout the thoracic spine. IMPRESSION: 1. Mild right basilar linear atelectasis. 2. Cardiomegaly. Electronically Signed   By: Virgina Norfolk M.D.   On: 07/31/2019 15:16    Procedures Procedures (including critical care time)  Medications Ordered in ED Medications - No data to display  ED Course  I have reviewed the triage vital signs and the nursing notes.  Pertinent labs & imaging results that were available during my care of the patient were reviewed by me and considered in my medical decision making (see chart for details).    MDM Rules/Calculators/A&P                       GI bleeding Dyspnea - likely secondary to 1-elevated d-dimer, cta pending Peripheral edema-lasix 40 mg being given Discussed with Dr. Benson Norway.  He advises that patient needs inpatient management of her edema.  He states that when he has tried to diurese her she has become hyponatremic.  He will see her in follow-up from GI. Discussed with hospitalist and will see for admission Final Clinical Impression(s) / ED Diagnoses Final diagnoses:  Dyspnea, unspecified type  Rectal bleeding  Peripheral edema    Rx / DC Orders ED Discharge Orders    None       Pattricia Boss, MD 08/02/19 1506    Pattricia Boss, MD 08/02/19 [phone]

## 2019-08-02 NOTE — Progress Notes (Signed)
Stacey Monroe is a long term patient of mine with a history of ETOH cirrhosis, but she has been abstinent for the better part of a decade.  She stopped drinking ETOH shortly after her diagnosis.  Initially she had issues with ascites and lower extremity edema, but this was successfully managed with diuretics.  Fluid overload was not a problem until late last year.  She presented to asymmetrical lower extremity edema and an ultrasound of the leg was negative for DVT.  She subsequently developed symmetrical LE swelling and a decline in her sodium.  Her weight increased by 30 lbs.  The decision was made to admit her in January for further evaluation of her swelling and she was incidentally noted to have an anemia.  An EGD/colonoscopy was positive for GAVE, proximal colonic AVMs, and colonic polyps.  The abnormal vasculature was ablated and the colonic polyps were removed.  Unfortunately she remained anemic and her baseline was usually in the 10-11 range.  Also, a new finding is the hyponatremia.  Over the past several years her sodium values were in the mid 130's.  Renal was consulted during the last admission and her diuretic therapy was intensified with IV dosing as well as obtaining albumin.  As an outpatient she was not able to be adequately managed with Step II diuretics.  She also had difficulty traveling to the office, which made management of her fluid overload and hyponatremia very difficult.  Unfortunately, in the hospital her back pain worsened with being in a poor hospital bed.  As a result of the pain she left the hospital, which was against my wishes.  However, this occurred on a weekend that I was not on call.  She progressively worsened at home and she started to complain about SOB, a new complaint.  Curiously, her weight remained stable in the low 260 lbs range.  Her current admission weight is at 263 lbs.  The patient was in the office earlier this week and she apologized for leaving the hospital in  January.  She was instructed to call EMS to bring her to [facility], but EMS brought her to [facility] as a result of [id number] policies.  I was not able to reach the admitting physician at [facility] before she was discharged yesterday.  She went to her son's house, which is in [place] [facility] and she called EMS today.  EMS then transported her to [facility].  The plan for this admission is to improve her diuresis.  The only conclusion is that her edema is from her cirrhosis, but there is no evidence of ascites.  Her echo last month was also normal.  Her weight needs to be followed on a daily basis.  She was on a <2 gram sodium diet at home and this is her current admission diet.  I agree with having Renal help with her diuresis.  Her HGB will also be monitored as the anticipation was that her HGB was going to increase after the procedures/interventions.

## 2019-08-03 LAB — COMPREHENSIVE METABOLIC PANEL
ALT: 27 U/L (ref 0–44)
AST: 58 U/L — ABNORMAL HIGH (ref 15–41)
Albumin: 2.1 g/dL — ABNORMAL LOW (ref 3.5–5.0)
Alkaline Phosphatase: 77 U/L (ref 38–126)
Anion gap: 12 (ref 5–15)
BUN: 9 mg/dL (ref 6–20)
CO2: 24 mmol/L (ref 22–32)
Calcium: 8.6 mg/dL — ABNORMAL LOW (ref 8.9–10.3)
Chloride: 93 mmol/L — ABNORMAL LOW (ref 98–111)
Creatinine, Ser: 1.08 mg/dL — ABNORMAL HIGH (ref 0.44–1.00)
GFR calc Af Amer: >60 mL/min (ref >60)
GFR calc non Af Amer: 56 mL/min — ABNORMAL LOW (ref >60)
Glucose, Bld: 104 mg/dL — ABNORMAL HIGH (ref 70–99)
Potassium: 4.2 mmol/L (ref 3.5–5.1)
Sodium: 129 mmol/L — ABNORMAL LOW (ref 135–145)
Total Bilirubin: 4.1 mg/dL — ABNORMAL HIGH (ref 0.3–1.2)
Total Protein: 6.5 g/dL (ref 6.5–8.1)

## 2019-08-03 LAB — CBC
HCT: 20.9 % — ABNORMAL LOW (ref 36.0–46.0)
Hemoglobin: 7 g/dL — ABNORMAL LOW (ref 12.0–15.0)
MCH: 35.7 pg — ABNORMAL HIGH (ref 26.0–34.0)
MCHC: 33.5 g/dL (ref 30.0–36.0)
MCV: 106.6 fL — ABNORMAL HIGH (ref 80.0–100.0)
Platelets: 122 10*3/uL — ABNORMAL LOW (ref 150–400)
RBC: 1.96 MIL/uL — ABNORMAL LOW (ref 3.87–5.11)
RDW: 22.4 % — ABNORMAL HIGH (ref 11.5–15.5)
WBC: 3.6 10*3/uL — ABNORMAL LOW (ref 4.0–10.5)
nRBC: 0 % (ref 0.0–0.2)

## 2019-08-03 LAB — URINE CULTURE: Culture: >=100000 — AB

## 2019-08-03 LAB — HEMOGLOBIN AND HEMATOCRIT, BLOOD
HCT: 22 % — ABNORMAL LOW (ref 36.0–46.0)
Hemoglobin: 7.2 g/dL — ABNORMAL LOW (ref 12.0–15.0)

## 2019-08-03 MED ORDER — NICOTINE 21 MG/24HR TD PT24
21.0000 mg | MEDICATED_PATCH | Freq: Every day | TRANSDERMAL | Status: DC
  Administered 2019-08-03 – 2019-08-08 (×6): 21 mg via TRANSDERMAL
  Filled 2019-08-03 (×6): qty 1

## 2019-08-03 MED ORDER — FUROSEMIDE 10 MG/ML IJ SOLN
40.0000 mg | Freq: Two times a day (BID) | INTRAMUSCULAR | Status: DC
  Administered 2019-08-03 – 2019-08-06 (×8): 40 mg via INTRAVENOUS
  Filled 2019-08-03 (×8): qty 4

## 2019-08-03 MED ORDER — SULFAMETHOXAZOLE-TRIMETHOPRIM 400-80 MG PO TABS
2.0000 | ORAL_TABLET | Freq: Two times a day (BID) | ORAL | Status: DC
  Administered 2019-08-03 – 2019-08-04 (×3): 2 via ORAL
  Filled 2019-08-03 (×3): qty 2

## 2019-08-03 MED ORDER — FUROSEMIDE 10 MG/ML IJ SOLN
60.0000 mg | Freq: Once | INTRAMUSCULAR | Status: AC
  Administered 2019-08-03: 60 mg via INTRAVENOUS
  Filled 2019-08-03: qty 6

## 2019-08-03 MED ORDER — IPRATROPIUM-ALBUTEROL 0.5-2.5 (3) MG/3ML IN SOLN: 3.0000 mL | RESPIRATORY_TRACT | Status: DC | PRN

## 2019-08-03 NOTE — Progress Notes (Signed)
PROGRESS NOTE    Stacey Monroe  OVF:[id number] DOB: Jan 30, 1961 DOA: 08/02/2019 PCP: Aletha Halim., PA-C   Brief Narrative:  HPI On 08/02/2019 by Dr. Yaakov Guthrie Stacey Monroe is a 59 y.o. female with medical history significant of chronic alcoholic liver cirrhosis, chronic microcytic anemia, hyponatremia who presented to the emergency room today with complaints of worsening shortness of breath, bilateral lower extremity edema.  She was recently admitted at [facility] on 07/31/2019 with similar complaints.  She also had recent admission at [facility] and was seen by nephrology, GI service for volume overload, anemia.  She follows up with Dr. Almyra Free from GI.  She was admitted here in January 2021.  Per Dr. 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 These lesions were successfully ablated with APC as well as removal of multiple polyps from the colon" she was given 1 unit of PRBC at [facility] and was discharged yesterday.  However, patient says that she is continuing to have worsening shortness of breath and worsening bilateral lower extremity edema with orthopnea.  She denies having any chest pain at this time.  She denies having any nausea, vomiting, abdominal pain, diarrhea or dysuria at this time.  She denies any bright red blood in her stool.  She also had echocardiogram done January 2021 which per report was normal.   Interim history Admitted for shortness of breath and edema likely secondary to liver cirrhosis.  Gastroenterology consulted. Assessment & Plan   Dyspnea/edema, likely secondary to liver cirrhosis -Patient has had chronic shortness of breath, denies any chest pain -currently maintaining oxygen saturations in the high 90s on room air -Echocardiogram July 07, 2019 showed an EF of 60-65%. -Patient appears to be clinically volume overloaded -Chest x-ray unremarkable -Patient does have a  smoking history as well as COPD -Was given IV Lasix -D-dimer elevated, CTA chest obtained, negative for PE. -Discussed with nephrology, Dr. Johnney Ou, who recommended continuing IV Lasix 40 mg twice daily.  May consider torsemide 40 mg daily on discharge or possibly increasing Lasix to 60 mg twice daily. -Continue to monitor intake and output, daily weights  Alcoholic liver cirrhosis -Gastroenterology consulted and appreciated -Continue spironolactone and IV Lasix  Hyponatremia -Appears to be chronic -Suspect secondary to liver cirrhosis and hypervolemia -Continue to monitor BMP  Chronic macrocytic Anemia -EGD in January 21 showed significant G AVE, AVMs in the proximal colon.  Successfully ablated with APC as well as removal of multiple polyps from the colon. -Patient recently received 1 unit PRBC at recent hospitalization a couple days ago at [facility] -Hemoglobin currently 7.2, continue to monitor CBC  UTI, ESBL E. Coli -Patient was noted to have UTI on recent admission, urine culture>100K E. coli, ESBL -Wwill place on Bactrim for 3 days and monitor renal function closely  Nicotine abuse -Discussed smoking cessation -Nicotine patch ordered  DVT Prophylaxis  SCDs  Code Status: Full  Family Communication: None at bedside  Disposition Plan: Admitted from home for SOB secondary to volume overload.  GI consulted, currently on IV diuresis.  Disposition to be determined.   Consultants Gastroenterology Nephrology via phone  Procedures  None  Antibiotics   Anti-infectives (From admission, onward)   Start     Dose/Rate Route Frequency Ordered Stop   08/03/19 1200  sulfamethoxazole-trimethoprim (BACTRIM) 400-80 MG per tablet 2 tablet     2 tablet Oral Every 12 hours 08/03/19 1132  08/06/19 6578      Subjective:   Stacey Monroe seen and examined today.  Continues to have shortness of breath and swelling. Denies current chest pain, abdominal pain, N/V/D/C, dizziness,  headache.    Objective:   Vitals:   08/02/19 1713 08/02/19 2025 08/03/19 0400 08/03/19 0731  BP: 124/60 (!) 119/42  (!) 114/35  Pulse: 89 85  87  Resp:    20  Temp: 98.2 F (36.8 C) 98 F (36.7 C)  98.2 F (36.8 C)  TempSrc: Oral     SpO2: 100% 98% 96% 97%    Intake/Output Summary (Last 24 hours) at 08/03/2019 1429 Last data filed at 08/03/2019 0732 Gross per 24 hour  Intake 300 ml  Output 2000 ml  Net -1700 ml   There were no vitals filed for this visit.  Exam  General: Well developed, well nourished, NAD, appears stated age  HEENT: NCAT, mucous membranes moist.   Cardiovascular: S1 S2 auscultated, RRR  Respiratory: Clear to auscultation bilaterally with equal chest rise  Abdomen: Soft, nontender, nondistended, + bowel sounds  Extremities: warm dry without cyanosis clubbing. ++LE edema B/L   Neuro: AAOx3, nonfocal  Psych: Normal affect and demeanor   Data Reviewed: I have personally reviewed following labs and imaging studies  CBC: Recent Labs  Lab 07/31/19 1500 08/01/19 0627 08/02/19 1311 08/03/19 0317 08/03/19 1155  WBC 4.4 3.9* 3.6* 3.6*  --   NEUTROABS 2.4  --   --   --   --   HGB 7.0* 7.1* 8.5* 7.0* 7.2*  HCT 21.6* 21.6* 27.1* 20.9* 22.0*  MCV 113.7* 108.0* 111.1* 106.6*  --   PLT 138* 122* 159 122*  --    Basic Metabolic Panel: Recent Labs  Lab 07/31/19 1500 07/31/19 1551 08/01/19 0627 08/02/19 1311 08/03/19 0317  NA 125*  --  126* 127* 129*  K 2.7*  --  4.2 3.9 4.2  CL 86*  --  88* 89* 93*  CO2 27  --  27 26 24   GLUCOSE 112*  --  114* 109* 104*  BUN 9  --  10 9 9   CREATININE 0.86  --  0.99 1.03* 1.08*  CALCIUM 8.7*  --  8.5* 9.2 8.6*  MG  --  1.8  --   --   --    GFR: Estimated Creatinine Clearance: 76.3 mL/min (A) (by C-G formula based on SCr of 1.08 mg/dL (H)). Liver Function Tests: Recent Labs  Lab 07/31/19 1500 08/01/19 0627 08/02/19 1311 08/03/19 0317  AST 77* 71* 69* 58*  ALT 37 33 32 27  ALKPHOS 121 111 116 77    BILITOT 3.6* 4.2* 6.4* 4.1*  PROT 7.2 6.7 8.2* 6.5  ALBUMIN 2.4* 2.4* 2.6* 2.1*   Recent Labs  Lab 07/31/19 1500  LIPASE 63*   Recent Labs  Lab 07/31/19 1551  AMMONIA 47*   Coagulation Profile: No results for input(s): INR, PROTIME in the last 168 hours. Cardiac Enzymes: No results for input(s): CKTOTAL, CKMB, CKMBINDEX, TROPONINI in the last 168 hours. BNP (last 3 results) No results for input(s): PROBNP in the last 8760 hours. HbA1C: No results for input(s): HGBA1C in the last 72 hours. CBG: No results for input(s): GLUCAP in the last 168 hours. Lipid Profile: No results for input(s): CHOL, HDL, LDLCALC, TRIG, CHOLHDL, LDLDIRECT in the last 72 hours. Thyroid Function Tests: No results for input(s): TSH, T4TOTAL, FREET4, T3FREE, THYROIDAB in the last 72 hours. Anemia Panel: Recent Labs    08/01/19 [phone]  [id number] 1,795*  FOLATE 3.3*   Urine analysis:    Component Value Date/Time   COLORURINE YELLOW 07/31/2019 1436   APPEARANCEUR CLEAR 07/31/2019 1436   LABSPEC 1.015 07/31/2019 1436   PHURINE 5.0 07/31/2019 1436   GLUCOSEU NEGATIVE 07/31/2019 1436   HGBUR NEGATIVE 07/31/2019 1436   BILIRUBINUR NEGATIVE 07/31/2019 1436   KETONESUR NEGATIVE 07/31/2019 1436   PROTEINUR NEGATIVE 07/31/2019 1436   UROBILINOGEN 2.0 (H) 10/29/2009 2340   NITRITE POSITIVE (A) 07/31/2019 1436   LEUKOCYTESUR MODERATE (A) 07/31/2019 1436   Sepsis Labs: @LABRCNTIP (procalcitonin:4,lacticidven:4)  ) Recent Results (from the past 240 hour(s))  SARS CORONAVIRUS 2 (TAT 6-24 HRS) Nasopharyngeal Urine, Clean Catch     Status: None   Collection Time: 07/31/19  3:53 PM   Specimen: Urine, Clean Catch; Nasopharyngeal  Result Value Ref Range Status   SARS Coronavirus 2 NEGATIVE NEGATIVE Final    Comment: (NOTE) SARS-CoV-2 target nucleic acids are NOT DETECTED. The SARS-CoV-2 RNA is generally detectable in upper and lower respiratory specimens during the acute phase of infection.  Negative results do not preclude SARS-CoV-2 infection, do not rule out co-infections with other pathogens, and should not be used as the sole basis for treatment or other patient management decisions. Negative results must be combined with clinical observations, patient history, and epidemiological information. The expected result is Negative. Fact Sheet for Patients: SugarRoll.be Fact Sheet for Healthcare Providers: https://www.woods-mathews.com/ This test is not yet approved or cleared by the [place] FDA and  has been authorized for detection and/or diagnosis of SARS-CoV-2 by FDA under an Emergency Use Authorization (EUA). This EUA will remain  in effect (meaning this test can be used) for the duration of the COVID-19 declaration under Section 56 4(b)(1) of the Act, 21 U.S.C. section 360bbb-3(b)(1), unless the authorization is terminated or revoked sooner. Performed at [facility], [place] [address] [place], [place] [address]   Culture, Urine     Status: Abnormal   Collection Time: 07/31/19  9:26 PM   Specimen: Urine, Clean Catch  Result Value Ref Range Status   Specimen Description   Final    URINE, CLEAN CATCH Performed at [facility], [address], [place], [place] [address]    Special Requests   Final    NONE Performed at [facility], [address] [place], [place] [address]    Culture (A)  Final    >=100,000 COLONIES/mL ESCHERICHIA COLI Confirmed Extended Spectrum Beta-Lactamase Producer (ESBL).  In bloodstream infections from ESBL organisms, carbapenems are preferred over piperacillin/tazobactam. They are shown to have a lower risk of mortality.    Report Status 08/03/2019 FINAL  Final   Organism ID, Bacteria ESCHERICHIA COLI (A)  Final      Susceptibility   Escherichia coli - MIC*    AMPICILLIN >=32 RESISTANT Resistant     CEFAZOLIN >=64 RESISTANT Resistant     CEFTRIAXONE >=64 RESISTANT Resistant      CIPROFLOXACIN 1 SENSITIVE Sensitive     GENTAMICIN <=1 SENSITIVE Sensitive     IMIPENEM <=0.25 SENSITIVE Sensitive     NITROFURANTOIN <=16 SENSITIVE Sensitive     TRIMETH/SULFA <=20 SENSITIVE Sensitive     AMPICILLIN/SULBACTAM 8 SENSITIVE Sensitive     PIP/TAZO <=4 SENSITIVE Sensitive     * >=100,000 COLONIES/mL ESCHERICHIA COLI  SARS CORONAVIRUS 2 (TAT 6-24 HRS) Nasopharyngeal Nasopharyngeal Swab     Status: None   Collection Time: 08/02/19  3:22 PM   Specimen: Nasopharyngeal Swab  Result Value Ref Range Status  SARS Coronavirus 2 NEGATIVE NEGATIVE Final    Comment: (NOTE) SARS-CoV-2 target nucleic acids are NOT DETECTED. The SARS-CoV-2 RNA is generally detectable in upper and lower respiratory specimens during the acute phase of infection. Negative results do not preclude SARS-CoV-2 infection, do not rule out co-infections with other pathogens, and should not be used as the sole basis for treatment or other patient management decisions. Negative results must be combined with clinical observations, patient history, and epidemiological information. The expected result is Negative. Fact Sheet for Patients: SugarRoll.be Fact Sheet for Healthcare Providers: https://www.woods-mathews.com/ This test is not yet approved or cleared by the [place] FDA and  has been authorized for detection and/or diagnosis of SARS-CoV-2 by FDA under an Emergency Use Authorization (EUA). This EUA will remain  in effect (meaning this test can be used) for the duration of the COVID-19 declaration under Section 56 4(b)(1) of the Act, 21 U.S.C. section 360bbb-3(b)(1), unless the authorization is terminated or revoked sooner. Performed at [facility], [place] [address], [place], [place] [address]       Radiology Studies: DG Chest 2 View  Result Date: 08/02/2019 CLINICAL DATA:  Short of breath for several days, dry cough, mid chest pain EXAM: CHEST - 2  VIEW COMPARISON:  07/31/2019 FINDINGS: Frontal and lateral views of the chest demonstrate a stable cardiac silhouette. No acute airspace disease, effusion, or pneumothorax. The lungs are hyperinflated. Subsegmental atelectasis or scarring within the right middle lobe. No acute bony abnormalities. IMPRESSION: 1. Stable exam, no acute process. Electronically Signed   By: Randa Ngo M.D.   On: 08/02/2019 13:09   CT Angio Chest PE W and/or Wo Contrast  Result Date: 08/02/2019 CLINICAL DATA:  Short of breath. Bilateral leg swelling. EXAM: CT ANGIOGRAPHY CHEST WITH CONTRAST TECHNIQUE: Multidetector CT imaging of the chest was performed using the standard protocol during bolus administration of intravenous contrast. Multiplanar CT image reconstructions and MIPs were obtained to evaluate the vascular anatomy. CONTRAST:  164mL OMNIPAQUE IOHEXOL 350 MG/ML SOLN COMPARISON:  None FINDINGS: Cardiovascular: Satisfactory opacification of the pulmonary arteries to the segmental level. No evidence of pulmonary embolism. Normal heart size. No pericardial effusion. Aortic atherosclerosis. Lad and RCA coronary artery calcifications. Mediastinum/Nodes: The Normal appearance of the thyroid gland. The trachea appears patent and is midline. Normal appearance of the esophagus. No supraclavicular, axillary, mediastinal or hilar adenopathy. Lungs/Pleura: Subsegmental atelectasis is identified within the right middle lobe. Cavitary lung mass within the posteromedial right lower lobe is identified measuring 3.4 x 3.4 cm, image 101/7. Small emphysematous bulla noted in the right apex. Upper Abdomen: Marked scratch set advanced changes of cirrhosis identified. Attenuation and enhancement of the liver is diffusely heterogeneous. Phase of contrast administration limits assessment for underlying liver lesion. Gallstones. Musculoskeletal: No chest wall abnormality. No acute or significant osseous findings. Review of the MIP images confirms the  above findings. IMPRESSION: 1. No evidence for acute pulmonary embolus. 2. Cavitary lung mass within the posteromedial right lower lobe is identified. Differential considerations include cavitary pneumonia versus cavitary neoplasm. Consider one of the following in 3 months for both low-risk and high-risk individuals: (a) repeat chest CT, (b) follow-up PET-CT, or (c) referral to multi disciplinary thoracic group. 3. Advanced changes of cirrhosis. 4. Gallstones. 5. Multi vessel coronary artery calcifications. Aortic Atherosclerosis (ICD10-I70.0). Electronically Signed   By: Kerby Moors M.D.   On: 08/02/2019 18:33     Scheduled Meds: . furosemide  40 mg Intravenous BID  . nicotine  21 mg  Transdermal Daily  . pantoprazole  40 mg Oral Daily  . spironolactone  100 mg Oral Daily  . sulfamethoxazole-trimethoprim  2 tablet Oral Q12H   Continuous Infusions:   LOS: 1 day   Time Spent in minutes   45 minutes  Verita Kuroda D.O. on 08/03/2019 at 2:29 PM  Between 7am to 7pm - Please see pager noted on [email or web address]  After 7pm go to [email or web address]  And look for the night coverage person covering for me after hours  [facility] Office  [phone]

## 2019-08-03 NOTE — Progress Notes (Signed)
Pt called RN into room, RN noticed patient had increased audible wheezing and SOB. Patient is currently on room air and O2 levels were checked which read 96%. Patients legs are noticeably more swollen since beginning of this RNs shift. Patient c/o of tenderness when touching legs and pain when moving. NP Bodenheimer was notified of patient condition.

## 2019-08-03 NOTE — Progress Notes (Signed)
Subjective: Feeling better with her SOB.  It has improved.  Objective: Vital signs in last 24 hours: Temp:  [98 F (36.7 C)-98.2 F (36.8 C)] 98.2 F (36.8 C) (02/05 0731) Pulse Rate:  [81-89] 87 (02/05 0731) Resp:  [13-21] 20 (02/05 0731) BP: (113-133)/(35-60) 114/35 (02/05 0731) SpO2:  [95 %-100 %] 97 % (02/05 0731)    Intake/Output from previous day: 02/04 0701 - 02/05 0700 In: 300 [P.O.:240; IV Piggyback:60] Out: 1200 [Urine:1200] Intake/Output this shift: Total I/O In: -  Out: 800 [Urine:800]  General appearance: alert and no distress Resp: clear to auscultation bilaterally Cardio: regular rate and rhythm GI: soft, non-tender; bowel sounds normal; no masses,  no organomegaly Extremities: 4+ edema  Lab Results: Recent Labs    08/01/19 0627 08/02/19 1311 08/03/19 0317  WBC 3.9* 3.6* 3.6*  HGB 7.1* 8.5* 7.0*  HCT 21.6* 27.1* 20.9*  PLT 122* 159 122*   BMET Recent Labs    08/01/19 0627 08/02/19 1311 08/03/19 0317  NA 126* 127* 129*  K 4.2 3.9 4.2  CL 88* 89* 93*  CO2 27 26 24   GLUCOSE 114* 109* 104*  BUN 10 9 9   CREATININE 0.99 1.03* 1.08*  CALCIUM 8.5* 9.2 8.6*   LFT Recent Labs    08/03/19 0317  PROT 6.5  ALBUMIN 2.1*  AST 58*  ALT 27  ALKPHOS 77  BILITOT 4.1*   PT/INR No results for input(s): LABPROT, INR in the last 72 hours. Hepatitis Panel No results for input(s): HEPBSAG, HCVAB, HEPAIGM, HEPBIGM in the last 72 hours. C-Diff No results for input(s): CDIFFTOX in the last 72 hours. Fecal Lactopherrin No results for input(s): FECLLACTOFRN in the last 72 hours.  Studies/Results: DG Chest 2 View  Result Date: 08/02/2019 CLINICAL DATA:  Short of breath for several days, dry cough, mid chest pain EXAM: CHEST - 2 VIEW COMPARISON:  07/31/2019 FINDINGS: Frontal and lateral views of the chest demonstrate a stable cardiac silhouette. No acute airspace disease, effusion, or pneumothorax. The lungs are hyperinflated. Subsegmental atelectasis or  scarring within the right middle lobe. No acute bony abnormalities. IMPRESSION: 1. Stable exam, no acute process. Electronically Signed   By: Randa Ngo M.D.   On: 08/02/2019 13:09   CT Angio Chest PE W and/or Wo Contrast  Result Date: 08/02/2019 CLINICAL DATA:  Short of breath. Bilateral leg swelling. EXAM: CT ANGIOGRAPHY CHEST WITH CONTRAST TECHNIQUE: Multidetector CT imaging of the chest was performed using the standard protocol during bolus administration of intravenous contrast. Multiplanar CT image reconstructions and MIPs were obtained to evaluate the vascular anatomy. CONTRAST:  145mL OMNIPAQUE IOHEXOL 350 MG/ML SOLN COMPARISON:  None FINDINGS: Cardiovascular: Satisfactory opacification of the pulmonary arteries to the segmental level. No evidence of pulmonary embolism. Normal heart size. No pericardial effusion. Aortic atherosclerosis. Lad and RCA coronary artery calcifications. Mediastinum/Nodes: The Normal appearance of the thyroid gland. The trachea appears patent and is midline. Normal appearance of the esophagus. No supraclavicular, axillary, mediastinal or hilar adenopathy. Lungs/Pleura: Subsegmental atelectasis is identified within the right middle lobe. Cavitary lung mass within the posteromedial right lower lobe is identified measuring 3.4 x 3.4 cm, image 101/7. Small emphysematous bulla noted in the right apex. Upper Abdomen: Marked scratch set advanced changes of cirrhosis identified. Attenuation and enhancement of the liver is diffusely heterogeneous. Phase of contrast administration limits assessment for underlying liver lesion. Gallstones. Musculoskeletal: No chest wall abnormality. No acute or significant osseous findings. Review of the MIP images confirms the above findings. IMPRESSION: 1. No  evidence for acute pulmonary embolus. 2. Cavitary lung mass within the posteromedial right lower lobe is identified. Differential considerations include cavitary pneumonia versus cavitary  neoplasm. Consider one of the following in 3 months for both low-risk and high-risk individuals: (a) repeat chest CT, (b) follow-up PET-CT, or (c) referral to multi disciplinary thoracic group. 3. Advanced changes of cirrhosis. 4. Gallstones. 5. Multi vessel coronary artery calcifications. Aortic Atherosclerosis (ICD10-I70.0). Electronically Signed   By: Kerby Moors M.D.   On: 08/02/2019 18:33    Medications:  Scheduled: . pantoprazole  40 mg Oral Daily  . spironolactone  100 mg Oral Daily   Continuous: . albumin human Stopped (08/02/19 2149)    Assessment/Plan: 1) Severe lower extremity edema presumed to be from decompensated cirrhosis. 2) SOB - improved. 3) Hyponatremia. 4) Anemia.   The patient's weight dropped down by 3 lbs from 263 lbs down to 260 lbs.  She reported excellent diuresis with the IV lasix.  Again, she was on Step II diuretics as an outpatient and she was not responding.  Plan: 1) Await Renal assistance with diuretic management. 2) Continue to check daily weights. 3) Continue with 2 gram sodium diet.  LOS: 1 day   Wiatt Mahabir D 08/03/2019, 8:07 AM

## 2019-08-04 DIAGNOSIS — D649 Anemia, unspecified: Secondary | ICD-10-CM

## 2019-08-04 DIAGNOSIS — R609 Edema, unspecified: Secondary | ICD-10-CM

## 2019-08-04 LAB — TYPE AND SCREEN
ABO/RH(D): B POS
Antibody Screen: POSITIVE
Unit division: 0
Unit division: 0

## 2019-08-04 LAB — COMPREHENSIVE METABOLIC PANEL
ALT: 25 U/L (ref 0–44)
AST: 51 U/L — ABNORMAL HIGH (ref 15–41)
Albumin: 2.2 g/dL — ABNORMAL LOW (ref 3.5–5.0)
Alkaline Phosphatase: 80 U/L (ref 38–126)
Anion gap: 11 (ref 5–15)
BUN: 8 mg/dL (ref 6–20)
CO2: 28 mmol/L (ref 22–32)
Calcium: 8.3 mg/dL — ABNORMAL LOW (ref 8.9–10.3)
Chloride: 90 mmol/L — ABNORMAL LOW (ref 98–111)
Creatinine, Ser: 1.24 mg/dL — ABNORMAL HIGH (ref 0.44–1.00)
GFR calc Af Amer: 55 mL/min — ABNORMAL LOW (ref >60)
GFR calc non Af Amer: 48 mL/min — ABNORMAL LOW (ref >60)
Glucose, Bld: 99 mg/dL (ref 70–99)
Potassium: 3.4 mmol/L — ABNORMAL LOW (ref 3.5–5.1)
Sodium: 129 mmol/L — ABNORMAL LOW (ref 135–145)
Total Bilirubin: 4 mg/dL — ABNORMAL HIGH (ref 0.3–1.2)
Total Protein: 6.9 g/dL (ref 6.5–8.1)

## 2019-08-04 LAB — BPAM RBC
Blood Product Expiration Date: 202102282359
ISSUE DATE / TIME: 202102030140
Unit Type and Rh: 7300

## 2019-08-04 LAB — HEMOGLOBIN AND HEMATOCRIT, BLOOD
HCT: 21.5 % — ABNORMAL LOW (ref 36.0–46.0)
Hemoglobin: 7.1 g/dL — ABNORMAL LOW (ref 12.0–15.0)

## 2019-08-04 MED ORDER — FOSFOMYCIN TROMETHAMINE 3 G PO PACK
3.0000 g | PACK | Freq: Once | ORAL | Status: AC
  Administered 2019-08-04: 3 g via ORAL
  Filled 2019-08-04: qty 3

## 2019-08-04 MED ORDER — ZOLPIDEM TARTRATE 5 MG PO TABS
10.0000 mg | ORAL_TABLET | Freq: Every evening | ORAL | Status: DC | PRN
  Administered 2019-08-04 – 2019-08-07 (×4): 10 mg via ORAL
  Filled 2019-08-04 (×4): qty 2

## 2019-08-04 NOTE — Progress Notes (Signed)
Presents  Gastroenterology Inpatient Follow-up Note   PATIENT IDENTIFICATION  MACAYLA EKDAHL is a 59 y.o. female with a pmh significant for alcoholic cirrhosis, diabetes, hypertension, hyperlipidemia, sleep apnea who was admitted with lower extremity edema and anasarca being actively diuresed as inpatient. Hospital Day: 3  SUBJECTIVE  Patient remains on IV Lasix.  Electrolytes being monitored by primary team.  Patient feels better from shortness of breath perspective and feels lower extremities are doing slightly better.  No fevers or chills.  No worsening abdominal pain.  Based on the I's and O's in the chart, the patient is -3.6 L since admission.   OBJECTIVE  Scheduled Inpatient Medications:  . furosemide  40 mg Intravenous BID  . nicotine  21 mg Transdermal Daily  . pantoprazole  40 mg Oral Daily  . spironolactone  100 mg Oral Daily   Continuous Inpatient Infusions:  PRN Inpatient Medications: ipratropium-albuterol, oxyCODONE, zolpidem   Physical Examination  Temp:  [98.6 F (37 C)] 98.6 F (37 C) (02/06 1643) Pulse Rate:  [90-96] 90 (02/06 1643) Resp:  [19] 19 (02/06 1643) BP: (111-115)/(45-47) 111/45 (02/06 1643) SpO2:  [96 %-98 %] 98 % (02/06 1643) Temp (24hrs), Avg:98.6 F (37 C), Min:98.6 F (37 C), Max:98.6 F (37 C)    GEN: Appears older than stated age but nontoxic PSYCH: Cooperative EYE: Conjunctivae pink, sclerae anicteric ENT: MMM CV: Nontachycardic RESP: No wheezing present GI: NABS, rounded, obese, soft, NT/ND, without rebound or guarding MSK/EXT: 3+ lower extremity edema present SKIN: Spider angiomata present NEURO:  Alert & Oriented x 3, no focal deficits, no evidence of asterixis   Review of Data   Laboratory Studies   Recent Labs  Lab 07/31/19 1551 08/01/19 0627 08/04/19 0556  NA  --    < > 129*  K  --    < > 3.4*  CL  --    < > 90*  CO2  --    < > 28  BUN  --    < > 8  CREATININE  --    < > 1.24*  GLUCOSE  --    < > 99  CALCIUM  --     < > 8.3*  MG 1.8  --   --    < > = values in this interval not displayed.   Recent Labs  Lab 08/04/19 0556  AST 51*  ALT 25  ALKPHOS 80    Recent Labs  Lab 08/01/19 0627 08/01/19 0627 08/02/19 1311 08/02/19 1311 08/03/19 0317 08/03/19 1155 08/04/19 0556  WBC 3.9*   < > 3.6*   < > 3.6*  --   --   HGB 7.1*   < > 8.5*   < > 7.0*   < > 7.1*  HCT 21.6*   < > 27.1*   < > 20.9*   < > 21.5*  PLT 122*  --  159  --  122*  --   --    < > = values in this interval not displayed.   No results for input(s): APTT, INR in the last 168 hours. Computed MELD-Na score unavailable. Necessary lab results were not found in the last year. Computed MELD score unavailable. Necessary lab results were not found in the last year.  Imaging Studies  No new imaging studies to review  GI Procedures and Studies  No relevant studies to review   ASSESSMENT  Ms. Landstrom is a 59 y.o. female with a pmh significant for alcoholic cirrhosis, diabetes,  hypertension, hyperlipidemia, sleep apnea who was admitted with lower extremity edema and anasarca being actively diuresed as inpatient.  Patient seems to be doing better at this time.  Still has a long ways to go in regards to lower extremity edema but may benefit from nephrology or periodic IV albumin reasons to help optimize of the loop diuretic/spironolactone diuretic combination.  Query use on Sunday or Monday.  Will obtain iron indices and vitamin levels to evaluate for anemia.  Patient will be reevaluated tomorrow.   PLAN/RECOMMENDATIONS  Daily standing weight should be documented Continue diuretics as per medical service today and we will discuss based on weight tomorrow if any adjustments will be required Iron indices and vitamin levels to be sent for anemia work-up Patient may benefit from IV iron if found to be iron deficient Please ask nutrition to see and promote intake of 1.5 g/kg/day of Protein 1500-2000 mg Na diet 2000 mL Fluid restriction  may be beneficial to patient as well   Please page/call with questions or concerns.   Justice Britain, MD [facility] Office # [phone]    LOS: 2 days  Irving Copas  08/04/2019, 10:40 PM

## 2019-08-04 NOTE — Plan of Care (Signed)

## 2019-08-04 NOTE — Progress Notes (Signed)
PROGRESS NOTE    Stacey Monroe  WGY:[id number] DOB: [birth year] DOA: 08/02/2019 PCP: Aletha Halim., PA-C   Brief Narrative:  HPI On 08/02/2019 by Dr. Yaakov Guthrie Stacey Monroe is a 59 y.o. female with medical history significant of chronic alcoholic liver cirrhosis, chronic microcytic anemia, hyponatremia who presented to the emergency room today with complaints of worsening shortness of breath, bilateral lower extremity edema.  She was recently admitted at [facility] on 07/31/2019 with similar complaints.  She also had recent admission at [facility] and was seen by nephrology, GI service for volume overload, anemia.  She follows up with Dr. Almyra Free from GI.  She was admitted here in January 2021.  Per Dr. 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 These lesions were successfully ablated with APC as well as removal of multiple polyps from the colon" she was given 1 unit of PRBC at [facility] and was discharged yesterday.  However, patient says that she is continuing to have worsening shortness of breath and worsening bilateral lower extremity edema with orthopnea.  She denies having any chest pain at this time.  She denies having any nausea, vomiting, abdominal pain, diarrhea or dysuria at this time.  She denies any bright red blood in her stool.  She also had echocardiogram done January 2021 which per report was normal.   Interim history Admitted for shortness of breath and edema likely secondary to liver cirrhosis.  Gastroenterology consulted. Assessment & Plan   Dyspnea/edema, likely secondary to liver cirrhosis -Patient has had chronic shortness of breath, denies any chest pain -currently maintaining oxygen saturations in the high 90s on room air -Echocardiogram July 07, 2019 showed an EF of 60-65%. -Patient appears to be clinically volume overloaded -Chest x-ray unremarkable -Patient does have a  smoking history as well as COPD -D-dimer elevated, CTA chest obtained, negative for PE. -Discussed with nephrology, Dr. Johnney Ou, who recommended continuing IV Lasix 40 mg twice daily.  May consider torsemide 40 mg daily on discharge or possibly increasing Lasix to 60 mg twice daily. -Currently on lasix 40mg  IV BID -Continue to monitor intake and output, daily weights -UOP over past [id number]  Alcoholic liver cirrhosis -Gastroenterology consulted and appreciated -Continue spironolactone and IV Lasix  Hyponatremia -Appears to be chronic -Suspect secondary to liver cirrhosis and hypervolemia -Continue to monitor BMP  Chronic macrocytic Anemia -EGD in January 21 showed significant G AVE, AVMs in the proximal colon.  Successfully ablated with APC as well as removal of multiple polyps from the colon. -Patient recently received 1 unit PRBC at recent hospitalization a couple days ago at [facility] -Hemoglobin currently 7.1, continue to monitor CBC -If hemoglobin drops <7, will transfuse  UTI, ESBL E. Coli -Patient was noted to have UTI on recent admission, urine culture>100K E. coli, ESBL -given small bump in creatinine, will discontinue bactrim -discussed with pharmacy- will give dose of fosfomycin  Acute kidney injury -may be secondary to diuresis vs bactrim -will discontinue bactrim and continue diuresis as patient has volume overload -continue to monitor BMP  Nicotine abuse -Discussed smoking cessation -Nicotine patch ordered  DVT Prophylaxis  SCDs  Code Status: Full  Family Communication: None at bedside  Disposition Plan: Admitted from home for SOB secondary to volume overload.  GI consulted, currently on IV diuresis.  Disposition to be determined- likely home in 2-3 days.  Consultants Gastroenterology Nephrology via phone  Procedures  None  Antibiotics   Anti-infectives (From admission, onward)   Start     Dose/Rate Route Frequency Ordered Stop    08/03/19 1200  sulfamethoxazole-trimethoprim (BACTRIM) 400-80 MG per tablet 2 tablet     2 tablet Oral Every 12 hours 08/03/19 1132 08/06/19 0959      Subjective:   Stacey Monroe seen and examined today.  Feels shortness has breath improved but still evident with minimal exertion. Feels leg swelling is improving. Denies current chest pain, abdominal pain, N/V/D/C. Complains of frequently urinating and feels her purwick is not positioned correctly.    Objective:   Vitals:   08/03/19 0731 08/03/19 1610 08/03/19 2102 08/04/19 0756  BP: (!) 114/35 (!) 117/48 94/64 (!) 115/47  Pulse: 87 88 91 96  Resp: 20 18  19   Temp: 98.2 F (36.8 C) 98.6 F (37 C) 98.6 F (37 C) 98.6 F (37 C)  TempSrc:    Oral  SpO2: 97% 99% 98% 96%    Intake/Output Summary (Last 24 hours) at 08/04/2019 8916 Last data filed at 08/04/2019 0224 Gross per 24 hour  Intake --  Output 325 ml  Net -325 ml   There were no vitals filed for this visit.  Exam  General: Well developed, well nourished, NAD, appears stated age  82: NCAT,mucous membranes moist.   Cardiovascular: S1 S2 auscultated, RRR  Respiratory: diminished breath sounds, however clear  Abdomen: Soft, nontender, nondistended, + bowel sounds  Extremities: warm dry without cyanosis clubbing. +++LE edema B/L   Neuro: AAOx3, nonfocal  Psych: Appropriate mood and affect  Data Reviewed: I have personally reviewed following labs and imaging studies  CBC: Recent Labs  Lab 07/31/19 1500 07/31/19 1500 08/01/19 0627 08/02/19 1311 08/03/19 0317 08/03/19 1155 08/04/19 0556  WBC 4.4  --  3.9* 3.6* 3.6*  --   --   NEUTROABS 2.4  --   --   --   --   --   --   HGB 7.0*   < > 7.1* 8.5* 7.0* 7.2* 7.1*  HCT 21.6*   < > 21.6* 27.1* 20.9* 22.0* 21.5*  MCV 113.7*  --  108.0* 111.1* 106.6*  --   --   PLT 138*  --  122* 159 122*  --   --    < > = values in this interval not displayed.   Basic Metabolic Panel: Recent Labs  Lab 07/31/19 1500  07/31/19 1551 08/01/19 0627 08/02/19 1311 08/03/19 0317 08/04/19 0556  NA 125*  --  126* 127* 129* 129*  K 2.7*  --  4.2 3.9 4.2 3.4*  CL 86*  --  88* 89* 93* 90*  CO2 27  --  27 26 24 28   GLUCOSE 112*  --  114* 109* 104* 99  BUN 9  --  10 9 9 8   CREATININE 0.86  --  0.99 1.03* 1.08* 1.24*  CALCIUM 8.7*  --  8.5* 9.2 8.6* 8.3*  MG  --  1.8  --   --   --   --    GFR: Estimated Creatinine Clearance: 66.5 mL/min (A) (by C-G formula based on SCr of 1.24 mg/dL (H)). Liver Function Tests: Recent Labs  Lab 07/31/19 1500 08/01/19 0627 08/02/19 1311 08/03/19 0317 08/04/19 0556  AST 77* 71* 69* 58* 51*  ALT 37 33 32 27 25  ALKPHOS 121 111 116 77 80  BILITOT 3.6* 4.2* 6.4* 4.1* 4.0*  PROT 7.2 6.7 8.2* 6.5 6.9  ALBUMIN 2.4* 2.4* 2.6* 2.1* 2.2*  Recent Labs  Lab 07/31/19 1500  LIPASE 63*   Recent Labs  Lab 07/31/19 1551  AMMONIA 47*   Coagulation Profile: No results for input(s): INR, PROTIME in the last 168 hours. Cardiac Enzymes: No results for input(s): CKTOTAL, CKMB, CKMBINDEX, TROPONINI in the last 168 hours. BNP (last 3 results) No results for input(s): PROBNP in the last 8760 hours. HbA1C: No results for input(s): HGBA1C in the last 72 hours. CBG: No results for input(s): GLUCAP in the last 168 hours. Lipid Profile: No results for input(s): CHOL, HDL, LDLCALC, TRIG, CHOLHDL, LDLDIRECT in the last 72 hours. Thyroid Function Tests: No results for input(s): TSH, T4TOTAL, FREET4, T3FREE, THYROIDAB in the last 72 hours. Anemia Panel: No results for input(s): VITAMINB12, FOLATE, FERRITIN, TIBC, IRON, RETICCTPCT in the last 72 hours. Urine analysis:    Component Value Date/Time   COLORURINE YELLOW 07/31/2019 1436   APPEARANCEUR CLEAR 07/31/2019 1436   LABSPEC 1.015 07/31/2019 1436   PHURINE 5.0 07/31/2019 1436   GLUCOSEU NEGATIVE 07/31/2019 1436   HGBUR NEGATIVE 07/31/2019 1436   BILIRUBINUR NEGATIVE 07/31/2019 1436   KETONESUR NEGATIVE 07/31/2019 1436    PROTEINUR NEGATIVE 07/31/2019 1436   UROBILINOGEN 2.0 (H) 10/29/2009 2340   NITRITE POSITIVE (A) 07/31/2019 1436   LEUKOCYTESUR MODERATE (A) 07/31/2019 1436   Sepsis Labs: @LABRCNTIP (procalcitonin:4,lacticidven:4)  ) Recent Results (from the past 240 hour(s))  SARS CORONAVIRUS 2 (TAT 6-24 HRS) Nasopharyngeal Urine, Clean Catch     Status: None   Collection Time: 07/31/19  3:53 PM   Specimen: Urine, Clean Catch; Nasopharyngeal  Result Value Ref Range Status   SARS Coronavirus 2 NEGATIVE NEGATIVE Final    Comment: (NOTE) SARS-CoV-2 target nucleic acids are NOT DETECTED. The SARS-CoV-2 RNA is generally detectable in upper and lower respiratory specimens during the acute phase of infection. Negative results do not preclude SARS-CoV-2 infection, do not rule out co-infections with other pathogens, and should not be used as the sole basis for treatment or other patient management decisions. Negative results must be combined with clinical observations, patient history, and epidemiological information. The expected result is Negative. Fact Sheet for Patients: SugarRoll.be Fact Sheet for Healthcare Providers: https://www.woods-mathews.com/ This test is not yet approved or cleared by the [place] FDA and  has been authorized for detection and/or diagnosis of SARS-CoV-2 by FDA under an Emergency Use Authorization (EUA). This EUA will remain  in effect (meaning this test can be used) for the duration of the COVID-19 declaration under Section 56 4(b)(1) of the Act, 21 U.S.C. section 360bbb-3(b)(1), unless the authorization is terminated or revoked sooner. Performed at [facility], [place] [address], [place], [place] [address]   Culture, Urine     Status: Abnormal   Collection Time: 07/31/19  9:26 PM   Specimen: Urine, Clean Catch  Result Value Ref Range Status   Specimen Description   Final    URINE, CLEAN CATCH Performed at [facility], [address] [place], [place] [address]    Special Requests   Final    NONE Performed at [facility], [address], [place],  [address]    Culture (A)  Final    >=100,000 COLONIES/mL ESCHERICHIA COLI Confirmed Extended Spectrum Beta-Lactamase Producer (ESBL).  In bloodstream infections from ESBL organisms, carbapenems are preferred over piperacillin/tazobactam. They are shown to have a lower risk of mortality.    Report Status 08/03/2019 FINAL  Final   Organism ID, Bacteria ESCHERICHIA COLI (A)  Final      Susceptibility  Escherichia coli - MIC*    AMPICILLIN >=32 RESISTANT Resistant     CEFAZOLIN >=64 RESISTANT Resistant     CEFTRIAXONE >=64 RESISTANT Resistant     CIPROFLOXACIN 1 SENSITIVE Sensitive     GENTAMICIN <=1 SENSITIVE Sensitive     IMIPENEM <=0.25 SENSITIVE Sensitive     NITROFURANTOIN <=16 SENSITIVE Sensitive     TRIMETH/SULFA <=20 SENSITIVE Sensitive     AMPICILLIN/SULBACTAM 8 SENSITIVE Sensitive     PIP/TAZO <=4 SENSITIVE Sensitive     * >=100,000 COLONIES/mL ESCHERICHIA COLI  SARS CORONAVIRUS 2 (TAT 6-24 HRS) Nasopharyngeal Nasopharyngeal Swab     Status: None   Collection Time: 08/02/19  3:22 PM   Specimen: Nasopharyngeal Swab  Result Value Ref Range Status   SARS Coronavirus 2 NEGATIVE NEGATIVE Final    Comment: (NOTE) SARS-CoV-2 target nucleic acids are NOT DETECTED. The SARS-CoV-2 RNA is generally detectable in upper and lower respiratory specimens during the acute phase of infection. Negative results do not preclude SARS-CoV-2 infection, do not rule out co-infections with other pathogens, and should not be used as the sole basis for treatment or other patient management decisions. Negative results must be combined with clinical observations, patient history, and epidemiological information. The expected result is Negative. Fact Sheet for Patients: SugarRoll.be Fact Sheet for Healthcare  Providers: https://www.woods-mathews.com/ This test is not yet approved or cleared by the [place] FDA and  has been authorized for detection and/or diagnosis of SARS-CoV-2 by FDA under an Emergency Use Authorization (EUA). This EUA will remain  in effect (meaning this test can be used) for the duration of the COVID-19 declaration under Section 56 4(b)(1) of the Act, 21 U.S.C. section 360bbb-3(b)(1), unless the authorization is terminated or revoked sooner. Performed at [facility], [place] [address], [place], [place] [address]       Radiology Studies: DG Chest 2 View  Result Date: 08/02/2019 CLINICAL DATA:  Short of breath for several days, dry cough, mid chest pain EXAM: CHEST - 2 VIEW COMPARISON:  07/31/2019 FINDINGS: Frontal and lateral views of the chest demonstrate a stable cardiac silhouette. No acute airspace disease, effusion, or pneumothorax. The lungs are hyperinflated. Subsegmental atelectasis or scarring within the right middle lobe. No acute bony abnormalities. IMPRESSION: 1. Stable exam, no acute process. Electronically Signed   By: Randa Ngo M.D.   On: 08/02/2019 13:09   CT Angio Chest PE W and/or Wo Contrast  Result Date: 08/02/2019 CLINICAL DATA:  Short of breath. Bilateral leg swelling. EXAM: CT ANGIOGRAPHY CHEST WITH CONTRAST TECHNIQUE: Multidetector CT imaging of the chest was performed using the standard protocol during bolus administration of intravenous contrast. Multiplanar CT image reconstructions and MIPs were obtained to evaluate the vascular anatomy. CONTRAST:  143mL OMNIPAQUE IOHEXOL 350 MG/ML SOLN COMPARISON:  None FINDINGS: Cardiovascular: Satisfactory opacification of the pulmonary arteries to the segmental level. No evidence of pulmonary embolism. Normal heart size. No pericardial effusion. Aortic atherosclerosis. Lad and RCA coronary artery calcifications. Mediastinum/Nodes: The Normal appearance of the thyroid gland. The trachea appears  patent and is midline. Normal appearance of the esophagus. No supraclavicular, axillary, mediastinal or hilar adenopathy. Lungs/Pleura: Subsegmental atelectasis is identified within the right middle lobe. Cavitary lung mass within the posteromedial right lower lobe is identified measuring 3.4 x 3.4 cm, image 101/7. Small emphysematous bulla noted in the right apex. Upper Abdomen: Marked scratch set advanced changes of cirrhosis identified. Attenuation and enhancement of the liver is diffusely heterogeneous. Phase of contrast administration limits assessment for underlying  liver lesion. Gallstones. Musculoskeletal: No chest wall abnormality. No acute or significant osseous findings. Review of the MIP images confirms the above findings. IMPRESSION: 1. No evidence for acute pulmonary embolus. 2. Cavitary lung mass within the posteromedial right lower lobe is identified. Differential considerations include cavitary pneumonia versus cavitary neoplasm. Consider one of the following in 3 months for both low-risk and high-risk individuals: (a) repeat chest CT, (b) follow-up PET-CT, or (c) referral to multi disciplinary thoracic group. 3. Advanced changes of cirrhosis. 4. Gallstones. 5. Multi vessel coronary artery calcifications. Aortic Atherosclerosis (ICD10-I70.0). Electronically Signed   By: Kerby Moors M.D.   On: 08/02/2019 18:33     Scheduled Meds:  furosemide  40 mg Intravenous BID   nicotine  21 mg Transdermal Daily   pantoprazole  40 mg Oral Daily   spironolactone  100 mg Oral Daily   sulfamethoxazole-trimethoprim  2 tablet Oral Q12H   Continuous Infusions:   LOS: 2 days   Time Spent in minutes   45 minutes  Philbert Ocallaghan D.O. on 08/04/2019 at 9:06 AM  Between 7am to 7pm - Please see pager noted on [email or web address]  After 7pm go to [email or web address]  And look for the night coverage person covering for me after hours  [facility] Office  [phone]

## 2019-08-05 DIAGNOSIS — R601 Generalized edema: Secondary | ICD-10-CM

## 2019-08-05 LAB — HEMOGLOBIN AND HEMATOCRIT, BLOOD
HCT: 24.3 % — ABNORMAL LOW (ref 36.0–46.0)
Hemoglobin: 7.8 g/dL — ABNORMAL LOW (ref 12.0–15.0)

## 2019-08-05 LAB — BASIC METABOLIC PANEL
Anion gap: 11 (ref 5–15)
BUN: 6 mg/dL (ref 6–20)
CO2: 27 mmol/L (ref 22–32)
Calcium: 8.7 mg/dL — ABNORMAL LOW (ref 8.9–10.3)
Chloride: 92 mmol/L — ABNORMAL LOW (ref 98–111)
Creatinine, Ser: 1.19 mg/dL — ABNORMAL HIGH (ref 0.44–1.00)
GFR calc Af Amer: 58 mL/min — ABNORMAL LOW (ref >60)
GFR calc non Af Amer: 50 mL/min — ABNORMAL LOW (ref >60)
Glucose, Bld: 103 mg/dL — ABNORMAL HIGH (ref 70–99)
Potassium: 3.4 mmol/L — ABNORMAL LOW (ref 3.5–5.1)
Sodium: 130 mmol/L — ABNORMAL LOW (ref 135–145)

## 2019-08-05 LAB — FOLATE: Folate: 4.3 ng/mL — ABNORMAL LOW (ref >5.9)

## 2019-08-05 LAB — VITAMIN B12: Vitamin B-12: 1291 pg/mL — ABNORMAL HIGH (ref 180–914)

## 2019-08-05 LAB — IRON AND TIBC
Iron: 122 ug/dL (ref 28–170)
Saturation Ratios: 59 % — ABNORMAL HIGH (ref 10.4–31.8)
TIBC: 207 ug/dL — ABNORMAL LOW (ref 250–450)
UIBC: 85 ug/dL

## 2019-08-05 LAB — FERRITIN: Ferritin: 183 ng/mL (ref 11–307)

## 2019-08-05 MED ORDER — POTASSIUM CHLORIDE CRYS ER 20 MEQ PO TBCR
40.0000 meq | EXTENDED_RELEASE_TABLET | Freq: Once | ORAL | Status: AC
  Administered 2019-08-05: 40 meq via ORAL
  Filled 2019-08-05: qty 2

## 2019-08-05 NOTE — Progress Notes (Signed)
Presents  Gastroenterology Inpatient Follow-up Note   PATIENT IDENTIFICATION  Stacey Monroe is a 59 y.o. female with a pmh significant for alcoholic cirrhosis, diabetes, hypertension, hyperlipidemia, sleep apnea who was admitted with lower extremity edema and anasarca being actively diuresed as inpatient. Hospital Day: 4  SUBJECTIVE  Patient continues on IV Lasix.  Cr is stable.  No significant complaints other than being tired.  She was -1700 yesterday and weight is documented in the chart.   OBJECTIVE  Scheduled Inpatient Medications:  . furosemide  40 mg Intravenous BID  . nicotine  21 mg Transdermal Daily  . pantoprazole  40 mg Oral Daily  . spironolactone  100 mg Oral Daily   Continuous Inpatient Infusions:  PRN Inpatient Medications: ipratropium-albuterol, oxyCODONE, zolpidem   Physical Examination  Temp:  [98.1 F (36.7 C)-98.2 F (36.8 C)] 98.1 F (36.7 C) (02/07 0748) Pulse Rate:  [90-91] 90 (02/07 0748) Resp:  [19] 19 (02/07 0748) BP: (115-116)/(46-51) 115/51 (02/07 0748) SpO2:  [96 %-99 %] 99 % (02/07 0748) Weight:  [115.6 kg] 115.6 kg (02/07 0526) Temp (24hrs), Avg:98.2 F (36.8 C), Min:98.1 F (36.7 C), Max:98.2 F (36.8 C)  Weight: 115.6 kg GEN: Appears older than stated age but nontoxic PSYCH: Cooperative EYE: Conjunctivae pink, sclerae anicteric CV: Nontachycardic RESP: No wheezing present GI: NABS, rounded, obese, soft, NT/ND, without rebound or guarding MSK/EXT: 3+ lower extremity edema present SKIN: Spider angiomata present NEURO:  Alert & Oriented x 3, no focal deficits, no evidence of asterixis   Review of Data   Laboratory Studies   Recent Labs  Lab 07/31/19 1551 08/01/19 0627 08/05/19 0601  NA  --    < > 130*  K  --    < > 3.4*  CL  --    < > 92*  CO2  --    < > 27  BUN  --    < > 6  CREATININE  --    < > 1.19*  GLUCOSE  --    < > 103*  CALCIUM  --    < > 8.7*  MG 1.8  --   --    < > = values in this interval not displayed.    Recent Labs  Lab 08/04/19 0556  AST 51*  ALT 25  ALKPHOS 80    Recent Labs  Lab 08/01/19 0627 08/01/19 0627 08/02/19 1311 08/02/19 1311 08/03/19 0317 08/03/19 1155 08/05/19 0601  WBC 3.9*   < > 3.6*   < > 3.6*  --   --   HGB 7.1*   < > 8.5*   < > 7.0*   < > 7.8*  HCT 21.6*   < > 27.1*   < > 20.9*   < > 24.3*  PLT 122*  --  159  --  122*  --   --    < > = values in this interval not displayed.   No results for input(s): APTT, INR in the last 168 hours. Computed MELD-Na score unavailable. Necessary lab results were not found in the last year. Computed MELD score unavailable. Necessary lab results were not found in the last year.  Imaging Studies  No new imaging studies to review  GI Procedures and Studies  No relevant studies to review   ASSESSMENT  Stacey Monroe is a 59 y.o. female with a pmh significant for alcoholic cirrhosis, diabetes, hypertension, hyperlipidemia, sleep apnea who was admitted with lower extremity edema and anasarca being actively diuresed as  inpatient.  Patient still doing OK.  Still have room for diuresis.  Consider IV Albumin tomorrow with Lasix for either AM dose or PM dose.  Will follow up anemia labs.  Patient will be reevaluated tomorrow by Dr. Benson Norway.   PLAN/RECOMMENDATIONS  Continue daily weights + I/Os Continue diuretics as per medical service today and we will discuss based on weight tomorrow if any adjustments will be required F/U Anemia workup Promote intake of 1.5 g/kg/day of Protein 1500-2000 mg Na diet 2000 mL Fluid restriction may be beneficial to patient as well   Please page/call with questions or concerns.   Stacey Britain, MD [facility] Office # [phone]    LOS: 3 days  Stacey Monroe  08/05/2019, 4:46 PM

## 2019-08-05 NOTE — Progress Notes (Signed)
PROGRESS NOTE    [name]  ZJI:[id number] DOB: May 16, 1961 DOA: 08/02/2019 PCP: Aletha Halim., PA-C   Brief Narrative:  HPI On 08/02/2019 by Dr. Yaakov Guthrie Stacey Monroe is a 59 y.o. female with medical history significant of chronic alcoholic liver cirrhosis, chronic microcytic anemia, hyponatremia who presented to the emergency room today with complaints of worsening shortness of breath, bilateral lower extremity edema.  She was recently admitted at [facility] on 07/31/2019 with similar complaints.  She also had recent admission at [facility] and was seen by nephrology, GI service for volume overload, anemia.  She follows up with Dr. Almyra Free from GI.  She was admitted here in January 2021.  Per Dr. 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 These lesions were successfully ablated with APC as well as removal of multiple polyps from the colon" she was given 1 unit of PRBC at [facility] and was discharged yesterday.  However, patient says that she is continuing to have worsening shortness of breath and worsening bilateral lower extremity edema with orthopnea.  She denies having any chest pain at this time.  She denies having any nausea, vomiting, abdominal pain, diarrhea or dysuria at this time.  She denies any bright red blood in her stool.  She also had echocardiogram done January 2021 which per report was normal.   Interim history Admitted for shortness of breath and edema likely secondary to liver cirrhosis.  Gastroenterology consulted. Assessment & Plan   Dyspnea/edema, likely secondary to liver cirrhosis -Patient has had chronic shortness of breath, denies any chest pain -currently maintaining oxygen saturations in the high 90s on room air -Echocardiogram July 07, 2019 showed an EF of 60-65%. -Patient appears to be clinically volume overloaded -Chest x-ray unremarkable -Patient does have a  smoking history as well as COPD -D-dimer elevated, CTA chest obtained, negative for PE. -Discussed with nephrology, Dr. Johnney Ou, who recommended continuing IV Lasix 40 mg twice daily.  May consider torsemide 40 mg daily on discharge or possibly increasing Lasix to 60 mg twice daily. -Currently on lasix 40mg  IV BID -Continue to monitor intake and output, daily weights -UOP over past [id number]  Alcoholic liver cirrhosis -Gastroenterology consulted and appreciated -Continue spironolactone and IV Lasix  Hyponatremia -Appears to be chronic -Suspect secondary to liver cirrhosis and hypervolemia -Continue to monitor BMP  Chronic macrocytic Anemia -EGD in January 21 showed significant G AVE, AVMs in the proximal colon.  Successfully ablated with APC as well as removal of multiple polyps from the colon. -Patient recently received 1 unit PRBC at recent hospitalization a couple days ago at [facility] -Hemoglobin currently 7.8, continue to monitor CBC -If hemoglobin drops <7, will transfuse  UTI, ESBL E. Coli -Patient was noted to have UTI on recent admission, urine culture>100K E. coli, ESBL -given small bump in creatinine, will discontinue bactrim -discussed with pharmacy- will give dose of fosfomycin  Acute kidney injury -may be secondary to diuresis vs bactrim -creatinine improved to 1.19 from 1.24 -will discontinue bactrim and continue diuresis as patient has volume overload -continue to monitor BMP  Nicotine abuse -Discussed smoking cessation -Nicotine patch ordered  DVT Prophylaxis  SCDs  Code Status: Full  Family Communication: None at bedside  Disposition Plan: Admitted from home for SOB secondary to volume overload.  GI consulted, currently on IV diuresis.  Disposition to be determined- likely home in 2-3 days.  Consultants  Gastroenterology Nephrology via phone  Procedures  None  Antibiotics   Anti-infectives (From admission, onward)   Start      Dose/Rate Route Frequency Ordered Stop   08/04/19 1000  fosfomycin (MONUROL) packet 3 g     3 g Oral  Once 08/04/19 0912 08/04/19 1153   08/03/19 1200  sulfamethoxazole-trimethoprim (BACTRIM) 400-80 MG per tablet 2 tablet  Status:  Discontinued     2 tablet Oral Every 12 hours 08/03/19 1132 08/04/19 0912      Subjective:   Stacey Monroe seen and examined today.  Patient feels her shortness of breath has improved and her lower extremity swelling is getting better slowly.  Feels that she is extremely tired and would like to sleep more today.  Denies chest pain, abdominal pain, nausea vomiting, diarrhea or constipation, dizziness or headache.  Objective:   Vitals:   08/04/19 1643 08/04/19 2301 08/05/19 0526 08/05/19 0748  BP: (!) 111/45 (!) 116/46  (!) 115/51  Pulse: 90 91  90  Resp: 19   19  Temp: 98.6 F (37 C) 98.2 F (36.8 C)  98.1 F (36.7 C)  TempSrc: Oral Oral    SpO2: 98% 96%  99%  Weight:   115.6 kg     Intake/Output Summary (Last 24 hours) at 08/05/2019 0102 Last data filed at 08/04/2019 2100 Gross per 24 hour  Intake --  Output 1650 ml  Net -1650 ml   Filed Weights   08/05/19 0526  Weight: 115.6 kg   Exam  General: Well developed, well nourished, NAD, appears stated age  HEENT: NCAT, mucous membranes moist.   Cardiovascular: S1 S2 auscultated, RRR  Respiratory: Clear to auscultation bilaterally  Abdomen: Soft, nontender, nondistended, + bowel sounds  Extremities: warm dry without cyanosis clubbing. ++LE edema B/L, mildly improved  Neuro: AAOx3, nonfocal  Psych: Appropriate mood and affect  Data Reviewed: I have personally reviewed following labs and imaging studies  CBC: Recent Labs  Lab 07/31/19 1500 07/31/19 1500 08/01/19 0627 08/01/19 0627 08/02/19 1311 08/03/19 0317 08/03/19 1155 08/04/19 0556 08/05/19 0601  WBC 4.4  --  3.9*  --  3.6* 3.6*  --   --   --   NEUTROABS 2.4  --   --   --   --   --   --   --   --   HGB 7.0*   < > 7.1*   < >  8.5* 7.0* 7.2* 7.1* 7.8*  HCT 21.6*   < > 21.6*   < > 27.1* 20.9* 22.0* 21.5* 24.3*  MCV 113.7*  --  108.0*  --  111.1* 106.6*  --   --   --   PLT 138*  --  122*  --  159 122*  --   --   --    < > = values in this interval not displayed.   Basic Metabolic Panel: Recent Labs  Lab 07/31/19 1500 07/31/19 1551 08/01/19 0627 08/02/19 1311 08/03/19 0317 08/04/19 0556 08/05/19 0601  NA   < >  --  126* 127* 129* 129* 130*  K   < >  --  4.2 3.9 4.2 3.4* 3.4*  CL   < >  --  88* 89* 93* 90* 92*  CO2   < >  --  27 26 24 28 27   GLUCOSE   < >  --  114* 109* 104* 99 103*  BUN   < >  --  10 9 9 8 6   CREATININE   < >  --  0.99 1.03* 1.08* 1.24* 1.19*  CALCIUM   < >  --  8.5* 9.2 8.6* 8.3* 8.7*  MG  --  1.8  --   --   --   --   --    < > = values in this interval not displayed.   GFR: Estimated Creatinine Clearance: 66.9 mL/min (A) (by C-G formula based on SCr of 1.19 mg/dL (H)). Liver Function Tests: Recent Labs  Lab 07/31/19 1500 08/01/19 0627 08/02/19 1311 08/03/19 0317 08/04/19 0556  AST 77* 71* 69* 58* 51*  ALT 37 33 32 27 25  ALKPHOS 121 111 116 77 80  BILITOT 3.6* 4.2* 6.4* 4.1* 4.0*  PROT 7.2 6.7 8.2* 6.5 6.9  ALBUMIN 2.4* 2.4* 2.6* 2.1* 2.2*   Recent Labs  Lab 07/31/19 1500  LIPASE 63*   Recent Labs  Lab 07/31/19 1551  AMMONIA 47*   Coagulation Profile: No results for input(s): INR, PROTIME in the last 168 hours. Cardiac Enzymes: No results for input(s): CKTOTAL, CKMB, CKMBINDEX, TROPONINI in the last 168 hours. BNP (last 3 results) No results for input(s): PROBNP in the last 8760 hours. HbA1C: No results for input(s): HGBA1C in the last 72 hours. CBG: No results for input(s): GLUCAP in the last 168 hours. Lipid Profile: No results for input(s): CHOL, HDL, LDLCALC, TRIG, CHOLHDL, LDLDIRECT in the last 72 hours. Thyroid Function Tests: No results for input(s): TSH, T4TOTAL, FREET4, T3FREE, THYROIDAB in the last 72 hours. Anemia Panel: Recent Labs     08/05/19 0601  VITAMINB12 1,291*  FOLATE 4.3*  FERRITIN 183  TIBC 207*  IRON 122   Urine analysis:    Component Value Date/Time   COLORURINE YELLOW 07/31/2019 1436   APPEARANCEUR CLEAR 07/31/2019 1436   LABSPEC 1.015 07/31/2019 1436   PHURINE 5.0 07/31/2019 1436   GLUCOSEU NEGATIVE 07/31/2019 1436   HGBUR NEGATIVE 07/31/2019 1436   BILIRUBINUR NEGATIVE 07/31/2019 1436   KETONESUR NEGATIVE 07/31/2019 1436   PROTEINUR NEGATIVE 07/31/2019 1436   UROBILINOGEN 2.0 (H) 10/29/2009 2340   NITRITE POSITIVE (A) 07/31/2019 1436   LEUKOCYTESUR MODERATE (A) 07/31/2019 1436   Sepsis Labs: @LABRCNTIP (procalcitonin:4,lacticidven:4)  ) Recent Results (from the past 240 hour(s))  SARS CORONAVIRUS 2 (TAT 6-24 HRS) Nasopharyngeal Urine, Clean Catch     Status: None   Collection Time: 07/31/19  3:53 PM   Specimen: Urine, Clean Catch; Nasopharyngeal  Result Value Ref Range Status   SARS Coronavirus 2 NEGATIVE NEGATIVE Final    Comment: (NOTE) SARS-CoV-2 target nucleic acids are NOT DETECTED. The SARS-CoV-2 RNA is generally detectable in upper and lower respiratory specimens during the acute phase of infection. Negative results do not preclude SARS-CoV-2 infection, do not rule out co-infections with other pathogens, and should not be used as the sole basis for treatment or other patient management decisions. Negative results must be combined with clinical observations, patient history, and epidemiological information. The expected result is Negative. Fact Sheet for Patients: SugarRoll.be Fact Sheet for Healthcare Providers: https://www.woods-mathews.com/ This test is not yet approved or cleared by the [place] FDA and  has been authorized for detection and/or diagnosis of SARS-CoV-2 by FDA under an Emergency Use Authorization (EUA). This EUA will remain  in effect (meaning this test can be used) for the duration of the COVID-19 declaration  under Section 56 4(b)(1) of the Act, 21 U.S.C. section 360bbb-3(b)(1), unless the authorization is terminated or revoked sooner. Performed at [facility], [place] [address], [place], [place] [address]   Culture, Urine  Status: Abnormal   Collection Time: 07/31/19  9:26 PM   Specimen: Urine, Clean Catch  Result Value Ref Range Status   Specimen Description   Final    URINE, CLEAN CATCH Performed at [facility], [address] [place], [place] [address]    Special Requests   Final    NONE Performed at [facility], [address] [place], [place] [address]    Culture (A)  Final    >=100,000 COLONIES/mL ESCHERICHIA COLI Confirmed Extended Spectrum Beta-Lactamase Producer (ESBL).  In bloodstream infections from ESBL organisms, carbapenems are preferred over piperacillin/tazobactam. They are shown to have a lower risk of mortality.    Report Status 08/03/2019 FINAL  Final   Organism ID, Bacteria ESCHERICHIA COLI (A)  Final      Susceptibility   Escherichia coli - MIC*    AMPICILLIN >=32 RESISTANT Resistant     CEFAZOLIN >=64 RESISTANT Resistant     CEFTRIAXONE >=64 RESISTANT Resistant     CIPROFLOXACIN 1 SENSITIVE Sensitive     GENTAMICIN <=1 SENSITIVE Sensitive     IMIPENEM <=0.25 SENSITIVE Sensitive     NITROFURANTOIN <=16 SENSITIVE Sensitive     TRIMETH/SULFA <=20 SENSITIVE Sensitive     AMPICILLIN/SULBACTAM 8 SENSITIVE Sensitive     PIP/TAZO <=4 SENSITIVE Sensitive     * >=100,000 COLONIES/mL ESCHERICHIA COLI  SARS CORONAVIRUS 2 (TAT 6-24 HRS) Nasopharyngeal Nasopharyngeal Swab     Status: None   Collection Time: 08/02/19  3:22 PM   Specimen: Nasopharyngeal Swab  Result Value Ref Range Status   SARS Coronavirus 2 NEGATIVE NEGATIVE Final    Comment: (NOTE) SARS-CoV-2 target nucleic acids are NOT DETECTED. The SARS-CoV-2 RNA is generally detectable in upper and lower respiratory specimens during the acute phase of infection. Negative results do not preclude  SARS-CoV-2 infection, do not rule out co-infections with other pathogens, and should not be used as the sole basis for treatment or other patient management decisions. Negative results must be combined with clinical observations, patient history, and epidemiological information. The expected result is Negative. Fact Sheet for Patients: SugarRoll.be Fact Sheet for Healthcare Providers: https://www.woods-mathews.com/ This test is not yet approved or cleared by the [place] FDA and  has been authorized for detection and/or diagnosis of SARS-CoV-2 by FDA under an Emergency Use Authorization (EUA). This EUA will remain  in effect (meaning this test can be used) for the duration of the COVID-19 declaration under Section 56 4(b)(1) of the Act, 21 U.S.C. section 360bbb-3(b)(1), unless the authorization is terminated or revoked sooner. Performed at [facility], [place] [address], [place], [place] [address]       Radiology Studies: No results found.   Scheduled Meds: . furosemide  40 mg Intravenous BID  . nicotine  21 mg Transdermal Daily  . pantoprazole  40 mg Oral Daily  . spironolactone  100 mg Oral Daily   Continuous Infusions:   LOS: 3 days   Time Spent in minutes   30 minutes  Bria Portales D.O. on 08/05/2019 at 9:53 AM  Between 7am to 7pm - Please see pager noted on [email or web address]  After 7pm go to [email or web address]  And look for the night coverage person covering for me after hours  [facility] Office  [phone]

## 2019-08-06 LAB — HEMOGLOBIN AND HEMATOCRIT, BLOOD
HCT: 21.2 % — ABNORMAL LOW (ref 36.0–46.0)
Hemoglobin: 7 g/dL — ABNORMAL LOW (ref 12.0–15.0)

## 2019-08-06 LAB — BASIC METABOLIC PANEL WITH GFR
Anion gap: 11 (ref 5–15)
BUN: 6 mg/dL (ref 6–20)
CO2: 28 mmol/L (ref 22–32)
Calcium: 8.7 mg/dL — ABNORMAL LOW (ref 8.9–10.3)
Chloride: 90 mmol/L — ABNORMAL LOW (ref 98–111)
Creatinine, Ser: 1.1 mg/dL — ABNORMAL HIGH (ref 0.44–1.00)
GFR calc Af Amer: >60 mL/min
GFR calc non Af Amer: 55 mL/min — ABNORMAL LOW
Glucose, Bld: 103 mg/dL — ABNORMAL HIGH (ref 70–99)
Potassium: 3.7 mmol/L (ref 3.5–5.1)
Sodium: 129 mmol/L — ABNORMAL LOW (ref 135–145)

## 2019-08-06 MED ORDER — ENSURE ENLIVE PO LIQD
237.0000 mL | ORAL | Status: DC
  Administered 2019-08-06 – 2019-08-07 (×2): 237 mL via ORAL

## 2019-08-06 MED ORDER — ADULT MULTIVITAMIN W/MINERALS CH
1.0000 | ORAL_TABLET | Freq: Every day | ORAL | Status: DC
  Administered 2019-08-06 – 2019-08-08 (×3): 1 via ORAL
  Filled 2019-08-06 (×3): qty 1

## 2019-08-06 MED ORDER — SPIRONOLACTONE 25 MG PO TABS
200.0000 mg | ORAL_TABLET | Freq: Every day | ORAL | Status: DC
  Administered 2019-08-06 – 2019-08-08 (×3): 200 mg via ORAL
  Filled 2019-08-06 (×3): qty 8

## 2019-08-06 MED ORDER — PRO-STAT SUGAR FREE PO LIQD
30.0000 mL | Freq: Three times a day (TID) | ORAL | Status: DC
  Administered 2019-08-06 – 2019-08-08 (×6): 30 mL via ORAL
  Filled 2019-08-06 (×5): qty 30

## 2019-08-06 MED ORDER — ALBUMIN HUMAN 25 % IV SOLN
25.0000 g | Freq: Once | INTRAVENOUS | Status: AC
  Administered 2019-08-06: 25 g via INTRAVENOUS
  Filled 2019-08-06: qty 100

## 2019-08-06 NOTE — Progress Notes (Signed)
Subjective: No complaints.  Feeling better with the diuresis.  Objective: Vital signs in last 24 hours: Temp:  [99.1 F (37.3 C)-99.6 F (37.6 C)] 99.1 F (37.3 C) (02/07 1941) Pulse Rate:  [87-89] 89 (02/07 1941) Resp:  [13-19] 13 (02/07 1941) BP: (118-121)/(48-51) 121/51 (02/07 1941) SpO2:  [98 %-100 %] 100 % (02/07 1941) Weight:  [116.6 kg] 116.6 kg (02/08 0515) Last BM Date: 08/04/19  Intake/Output from previous day: No intake/output data recorded. Intake/Output this shift: Total I/O In: -  Out: 350 [Urine:350]  General appearance: alert and no distress GI: soft, non-tender; bowel sounds normal; no masses,  no organomegaly Ext: decreased edema, wrinkles in her skin. Lab Results: Recent Labs    08/04/19 0556 08/05/19 0601 08/06/19 0235  HGB 7.1* 7.8* 7.0*  HCT 21.5* 24.3* 21.2*   BMET Recent Labs    08/04/19 0556 08/05/19 0601 08/06/19 0235  NA 129* 130* 129*  K 3.4* 3.4* 3.7  CL 90* 92* 90*  CO2 28 27 28   GLUCOSE 99 103* 103*  BUN 8 6 6   CREATININE 1.24* 1.19* 1.10*  CALCIUM 8.3* 8.7* 8.7*   LFT Recent Labs    08/04/19 0556  PROT 6.9  ALBUMIN 2.2*  AST 51*  ALT 25  ALKPHOS 80  BILITOT 4.0*   PT/INR No results for input(s): LABPROT, INR in the last 72 hours. Hepatitis Panel No results for input(s): HEPBSAG, HCVAB, HEPAIGM, HEPBIGM in the last 72 hours. C-Diff No results for input(s): CDIFFTOX in the last 72 hours. Fecal Lactopherrin No results for input(s): FECLLACTOFRN in the last 72 hours.  Studies/Results: No results found.  Medications:  Scheduled: . furosemide  40 mg Intravenous BID  . nicotine  21 mg Transdermal Daily  . pantoprazole  40 mg Oral Daily  . spironolactone  200 mg Oral Daily   Continuous:   Assessment/Plan: 1) Lower extremity edema. 2) Cirrhosis. 3) Anemia - stable.   Her lower extremity edema is improved.  There are now wrinkles in her skin, which was not the situation at the time of admission.  She reports  that the pain in her legs is much better and she feels well.  The goal is to transition her to oral therapy and ensure that it produces the proper diuresis, which was not achieved as an outpatient.  I think another day of IV lasix will be helpful and then to transition her to oral tomorrow.  Her spironolactone will be increased to 200 mg.  Her weight today is at 116.8 kg, slightly higher than yesterday, but overall a downward decline.  Plan: 1) Albumin 25 grams today. 2) Continue with IV lasix with transition to oral tomorrow.  ? D/C home on Wednesday. 3) Spironolactone 200 mg today. 4) Maintain a 2 gram sodium diet.   LOS: 4 days   Yaremi Stahlman D 08/06/2019, 8:16 AM

## 2019-08-06 NOTE — Progress Notes (Signed)
PROGRESS NOTE    Stacey Monroe  NUU:[id number] DOB: Oct 08, 1960 DOA: 08/02/2019 PCP: Aletha Halim., PA-C   Brief Narrative:  HPI On 08/02/2019 by Dr. Yaakov Guthrie Stacey Monroe is a 59 y.o. female with medical history significant of chronic alcoholic liver cirrhosis, chronic microcytic anemia, hyponatremia who presented to the emergency room today with complaints of worsening shortness of breath, bilateral lower extremity edema.  She was recently admitted at [facility] on 07/31/2019 with similar complaints.  She also had recent admission at [facility] and was seen by nephrology, GI service for volume overload, anemia.  She follows up with Dr. Almyra Free from GI.  She was admitted here in January 2021.  Per Dr. 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 These lesions were successfully ablated with APC as well as removal of multiple polyps from the colon" she was given 1 unit of PRBC at [facility] and was discharged yesterday.  However, patient says that she is continuing to have worsening shortness of breath and worsening bilateral lower extremity edema with orthopnea.  She denies having any chest pain at this time.  She denies having any nausea, vomiting, abdominal pain, diarrhea or dysuria at this time.  She denies any bright red blood in her stool.  She also had echocardiogram done January 2021 which per report was normal.   Interim history Admitted for shortness of breath and edema likely secondary to liver cirrhosis.  Gastroenterology consulted. Assessment & Plan   Dyspnea/edema, likely secondary to liver cirrhosis -Patient has had chronic shortness of breath, denies any chest pain -currently maintaining oxygen saturations in the high 90s on room air -Echocardiogram July 07, 2019 showed an EF of 60-65%. -Patient appears to be clinically volume overloaded -Chest x-ray unremarkable -Patient does have a  smoking history as well as COPD -D-dimer elevated, CTA chest obtained, negative for PE. -Discussed with nephrology, Dr. Johnney Ou, who recommended continuing IV Lasix 40 mg twice daily.  May consider torsemide 40 mg daily on discharge or possibly increasing Lasix to 60 mg twice daily. -Currently on lasix 40mg  IV BID- will continue for additional day -Continue to monitor intake and output, daily weights- do not think these are accurate -no urine output over past 24 hours -Gastroenterology placed her on spironolactone 200mg  today with albumin  Alcoholic liver cirrhosis -Gastroenterology consulted and appreciated -Continue spironolactone (dose increased today) and IV Lasix  Hyponatremia -Appears to be chronic -Suspect secondary to liver cirrhosis and hypervolemia -Continue to monitor BMP  Chronic macrocytic Anemia -EGD in January 21 showed significant G AVE, AVMs in the proximal colon.  Successfully ablated with APC as well as removal of multiple polyps from the colon. -Patient recently received 1 unit PRBC at recent hospitalization a couple days ago at [facility] -Hemoglobin currently 7, continue to monitor CBC -If hemoglobin drops <7, will transfuse  UTI, ESBL E. Coli -Patient was noted to have UTI on recent admission, urine culture>100K E. coli, ESBL -given small bump in creatinine, will discontinue bactrim -discussed with pharmacy- will give dose of fosfomycin  Acute kidney injury -may be secondary to diuresis vs bactrim -creatinine improved to 1.10 from 1.24 -will discontinue bactrim and continue diuresis as patient has volume overload -continue to monitor BMP  Nicotine abuse -Discussed smoking cessation -Nicotine patch ordered  DVT Prophylaxis  SCDs  Code Status: Full  Family Communication: None at bedside  Disposition Plan: Admitted from home  for SOB secondary to volume overload.  GI consulted, currently on IV diuresis.  Disposition to be determined- likely home  in 2-3 days.  Consultants Gastroenterology Nephrology via phone  Procedures  None  Antibiotics   Anti-infectives (From admission, onward)   Start     Dose/Rate Route Frequency Ordered Stop   08/04/19 1000  fosfomycin (MONUROL) packet 3 g     3 g Oral  Once 08/04/19 0912 08/04/19 1153   08/03/19 1200  sulfamethoxazole-trimethoprim (BACTRIM) 400-80 MG per tablet 2 tablet  Status:  Discontinued     2 tablet Oral Every 12 hours 08/03/19 1132 08/04/19 0912      Subjective:   Stacey Monroe seen and examined today.  Patient feels her shortness of breath has improved as well as her leg swelling.  States she was not able to sleep well overnight.  Denies current chest pain, abdominal pain, nausea or vomiting, diarrhea constipation, dizziness or headache. Objective:   Vitals:   08/05/19 1714 08/05/19 1941 08/06/19 0515 08/06/19 0837  BP: (!) 118/48 (!) 121/51  (!) 127/52  Pulse: 87 89  84  Resp: 19 13  (!) 22  Temp: 99.6 F (37.6 C) 99.1 F (37.3 C)  97.6 F (36.4 C)  TempSrc:      SpO2: 98% 100%  98%  Weight:   116.6 kg     Intake/Output Summary (Last 24 hours) at 08/06/2019 1275 Last data filed at 08/06/2019 0800 Gross per 24 hour  Intake --  Output 350 ml  Net -350 ml   Filed Weights   08/05/19 0526 08/06/19 0515  Weight: 115.6 kg 116.6 kg   Exam  General: Well developed, well nourished, NAD, appears stated age  65: NCAT, mucous membranes moist.   Cardiovascular: S1 S2 auscultated, RRR  Respiratory: Clear to auscultation bilaterally  Abdomen: Soft, obese, nontender, nondistended, + bowel sounds  Extremities: warm dry without cyanosis clubbing. B/L LE edema improving mildly  Neuro: AAOx3, nonfocal  Psych: Appropriate mood and affect, pleasant   Data Reviewed: I have personally reviewed following labs and imaging studies  CBC: Recent Labs  Lab 07/31/19 1500 07/31/19 1500 08/01/19 0627 08/01/19 0627 08/02/19 1311 08/02/19 1311 08/03/19 0317  08/03/19 1155 08/04/19 0556 08/05/19 0601 08/06/19 0235  WBC 4.4  --  3.9*  --  3.6*  --  3.6*  --   --   --   --   NEUTROABS 2.4  --   --   --   --   --   --   --   --   --   --   HGB 7.0*   < > 7.1*   < > 8.5*   < > 7.0* 7.2* 7.1* 7.8* 7.0*  HCT 21.6*   < > 21.6*   < > 27.1*   < > 20.9* 22.0* 21.5* 24.3* 21.2*  MCV 113.7*  --  108.0*  --  111.1*  --  106.6*  --   --   --   --   PLT 138*  --  122*  --  159  --  122*  --   --   --   --    < > = values in this interval not displayed.   Basic Metabolic Panel: Recent Labs  Lab 07/31/19 1551 08/01/19 0627 08/02/19 1311 08/03/19 0317 08/04/19 0556 08/05/19 0601 08/06/19 0235  NA  --    < > 127* 129* 129* 130* 129*  K  --    < >  3.9 4.2 3.4* 3.4* 3.7  CL  --    < > 89* 93* 90* 92* 90*  CO2  --    < > 26 24 28 27 28   GLUCOSE  --    < > 109* 104* 99 103* 103*  BUN  --    < > 9 9 8 6 6   CREATININE  --    < > 1.03* 1.08* 1.24* 1.19* 1.10*  CALCIUM  --    < > 9.2 8.6* 8.3* 8.7* 8.7*  MG 1.8  --   --   --   --   --   --    < > = values in this interval not displayed.   GFR: Estimated Creatinine Clearance: 72.7 mL/min (A) (by C-G formula based on SCr of 1.1 mg/dL (H)). Liver Function Tests: Recent Labs  Lab 07/31/19 1500 08/01/19 0627 08/02/19 1311 08/03/19 0317 08/04/19 0556  AST 77* 71* 69* 58* 51*  ALT 37 33 32 27 25  ALKPHOS 121 111 116 77 80  BILITOT 3.6* 4.2* 6.4* 4.1* 4.0*  PROT 7.2 6.7 8.2* 6.5 6.9  ALBUMIN 2.4* 2.4* 2.6* 2.1* 2.2*   Recent Labs  Lab 07/31/19 1500  LIPASE 63*   Recent Labs  Lab 07/31/19 1551  AMMONIA 47*   Coagulation Profile: No results for input(s): INR, PROTIME in the last 168 hours. Cardiac Enzymes: No results for input(s): CKTOTAL, CKMB, CKMBINDEX, TROPONINI in the last 168 hours. BNP (last 3 results) No results for input(s): PROBNP in the last 8760 hours. HbA1C: No results for input(s): HGBA1C in the last 72 hours. CBG: No results for input(s): GLUCAP in the last 168  hours. Lipid Profile: No results for input(s): CHOL, HDL, LDLCALC, TRIG, CHOLHDL, LDLDIRECT in the last 72 hours. Thyroid Function Tests: No results for input(s): TSH, T4TOTAL, FREET4, T3FREE, THYROIDAB in the last 72 hours. Anemia Panel: Recent Labs    08/05/19 0601  VITAMINB12 1,291*  FOLATE 4.3*  FERRITIN 183  TIBC 207*  IRON 122   Urine analysis:    Component Value Date/Time   COLORURINE YELLOW 07/31/2019 1436   APPEARANCEUR CLEAR 07/31/2019 1436   LABSPEC 1.015 07/31/2019 1436   PHURINE 5.0 07/31/2019 1436   GLUCOSEU NEGATIVE 07/31/2019 1436   HGBUR NEGATIVE 07/31/2019 1436   BILIRUBINUR NEGATIVE 07/31/2019 1436   KETONESUR NEGATIVE 07/31/2019 1436   PROTEINUR NEGATIVE 07/31/2019 1436   UROBILINOGEN 2.0 (H) 10/29/2009 2340   NITRITE POSITIVE (A) 07/31/2019 1436   LEUKOCYTESUR MODERATE (A) 07/31/2019 1436   Sepsis Labs: @LABRCNTIP (procalcitonin:4,lacticidven:4)  ) Recent Results (from the past 240 hour(s))  SARS CORONAVIRUS 2 (TAT 6-24 HRS) Nasopharyngeal Urine, Clean Catch     Status: None   Collection Time: 07/31/19  3:53 PM   Specimen: Urine, Clean Catch; Nasopharyngeal  Result Value Ref Range Status   SARS Coronavirus 2 NEGATIVE NEGATIVE Final    Comment: (NOTE) SARS-CoV-2 target nucleic acids are NOT DETECTED. The SARS-CoV-2 RNA is generally detectable in upper and lower respiratory specimens during the acute phase of infection. Negative results do not preclude SARS-CoV-2 infection, do not rule out co-infections with other pathogens, and should not be used as the sole basis for treatment or other patient management decisions. Negative results must be combined with clinical observations, patient history, and epidemiological information. The expected result is Negative. Fact Sheet for Patients: SugarRoll.be Fact Sheet for Healthcare Providers: https://www.woods-mathews.com/ This test is not yet approved or cleared  by the [place] and  has been authorized  for detection and/or diagnosis of SARS-CoV-2 by FDA under an Emergency Use Authorization (EUA). This EUA will remain  in effect (meaning this test can be used) for the duration of the COVID-19 declaration under Section 56 4(b)(1) of the Act, 21 U.S.C. section 360bbb-3(b)(1), unless the authorization is terminated or revoked sooner. Performed at [facility], [place] [address], [place], [place] [address]   Culture, Urine     Status: Abnormal   Collection Time: 07/31/19  9:26 PM   Specimen: Urine, Clean Catch  Result Value Ref Range Status   Specimen Description   Final    URINE, CLEAN CATCH Performed at [facility], [address], [place], [place] [address]    Special Requests   Final    NONE Performed at [facility], [address] [place], [place] [address]    Culture (A)  Final    >=100,000 COLONIES/mL ESCHERICHIA COLI Confirmed Extended Spectrum Beta-Lactamase Producer (ESBL).  In bloodstream infections from ESBL organisms, carbapenems are preferred over piperacillin/tazobactam. They are shown to have a lower risk of mortality.    Report Status 08/03/2019 FINAL  Final   Organism ID, Bacteria ESCHERICHIA COLI (A)  Final      Susceptibility   Escherichia coli - MIC*    AMPICILLIN >=32 RESISTANT Resistant     CEFAZOLIN >=64 RESISTANT Resistant     CEFTRIAXONE >=64 RESISTANT Resistant     CIPROFLOXACIN 1 SENSITIVE Sensitive     GENTAMICIN <=1 SENSITIVE Sensitive     IMIPENEM <=0.25 SENSITIVE Sensitive     NITROFURANTOIN <=16 SENSITIVE Sensitive     TRIMETH/SULFA <=20 SENSITIVE Sensitive     AMPICILLIN/SULBACTAM 8 SENSITIVE Sensitive     PIP/TAZO <=4 SENSITIVE Sensitive     * >=100,000 COLONIES/mL ESCHERICHIA COLI  SARS CORONAVIRUS 2 (TAT 6-24 HRS) Nasopharyngeal Nasopharyngeal Swab     Status: None   Collection Time: 08/02/19  3:22 PM   Specimen: Nasopharyngeal Swab  Result Value Ref Range Status   SARS Coronavirus 2  NEGATIVE NEGATIVE Final    Comment: (NOTE) SARS-CoV-2 target nucleic acids are NOT DETECTED. The SARS-CoV-2 RNA is generally detectable in upper and lower respiratory specimens during the acute phase of infection. Negative results do not preclude SARS-CoV-2 infection, do not rule out co-infections with other pathogens, and should not be used as the sole basis for treatment or other patient management decisions. Negative results must be combined with clinical observations, patient history, and epidemiological information. The expected result is Negative. Fact Sheet for Patients: SugarRoll.be Fact Sheet for Healthcare Providers: https://www.woods-mathews.com/ This test is not yet approved or cleared by the [place] FDA and  has been authorized for detection and/or diagnosis of SARS-CoV-2 by FDA under an Emergency Use Authorization (EUA). This EUA will remain  in effect (meaning this test can be used) for the duration of the COVID-19 declaration under Section 56 4(b)(1) of the Act, 21 U.S.C. section 360bbb-3(b)(1), unless the authorization is terminated or revoked sooner. Performed at [facility], [place] [address], [place], [place] [address]       Radiology Studies: No results found.   Scheduled Meds: . furosemide  40 mg Intravenous BID  . nicotine  21 mg Transdermal Daily  . pantoprazole  40 mg Oral Daily  . spironolactone  200 mg Oral Daily   Continuous Infusions:   LOS: 4 days   Time Spent in minutes   30 minutes  Anyelin Mogle D.O. on 08/06/2019 at 8:37 AM  Between 7am to  7pm - Please see pager noted on [email or web address]  After 7pm go to [email or web address]  And look for the night coverage person covering for me after hours  [facility] Office  [phone]

## 2019-08-06 NOTE — Plan of Care (Signed)

## 2019-08-06 NOTE — Progress Notes (Addendum)
Initial Nutrition Assessment  DOCUMENTATION CODES:   Obesity unspecified  INTERVENTION:   -Ensure Enlive po daily, each supplement provides 350 kcal and 20 grams of protein -Multivitamin with minerals daily -Prostat liquid protein PO 30 ml TID with meals, each supplement provides 100 kcal, 15 grams protein.  NUTRITION DIAGNOSIS:   Increased nutrient needs related to chronic illness(ETOH cirrhosis) as evidenced by estimated needs.  GOAL:   Patient will meet greater than or equal to 90% of their needs  MONITOR:   PO intake, Supplement acceptance, Labs, Weight trends, I & O's  REASON FOR ASSESSMENT:   Consult Assessment of nutrition requirement/status  ASSESSMENT:   59 y.o. female with medical history significant of chronic alcoholic liver cirrhosis, chronic microcytic anemia, hyponatremia who presented to the emergency room today with complaints of worsening shortness of breath, bilateral lower extremity edema.Admitted for shortness of breath and edema likely secondary to liver cirrhosis.  **RD working remotely**  Patient continues to be diuresing this admission. Pt with history of CHF and ETOH cirrhosis with severe BLE edema. Pt with increased needs given these conditions. Will order Ensure and Prostat supplements to provide additional protein and kcals. Patient tries to follow a low sodium diet at home. Will place information on low sodium diet in discharge instructions for pt to review.   Weight on 2/7: 254 lbs. Weight slightly increased today, 257 lbs. Pt continues to be diuresed. Per nursing documentation, pt with severe BLE edema.  Medications: IV Lasix Labs reviewed: Low Na  NUTRITION - FOCUSED PHYSICAL EXAM:  Working remotely.  Diet Order:   Diet Order            Diet 2 gram sodium Room service appropriate? Yes; Fluid consistency: Thin  Diet effective now              EDUCATION NEEDS:   Education needs have been addressed  Skin:  Skin Assessment:  Reviewed RN Assessment  Last BM:  2/6  Height:   Ht Readings from Last 1 Encounters:  07/31/19 5\' 7"  (1.702 m)    Weight:   Wt Readings from Last 1 Encounters:  08/06/19 116.6 kg    Ideal Body Weight:  61.3 kg  BMI:  Body mass index is 40.26 kg/m.  Estimated Nutritional Needs:   Kcal:  1600-1800  Protein:  90-100g  Fluid:  Per MD  Mendel Ryder, MS, RD, LDN Inpatient Clinical Dietitian Contact information available via Amion

## 2019-08-06 NOTE — Discharge Instructions (Signed)

## 2019-08-07 LAB — BASIC METABOLIC PANEL
Anion gap: 10 (ref 5–15)
BUN: 6 mg/dL (ref 6–20)
CO2: 26 mmol/L (ref 22–32)
Calcium: 8.5 mg/dL — ABNORMAL LOW (ref 8.9–10.3)
Chloride: 93 mmol/L — ABNORMAL LOW (ref 98–111)
Creatinine, Ser: 0.88 mg/dL (ref 0.44–1.00)
GFR calc Af Amer: >60 mL/min (ref >60)
GFR calc non Af Amer: >60 mL/min (ref >60)
Glucose, Bld: 99 mg/dL (ref 70–99)
Potassium: 3.6 mmol/L (ref 3.5–5.1)
Sodium: 129 mmol/L — ABNORMAL LOW (ref 135–145)

## 2019-08-07 LAB — HEMOGLOBIN AND HEMATOCRIT, BLOOD
HCT: 22.6 % — ABNORMAL LOW (ref 36.0–46.0)
Hemoglobin: 7.3 g/dL — ABNORMAL LOW (ref 12.0–15.0)

## 2019-08-07 MED ORDER — FUROSEMIDE 40 MG PO TABS
40.0000 mg | ORAL_TABLET | Freq: Two times a day (BID) | ORAL | Status: DC
  Administered 2019-08-07 – 2019-08-08 (×3): 40 mg via ORAL
  Filled 2019-08-07 (×3): qty 1

## 2019-08-07 NOTE — Progress Notes (Signed)
Subjective: Feeling better.  Objective: Vital signs in last 24 hours: Temp:  [97.6 F (36.4 C)-98.2 F (36.8 C)] 97.8 F (36.6 C) (02/09 0127) Pulse Rate:  [84-94] 94 (02/09 0127) Resp:  [22-26] 26 (02/08 1623) BP: (121-127)/(52-59) 127/59 (02/09 0127) SpO2:  [95 %-98 %] 95 % (02/09 0127) Weight:  [116.3 kg] 116.3 kg (02/09 0256) Last BM Date: 08/04/19  Intake/Output from previous day: 02/08 0701 - 02/09 0700 In: 803.2 [P.O.:720; IV Piggyback:83.2] Out: 2125 [Urine:2125] Intake/Output this shift: No intake/output data recorded.  General appearance: alert and no distress GI: soft, non-tender; bowel sounds normal; no masses,  no organomegaly Extremities: edema 4+, but decreasing  Lab Results: Recent Labs    08/05/19 0601 08/06/19 0235 08/07/19 0240  HGB 7.8* 7.0* 7.3*  HCT 24.3* 21.2* 22.6*   BMET Recent Labs    08/05/19 0601 08/06/19 0235 08/07/19 0240  NA 130* 129* 129*  K 3.4* 3.7 3.6  CL 92* 90* 93*  CO2 27 28 26   GLUCOSE 103* 103* 99  BUN 6 6 6   CREATININE 1.19* 1.10* 0.88  CALCIUM 8.7* 8.7* 8.5*   LFT No results for input(s): PROT, ALBUMIN, AST, ALT, ALKPHOS, BILITOT, BILIDIR, IBILI in the last 72 hours. PT/INR No results for input(s): LABPROT, INR in the last 72 hours. Hepatitis Panel No results for input(s): HEPBSAG, HCVAB, HEPAIGM, HEPBIGM in the last 72 hours. C-Diff No results for input(s): CDIFFTOX in the last 72 hours. Fecal Lactopherrin No results for input(s): FECLLACTOFRN in the last 72 hours.  Studies/Results: No results found.  Medications:  Scheduled: . feeding supplement (ENSURE ENLIVE)  237 mL Oral Q24H  . feeding supplement (PRO-STAT SUGAR FREE 64)  30 mL Oral TID  . furosemide  40 mg Oral BID  . multivitamin with minerals  1 tablet Oral Daily  . nicotine  21 mg Transdermal Daily  . pantoprazole  40 mg Oral Daily  . spironolactone  200 mg Oral Daily   Continuous:   Assessment/Plan: 1) Lower extremity edema. 2)  Cirrhosis.   She diuresed approximately 2 liter yesterday and she is negative 1300 ml.  Her weight was only slightly lower than yesterday at 116.3 kg.  She will be converted to oral lasix at 40 mg BID.  She may ultimately be increased up to 80 mg BID as an outpatient.  If she can demonstrated adequate diuresis with the Step II diuretic regimen - Lasix 80 mg QD and Spironolactone 200 mg QD, she can be discharged home.  ? Tomorrow.  Her lower extremities are exhibiting more wrinkles.  LOS: 5 days   Somaya Grassi D 08/07/2019, 7:09 AM

## 2019-08-07 NOTE — Progress Notes (Signed)
PROGRESS NOTE    Stacey Monroe  SAY:[id number] DOB: [birth year] DOA: 08/02/2019 PCP: Aletha Halim., PA-C   Brief Narrative:  HPI On 08/02/2019 by Dr. Yaakov Guthrie Stacey Monroe is a 59 y.o. female with medical history significant of chronic alcoholic liver cirrhosis, chronic microcytic anemia, hyponatremia who presented to the emergency room today with complaints of worsening shortness of breath, bilateral lower extremity edema.  She was recently admitted at [facility] on 07/31/2019 with similar complaints.  She also had recent admission at [facility] and was seen by nephrology, GI service for volume overload, anemia.  She follows up with Dr. Almyra Free from GI.  She was admitted here in January 2021.  Per Dr. 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 These lesions were successfully ablated with APC as well as removal of multiple polyps from the colon" she was given 1 unit of PRBC at [facility] and was discharged yesterday.  However, patient says that she is continuing to have worsening shortness of breath and worsening bilateral lower extremity edema with orthopnea.  She denies having any chest pain at this time.  She denies having any nausea, vomiting, abdominal pain, diarrhea or dysuria at this time.  She denies any bright red blood in her stool.  She also had echocardiogram done January 2021 which per report was normal.   Interim history Admitted for shortness of breath and edema likely secondary to liver cirrhosis.  Gastroenterology consulted. Assessment & Plan   Dyspnea/edema, likely secondary to liver cirrhosis -Patient has had chronic shortness of breath, denies any chest pain -currently maintaining oxygen saturations in the high 90s on room air -Echocardiogram July 07, 2019 showed an EF of 60-65%. -Patient appears to be clinically volume overloaded -Chest x-ray unremarkable -Patient does have a  smoking history as well as COPD -D-dimer elevated, CTA chest obtained, negative for PE. -Discussed with nephrology, Dr. Johnney Ou, who recommended continuing IV Lasix 40 mg twice daily.  May consider torsemide 40 mg daily on discharge or possibly increasing Lasix to 60 mg twice daily. -was placed on IV lasix 40mg  BID, and now transitioned to lasix 40mg  BID oral by GI -Continue to monitor intake and output, daily weights -UOP over the apst 24hrs 2125cc -Continue spironolactone   Alcoholic liver cirrhosis -Gastroenterology consulted and appreciated -Continue spironolactone (dose increased today) and Lasix  Hyponatremia -Appears to be chronic, stable -Suspect secondary to liver cirrhosis and hypervolemia -Continue to monitor BMP  Chronic macrocytic Anemia -EGD in January 21 showed significant G AVE, AVMs in the proximal colon.  Successfully ablated with APC as well as removal of multiple polyps from the colon. -Patient recently received 1 unit PRBC at recent hospitalization a couple days ago at [facility] -Hemoglobin currently 7.3, continue to monitor CBC -If hemoglobin drops <7, will transfuse  UTI, ESBL E. Coli -Patient was noted to have UTI on recent admission, urine culture>100K E. coli, ESBL -given small bump in creatinine, will discontinue bactrim -discussed with pharmacy- given dose of fosfomycin  Acute kidney injury -may be secondary to diuresis vs bactrim -creatinine improved to 0.88 from 1.24 -will discontinue bactrim and continue diuresis as patient has volume overload -continue to monitor BMP  Nicotine abuse -Discussed smoking cessation -Nicotine patch ordered  DVT Prophylaxis  SCDs  Code Status: Full  Family Communication: None at bedside  Disposition Plan: Admitted from home for SOB secondary to volume overload.  GI  consulted, currently on IV diuresis.  Disposition to be determined- likely home in on 2/10 if cleared by  GI.  Consultants Gastroenterology Nephrology via phone  Procedures  None  Antibiotics   Anti-infectives (From admission, onward)   Start     Dose/Rate Route Frequency Ordered Stop   08/04/19 1000  fosfomycin (MONUROL) packet 3 g     3 g Oral  Once 08/04/19 0912 08/04/19 1153   08/03/19 1200  sulfamethoxazole-trimethoprim (BACTRIM) 400-80 MG per tablet 2 tablet  Status:  Discontinued     2 tablet Oral Every 12 hours 08/03/19 1132 08/04/19 0912      Subjective:   Stacey Monroe seen and examined today.  Feels breathing has improved and feels her leg swelling is improving as well.  States she wants to sleep in this morning.she denies current chest pain, abdominal pain, nausea or vomiting, diarrhea or constipation, dizziness or headache.   Objective:   Vitals:   08/06/19 1623 08/07/19 0127 08/07/19 0256 08/07/19 0743  BP: (!) 121/56 (!) 127/59  (!) 115/51  Pulse: 87 94  93  Resp: (!) 26   17  Temp: 98.2 F (36.8 C) 97.8 F (36.6 C)  97.8 F (36.6 C)  TempSrc:  Oral  Oral  SpO2: 98% 95%  96%  Weight:   116.3 kg     Intake/Output Summary (Last 24 hours) at 08/07/2019 1937 Last data filed at 08/07/2019 0132 Gross per 24 hour  Intake 803.24 ml  Output 1775 ml  Net -971.76 ml   Filed Weights   08/05/19 0526 08/06/19 0515 08/07/19 0256  Weight: 115.6 kg 116.6 kg 116.3 kg   Exam  General: Well developed, well nourished, NAD, appears stated age  14: NCAT, mucous membranes moist.   Cardiovascular: S1 S2 auscultated, RRR  Respiratory: Clear to auscultation bilaterally   Abdomen: Soft, obese, nontender, nondistended, + bowel sounds  Extremities: warm dry without cyanosis clubbing. +++LE edema B/L- improving   Neuro: AAOx3, nonfocal  Psych: Appropriate mood and affect    Data Reviewed: I have personally reviewed following labs and imaging studies  CBC: Recent Labs  Lab 07/31/19 1500 07/31/19 1500 08/01/19 0627 08/01/19 0627 08/02/19 1311 08/02/19 1311  08/03/19 0317 08/03/19 0317 08/03/19 1155 08/04/19 0556 08/05/19 0601 08/06/19 0235 08/07/19 0240  WBC 4.4  --  3.9*  --  3.6*  --  3.6*  --   --   --   --   --   --   NEUTROABS 2.4  --   --   --   --   --   --   --   --   --   --   --   --   HGB 7.0*   < > 7.1*   < > 8.5*   < > 7.0*   < > 7.2* 7.1* 7.8* 7.0* 7.3*  HCT 21.6*   < > 21.6*   < > 27.1*   < > 20.9*   < > 22.0* 21.5* 24.3* 21.2* 22.6*  MCV 113.7*  --  108.0*  --  111.1*  --  106.6*  --   --   --   --   --   --   PLT 138*  --  122*  --  159  --  122*  --   --   --   --   --   --    < > = values in this interval not displayed.   Basic Metabolic Panel:  Recent Labs  Lab 07/31/19 1551 08/01/19 0627 08/03/19 0317 08/04/19 0556 08/05/19 0601 08/06/19 0235 08/07/19 0240  NA  --    < > 129* 129* 130* 129* 129*  K  --    < > 4.2 3.4* 3.4* 3.7 3.6  CL  --    < > 93* 90* 92* 90* 93*  CO2  --    < > 24 28 27 28 26   GLUCOSE  --    < > 104* 99 103* 103* 99  BUN  --    < > 9 8 6 6 6   CREATININE  --    < > 1.08* 1.24* 1.19* 1.10* 0.88  CALCIUM  --    < > 8.6* 8.3* 8.7* 8.7* 8.5*  MG 1.8  --   --   --   --   --   --    < > = values in this interval not displayed.   GFR: Estimated Creatinine Clearance: 90.7 mL/min (by C-G formula based on SCr of 0.88 mg/dL). Liver Function Tests: Recent Labs  Lab 07/31/19 1500 08/01/19 0627 08/02/19 1311 08/03/19 0317 08/04/19 0556  AST 77* 71* 69* 58* 51*  ALT 37 33 32 27 25  ALKPHOS 121 111 116 77 80  BILITOT 3.6* 4.2* 6.4* 4.1* 4.0*  PROT 7.2 6.7 8.2* 6.5 6.9  ALBUMIN 2.4* 2.4* 2.6* 2.1* 2.2*   Recent Labs  Lab 07/31/19 1500  LIPASE 63*   Recent Labs  Lab 07/31/19 1551  AMMONIA 47*   Coagulation Profile: No results for input(s): INR, PROTIME in the last 168 hours. Cardiac Enzymes: No results for input(s): CKTOTAL, CKMB, CKMBINDEX, TROPONINI in the last 168 hours. BNP (last 3 results) No results for input(s): PROBNP in the last 8760 hours. HbA1C: No results for  input(s): HGBA1C in the last 72 hours. CBG: No results for input(s): GLUCAP in the last 168 hours. Lipid Profile: No results for input(s): CHOL, HDL, LDLCALC, TRIG, CHOLHDL, LDLDIRECT in the last 72 hours. Thyroid Function Tests: No results for input(s): TSH, T4TOTAL, FREET4, T3FREE, THYROIDAB in the last 72 hours. Anemia Panel: Recent Labs    08/05/19 0601  VITAMINB12 1,291*  FOLATE 4.3*  FERRITIN 183  TIBC 207*  IRON 122   Urine analysis:    Component Value Date/Time   COLORURINE YELLOW 07/31/2019 1436   APPEARANCEUR CLEAR 07/31/2019 1436   LABSPEC 1.015 07/31/2019 1436   PHURINE 5.0 07/31/2019 1436   GLUCOSEU NEGATIVE 07/31/2019 1436   HGBUR NEGATIVE 07/31/2019 1436   BILIRUBINUR NEGATIVE 07/31/2019 1436   KETONESUR NEGATIVE 07/31/2019 1436   PROTEINUR NEGATIVE 07/31/2019 1436   UROBILINOGEN 2.0 (H) 10/29/2009 2340   NITRITE POSITIVE (A) 07/31/2019 1436   LEUKOCYTESUR MODERATE (A) 07/31/2019 1436   Sepsis Labs: @LABRCNTIP (procalcitonin:4,lacticidven:4)  ) Recent Results (from the past 240 hour(s))  SARS CORONAVIRUS 2 (TAT 6-24 HRS) Nasopharyngeal Urine, Clean Catch     Status: None   Collection Time: 07/31/19  3:53 PM   Specimen: Urine, Clean Catch; Nasopharyngeal  Result Value Ref Range Status   SARS Coronavirus 2 NEGATIVE NEGATIVE Final    Comment: (NOTE) SARS-CoV-2 target nucleic acids are NOT DETECTED. The SARS-CoV-2 RNA is generally detectable in upper and lower respiratory specimens during the acute phase of infection. Negative results do not preclude SARS-CoV-2 infection, do not rule out co-infections with other pathogens, and should not be used as the sole basis for treatment or other patient management decisions. Negative results must be combined with clinical observations, patient  history, and epidemiological information. The expected result is Negative. Fact Sheet for Patients: SugarRoll.be Fact Sheet for Healthcare  Providers: https://www.woods-mathews.com/ This test is not yet approved or cleared by the [place] FDA and  has been authorized for detection and/or diagnosis of SARS-CoV-2 by FDA under an Emergency Use Authorization (EUA). This EUA will remain  in effect (meaning this test can be used) for the duration of the COVID-19 declaration under Section 56 4(b)(1) of the Act, 21 U.S.C. section 360bbb-3(b)(1), unless the authorization is terminated or revoked sooner. Performed at [facility], [place] [address] [place], [place] [address]   Culture, Urine     Status: Abnormal   Collection Time: 07/31/19  9:26 PM   Specimen: Urine, Clean Catch  Result Value Ref Range Status   Specimen Description   Final    URINE, CLEAN CATCH Performed at [facility], [address], [place], [place] [address]    Special Requests   Final    NONE Performed at [facility], [address], [place], [place] [address]    Culture (A)  Final    >=100,000 COLONIES/mL ESCHERICHIA COLI Confirmed Extended Spectrum Beta-Lactamase Producer (ESBL).  In bloodstream infections from ESBL organisms, carbapenems are preferred over piperacillin/tazobactam. They are shown to have a lower risk of mortality.    Report Status 08/03/2019 FINAL  Final   Organism ID, Bacteria ESCHERICHIA COLI (A)  Final      Susceptibility   Escherichia coli - MIC*    AMPICILLIN >=32 RESISTANT Resistant     CEFAZOLIN >=64 RESISTANT Resistant     CEFTRIAXONE >=64 RESISTANT Resistant     CIPROFLOXACIN 1 SENSITIVE Sensitive     GENTAMICIN <=1 SENSITIVE Sensitive     IMIPENEM <=0.25 SENSITIVE Sensitive     NITROFURANTOIN <=16 SENSITIVE Sensitive     TRIMETH/SULFA <=20 SENSITIVE Sensitive     AMPICILLIN/SULBACTAM 8 SENSITIVE Sensitive     PIP/TAZO <=4 SENSITIVE Sensitive     * >=100,000 COLONIES/mL ESCHERICHIA COLI  SARS CORONAVIRUS 2 (TAT 6-24 HRS) Nasopharyngeal Nasopharyngeal Swab     Status: None   Collection Time: 08/02/19   3:22 PM   Specimen: Nasopharyngeal Swab  Result Value Ref Range Status   SARS Coronavirus 2 NEGATIVE NEGATIVE Final    Comment: (NOTE) SARS-CoV-2 target nucleic acids are NOT DETECTED. The SARS-CoV-2 RNA is generally detectable in upper and lower respiratory specimens during the acute phase of infection. Negative results do not preclude SARS-CoV-2 infection, do not rule out co-infections with other pathogens, and should not be used as the sole basis for treatment or other patient management decisions. Negative results must be combined with clinical observations, patient history, and epidemiological information. The expected result is Negative. Fact Sheet for Patients: SugarRoll.be Fact Sheet for Healthcare Providers: https://www.woods-mathews.com/ This test is not yet approved or cleared by the [place] FDA and  has been authorized for detection and/or diagnosis of SARS-CoV-2 by FDA under an Emergency Use Authorization (EUA). This EUA will remain  in effect (meaning this test can be used) for the duration of the COVID-19 declaration under Section 56 4(b)(1) of the Act, 21 U.S.C. section 360bbb-3(b)(1), unless the authorization is terminated or revoked sooner. Performed at [facility], [place] [address], [place],  [address]       Radiology Studies: No results found.   Scheduled Meds: . feeding supplement (ENSURE ENLIVE)  237 mL Oral Q24H  . feeding supplement (PRO-STAT SUGAR FREE 64)  30 mL Oral TID  .  furosemide  40 mg Oral BID  . multivitamin with minerals  1 tablet Oral Daily  . nicotine  21 mg Transdermal Daily  . pantoprazole  40 mg Oral Daily  . spironolactone  200 mg Oral Daily   Continuous Infusions:   LOS: 5 days   Time Spent in minutes   30 minutes  Sharyl Panchal D.O. on 08/07/2019 at 8:32 AM  Between 7am to 7pm - Please see pager noted on [email or web address]  After 7pm go to [email or web address]  And look for the  night coverage person covering for me after hours  [facility] Office  [phone]

## 2019-08-08 LAB — BASIC METABOLIC PANEL
Anion gap: 7 (ref 5–15)
BUN: 8 mg/dL (ref 6–20)
CO2: 27 mmol/L (ref 22–32)
Calcium: 8.4 mg/dL — ABNORMAL LOW (ref 8.9–10.3)
Chloride: 96 mmol/L — ABNORMAL LOW (ref 98–111)
Creatinine, Ser: 0.85 mg/dL (ref 0.44–1.00)
GFR calc Af Amer: >60 mL/min (ref >60)
GFR calc non Af Amer: >60 mL/min (ref >60)
Glucose, Bld: 97 mg/dL (ref 70–99)
Potassium: 3.5 mmol/L (ref 3.5–5.1)
Sodium: 130 mmol/L — ABNORMAL LOW (ref 135–145)

## 2019-08-08 LAB — HEMOGLOBIN AND HEMATOCRIT, BLOOD
HCT: 22.6 % — ABNORMAL LOW (ref 36.0–46.0)
Hemoglobin: 7.3 g/dL — ABNORMAL LOW (ref 12.0–15.0)

## 2019-08-08 MED ORDER — FUROSEMIDE 40 MG PO TABS: 40.0000 mg | ORAL_TABLET | Freq: Two times a day (BID) | ORAL | 0 refills | Status: DC

## 2019-08-08 MED ORDER — SPIRONOLACTONE 100 MG PO TABS: 200.0000 mg | ORAL_TABLET | Freq: Every day | ORAL | 0 refills | Status: AC

## 2019-08-08 NOTE — Progress Notes (Signed)
Subjective: Feeling well.  No complaints.  Objective: Vital signs in last 24 hours: Temp:  [97.9 F (36.6 C)-98.1 F (36.7 C)] 97.9 F (36.6 C) (02/09 2325) Pulse Rate:  [90-93] 93 (02/09 2325) Resp:  [18] 18 (02/09 1615) BP: (116-117)/(45-93) 116/45 (02/09 2325) SpO2:  [96 %-99 %] 96 % (02/09 2325) Weight:  [114.4 kg] 114.4 kg (02/10 0500) Last BM Date: 08/06/19  Intake/Output from previous day: No intake/output data recorded. Intake/Output this shift: No intake/output data recorded.  General appearance: alert and no distress GI: soft, non-tender; bowel sounds normal; no masses,  no organomegaly Extremities: 4+ edema, but improving  Lab Results: Recent Labs    08/06/19 0235 08/07/19 0240 08/08/19 0231  HGB 7.0* 7.3* 7.3*  HCT 21.2* 22.6* 22.6*   BMET Recent Labs    08/06/19 0235 08/07/19 0240 08/08/19 0231  NA 129* 129* 130*  K 3.7 3.6 3.5  CL 90* 93* 96*  CO2 28 26 27   GLUCOSE 103* 99 97  BUN 6 6 8   CREATININE 1.10* 0.88 0.85  CALCIUM 8.7* 8.5* 8.4*   LFT No results for input(s): PROT, ALBUMIN, AST, ALT, ALKPHOS, BILITOT, BILIDIR, IBILI in the last 72 hours. PT/INR No results for input(s): LABPROT, INR in the last 72 hours. Hepatitis Panel No results for input(s): HEPBSAG, HCVAB, HEPAIGM, HEPBIGM in the last 72 hours. C-Diff No results for input(s): CDIFFTOX in the last 72 hours. Fecal Lactopherrin No results for input(s): FECLLACTOFRN in the last 72 hours.  Studies/Results: No results found.  Medications:  Scheduled: . feeding supplement (ENSURE ENLIVE)  237 mL Oral Q24H  . feeding supplement (PRO-STAT SUGAR FREE 64)  30 mL Oral TID  . furosemide  40 mg Oral BID  . multivitamin with minerals  1 tablet Oral Daily  . nicotine  21 mg Transdermal Daily  . pantoprazole  40 mg Oral Daily  . spironolactone  200 mg Oral Daily   Continuous:   Assessment/Plan: 1) LE edema. 2) Cirrhosis.   The patient's weight is at 114.4 kg today.  Her total  weight reduction is 14 lbs.  Her electrolytes and HGB are stable.  She reports having significant diuresis last evening with the oral dosing.  Dr. Aldine Contes management much appreciated.  Plan: 1) Okay to D/C home. 2) D/C home with Lasix 40 mg BID and Spironolactone 200 mg QD. 3) Follow up in the office in 1 week. 4) Signing off.  LOS: 6 days   Stacey Monroe D 08/08/2019, 7:55 AM

## 2019-08-08 NOTE — Discharge Summary (Signed)
Physician Discharge Summary  Stacey Monroe:[id number] DOB: [birth year] DOA: 08/02/2019  PCP: Aletha Halim., PA-C  Admit date: 08/02/2019 Discharge date: 08/08/2019  Admitted From: Home Disposition: Home   Recommendations for Outpatient Follow-up:  1. Follow up with PCP in 1-2 weeks 2. Please obtain CMP/CBC in one week 3. Follow up cavitary pulmonary lesion seen on imaging (d/w patient) 4. With advanced and progressing cirrhosis, strongly recommend palliative care involvement for this patient going forward.  Home Health: None Equipment/Devices: None  Discharge Condition: Stable CODE STATUS: Full Diet recommendation: Low salt  Brief/Interim Summary: HPI On 08/02/2019 by Dr. Tyrone Sage Clemmonsis a 59 y.o.femalewith medical history significant ofchronic alcoholic liver cirrhosis, chronic microcytic anemia, hyponatremia who presented to the emergency room today with complaints of worsening shortness of breath, bilateral lower extremity edema. She was recently admitted at [facility] on 07/31/2019 with similar complaints. She also had recent admission at [facility] and was seen by nephrology, GI service for volume overload, anemia. She follows up with Dr. Almyra Free from GI. She was admitted here in January 2021. Per Dr. 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 These lesions were successfully ablated with APC as well as removal of multiple polyps from the colon"she was given 1 unit of PRBC at [facility] and was dischargedyesterday. However, patient says that she is continuing to have worsening shortness of breath and worsening bilateral lower extremity edema with orthopnea. She denies having any chest pain at this time. She denies having any nausea, vomiting, abdominal pain, diarrhea or dysuria at this time. She denies any bright red blood in her stool. She also had echocardiogram done  January 2021 which per report was normal.   Interim history Admitted for shortness of breath and edema likely secondary to liver cirrhosis.  Gastroenterology consulted and directed diuretic therapy with clinical improvement.  Discharge Diagnoses:  Principal Problem:   Shortness of breath Active Problems:   Cirrhosis (HCC)   Tobacco abuse   Anemia   Hyponatremia   Edema  Dyspnea/edema, likely secondary to liver cirrhosis:  -Patient has had chronic shortness of breath, denies any chest pain -currently maintaining oxygen saturations in the high 90s on room air -Echocardiogram July 07, 2019 showed an EF of 60-65%. -Patient appears to be clinically volume overloaded -Chest x-ray unremarkable -Patient does have a smoking history as well as COPD -D-dimer elevated, CTA chest obtained, negative for PE. -was placed on IV lasix 40mg  BID, and now transitioned to lasix 40mg  BID oral and spironolactone 200mg  daily by GI who agrees with discharge and will arrange follow up after DC.  Alcoholic liver cirrhosis: GI consulted and directed diuresis as above.  - Cessation counseling.  Hyponatremia: Chronic, stable. Secondary to liver cirrhosis and hypervolemia - Monitor at follow up  Chronic macrocytic anemia: Due to cirrhosis and chronic blood loss anemia.  -EGD in January 21 showed significant G AVE, AVMs in the proximal colon.  Successfully ablated with APC as well as removal of multiple polyps from the colon. -Patient recently received 1 unit PRBC at recent hospitalization a couple days ago at [facility] -Hemoglobin currently 7.3 and stable. No symptoms attributable to this, so no transfusion support necessary. Will need blood counts followed after discharge.   ESBL E. 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 Differential considerations include cavitary pneumonia versus cavitary neoplasm. Consider one of the following in 3 months for both low-risk and high-risk individuals: (a) repeat chest CT, (b) follow-up PET-CT, or (c) referral to multi disciplinary thoracic group.  Discharge Instructions Discharge Instructions    Diet - low sodium heart healthy   Complete by: As directed    Discharge instructions   Complete by: As directed    You will need to continue taking lasix 40mg  twice daily (new prescription sent to your pharmacy) and spironolactone 200mg  once daily (new prescription sent to your pharmacy). Please follow up with your primary doctor and mention a spot on the lung that will need repeated imaging in about 3 months. Also, as he mentioned, follow up with Dr. Benson Norway in the next week.   If you experience shortness of breath, any bleeding, abdominal pain, or decreased urine output, seek medical attention right away.   Increase activity slowly   Complete by: As directed      Allergies as of 08/08/2019      Reactions   Penicillin G Hives   Did it involve swelling of the face/tongue/throat, SOB, or low BP?No Did it involve sudden or severe rash/hives, skin peeling, or any reaction on the inside of your mouth or nose? Yes Did you need to seek medical attention at a hospital or doctor's office? No When did it last happen?Child If all above answers are "NO", may proceed with cephalosporin use.      Medication List    STOP taking these medications   ciprofloxacin 500 MG tablet Commonly known as: Cipro     TAKE these medications   acetaminophen 500 MG tablet Commonly known as: TYLENOL Take 500 mg by mouth every 6 (six) hours as needed for mild pain, moderate pain or headache.   furosemide 40 MG  tablet Commonly known as: LASIX Take 1 tablet (40 mg total) by mouth 2 (two) times daily. Call Carol Ada, MD for refills   omeprazole 10 MG capsule Commonly known as: PRILOSEC Take 10 mg by mouth daily.   spironolactone 100 MG tablet Commonly known as: ALDACTONE Take 2 tablets (200 mg total) by mouth daily. What changed: how much to take   zolpidem 10 MG tablet Commonly known as: AMBIEN Take 10 mg by mouth at bedtime.      Follow-up Information    Aletha Halim., PA-C. Schedule an appointment as soon as possible for a visit in 1 week(s).   Specialty: Family Medicine Why: Make sure to follow up lung lesion Contact information: [address] [place] [address] [phone]        Carol Ada, MD. Schedule an appointment as soon as possible for a visit in 1 week(s).   Specialty: Gastroenterology Contact information: [place], [place] [address] [phone]          Allergies  Allergen Reactions  . Penicillin G Hives    Did it involve swelling of the face/tongue/throat, SOB, or low BP?No Did it involve sudden or severe rash/hives, skin peeling, or any reaction on the inside of your mouth or nose? Yes Did you need to seek medical attention at a hospital or doctor's office? No When did it last happen?Child If all above answers are "NO", may  proceed with cephalosporin use.    Consultations:  GI  Procedures/Studies: DG Chest 2 View  Result Date: 08/02/2019 CLINICAL DATA:  Short of breath for several days, dry cough, mid chest pain EXAM: CHEST - 2 VIEW COMPARISON:  07/31/2019 FINDINGS: Frontal and lateral views of the chest demonstrate a stable cardiac silhouette. No acute airspace disease, effusion, or pneumothorax. The lungs are hyperinflated. Subsegmental atelectasis or scarring within the right middle lobe. No acute bony abnormalities. IMPRESSION: 1. Stable exam, no acute process. Electronically Signed   By: Randa Ngo M.D.   On: 08/02/2019 13:09   CT Angio Chest PE W and/or Wo Contrast  Result Date: 08/02/2019 CLINICAL DATA:  Short of breath. Bilateral leg swelling. EXAM: CT ANGIOGRAPHY CHEST WITH CONTRAST TECHNIQUE: Multidetector CT imaging of the chest was performed using the standard protocol during bolus administration of intravenous contrast. Multiplanar CT image reconstructions and MIPs were obtained to evaluate the vascular anatomy. CONTRAST:  120mL OMNIPAQUE IOHEXOL 350 MG/ML SOLN COMPARISON:  None FINDINGS: Cardiovascular: Satisfactory opacification of the pulmonary arteries to the segmental level. No evidence of pulmonary embolism. Normal heart size. No pericardial effusion. Aortic atherosclerosis. Lad and RCA coronary artery calcifications. Mediastinum/Nodes: The Normal appearance of the thyroid gland. The trachea appears patent and is midline. Normal appearance of the esophagus. No supraclavicular, axillary, mediastinal or hilar adenopathy. Lungs/Pleura: Subsegmental atelectasis is identified within the right middle lobe. Cavitary lung mass within the posteromedial right lower lobe is identified measuring 3.4 x 3.4 cm, image 101/7. Small emphysematous bulla noted in the right apex. Upper Abdomen: Marked scratch set advanced changes of cirrhosis identified. Attenuation and enhancement of the liver is diffusely heterogeneous. Phase of contrast administration limits assessment for underlying liver lesion. Gallstones. Musculoskeletal: No chest wall abnormality. No acute or significant osseous findings. Review of the MIP images confirms the above findings. IMPRESSION: 1. No evidence for acute pulmonary embolus. 2. Cavitary lung mass within the posteromedial right lower lobe is identified. Differential considerations include cavitary pneumonia versus cavitary neoplasm. Consider one of the following in 3 months for both low-risk and high-risk individuals: (a) repeat chest CT, (b) follow-up PET-CT, or (c) referral to  multi disciplinary thoracic group. 3. Advanced changes of cirrhosis. 4. Gallstones. 5. Multi vessel coronary artery calcifications. Aortic Atherosclerosis (ICD10-I70.0). Electronically Signed   By: Kerby Moors M.D.   On: 08/02/2019 18:33   DG Chest Port 1 View  Result Date: 07/31/2019 CLINICAL DATA:  Shortness of breath and lower extremity swelling. EXAM: PORTABLE CHEST 1 VIEW COMPARISON:  None. FINDINGS: Mild linear atelectasis is seen within the right lung base. There is no evidence of a pleural effusion or pneumothorax. There is mild to moderate severity enlargement of the cardiac silhouette. Mild calcification of the aortic arch is seen. Degenerative changes seen throughout the thoracic spine. IMPRESSION: 1. Mild right basilar linear atelectasis. 2. Cardiomegaly. Electronically Signed   By: Virgina Norfolk M.D.   On: 07/31/2019 15:16     Subjective: Feels well, breathing at baseline.   Discharge Exam: Vitals:   08/07/19 2325 08/08/19 0801  BP: (!) 116/45 (!) 122/59  Pulse: 93 82  Resp:  20  Temp: 97.9 F (36.6 C) 98 F (36.7 C)  SpO2: 96% 98%   General: Pt is alert, awake, not in acute distress Respiratory: Nonlabored and clear Abdominal: Soft, NT, ND, bowel sounds + Extremities: 4+ diffuse, mostly dependent edema which patient reports is back to her baseline.   Labs: BNP (last 3 results) Recent Labs  07/31/19 1500  BNP 701.7*   Basic Metabolic Panel: Recent Labs  Lab 08/04/19 0556 08/05/19 0601 08/06/19 0235 08/07/19 0240 08/08/19 0231  NA 129* 130* 129* 129* 130*  K 3.4* 3.4* 3.7 3.6 3.5  CL 90* 92* 90* 93* 96*  CO2 28 27 28 26 27   GLUCOSE 99 103* 103* 99 97  BUN 8 6 6 6 8   CREATININE 1.24* 1.19* 1.10* 0.88 0.85  CALCIUM 8.3* 8.7* 8.7* 8.5* 8.4*   Liver Function Tests: Recent Labs  Lab 08/02/19 1311 08/03/19 0317 08/04/19 0556  AST 69* 58* 51*  ALT 32 27 25  ALKPHOS 116 77 80  BILITOT 6.4* 4.1* 4.0*  PROT 8.2* 6.5 6.9  ALBUMIN 2.6* 2.1* 2.2*    No results for input(s): LIPASE, AMYLASE in the last 168 hours. No results for input(s): AMMONIA in the last 168 hours. CBC: Recent Labs  Lab 08/02/19 1311 08/02/19 1311 08/03/19 0317 08/03/19 1155 08/04/19 0556 08/05/19 0601 08/06/19 0235 08/07/19 0240 08/08/19 0231  WBC 3.6*  --  3.6*  --   --   --   --   --   --   HGB 8.5*   < > 7.0*   < > 7.1* 7.8* 7.0* 7.3* 7.3*  HCT 27.1*   < > 20.9*   < > 21.5* 24.3* 21.2* 22.6* 22.6*  MCV 111.1*  --  106.6*  --   --   --   --   --   --   PLT 159  --  122*  --   --   --   --   --   --    < > = values in this interval not displayed.   Cardiac Enzymes: No results for input(s): CKTOTAL, CKMB, CKMBINDEX, TROPONINI in the last 168 hours. BNP: Invalid input(s): POCBNP CBG: No results for input(s): GLUCAP in the last 168 hours. D-Dimer No results for input(s): DDIMER in the last 72 hours. Hgb A1c No results for input(s): HGBA1C in the last 72 hours. Lipid Profile No results for input(s): CHOL, HDL, LDLCALC, TRIG, CHOLHDL, LDLDIRECT in the last 72 hours. Thyroid function studies No results for input(s): TSH, T4TOTAL, T3FREE, THYROIDAB in the last 72 hours.  Invalid input(s): FREET3 Anemia work up No results for input(s): VITAMINB12, FOLATE, FERRITIN, TIBC, IRON, RETICCTPCT in the last 72 hours. Urinalysis    Component Value Date/Time   COLORURINE YELLOW 07/31/2019 1436   APPEARANCEUR CLEAR 07/31/2019 1436   LABSPEC 1.015 07/31/2019 1436   PHURINE 5.0 07/31/2019 1436   GLUCOSEU NEGATIVE 07/31/2019 1436   HGBUR NEGATIVE 07/31/2019 1436   BILIRUBINUR NEGATIVE 07/31/2019 1436   KETONESUR NEGATIVE 07/31/2019 1436   PROTEINUR NEGATIVE 07/31/2019 1436   UROBILINOGEN 2.0 (H) 10/29/2009 2340   NITRITE POSITIVE (A) 07/31/2019 1436   LEUKOCYTESUR MODERATE (A) 07/31/2019 1436    Microbiology Recent Results (from the past 240 hour(s))  SARS CORONAVIRUS 2 (TAT 6-24 HRS) Nasopharyngeal Urine, Clean Catch     Status: None   Collection  Time: 07/31/19  3:53 PM   Specimen: Urine, Clean Catch; Nasopharyngeal  Result Value Ref Range Status   SARS Coronavirus 2 NEGATIVE NEGATIVE Final    Comment: (NOTE) SARS-CoV-2 target nucleic acids are NOT DETECTED. The SARS-CoV-2 RNA is generally detectable in upper and lower respiratory specimens during the acute phase of infection. Negative results do not preclude SARS-CoV-2 infection, do not rule out co-infections with other pathogens, and should not be used as the sole basis for treatment or other  patient management decisions. Negative results must be combined with clinical observations, patient history, and epidemiological information. The expected result is Negative. Fact Sheet for Patients: SugarRoll.be Fact Sheet for Healthcare Providers: https://www.woods-mathews.com/ This test is not yet approved or cleared by the [place] FDA and  has been authorized for detection and/or diagnosis of SARS-CoV-2 by FDA under an Emergency Use Authorization (EUA). This EUA will remain  in effect (meaning this test can be used) for the duration of the COVID-19 declaration under Section 56 4(b)(1) of the Act, 21 U.S.C. section 360bbb-3(b)(1), unless the authorization is terminated or revoked sooner. Performed at [facility], [place] [address], [place], [place] [address]   Culture, Urine     Status: Abnormal   Collection Time: 07/31/19  9:26 PM   Specimen: Urine, Clean Catch  Result Value Ref Range Status   Specimen Description   Final    URINE, CLEAN CATCH Performed at [facility], [address], [place], [place] [address]    Special Requests   Final    NONE Performed at [facility], [address], [place], [place] [address]    Culture (A)  Final    >=100,000 COLONIES/mL ESCHERICHIA COLI Confirmed Extended Spectrum Beta-Lactamase Producer (ESBL).  In bloodstream infections from ESBL organisms, carbapenems are preferred over  piperacillin/tazobactam. They are shown to have a lower risk of mortality.    Report Status 08/03/2019 FINAL  Final   Organism ID, Bacteria ESCHERICHIA COLI (A)  Final      Susceptibility   Escherichia coli - MIC*    AMPICILLIN >=32 RESISTANT Resistant     CEFAZOLIN >=64 RESISTANT Resistant     CEFTRIAXONE >=64 RESISTANT Resistant     CIPROFLOXACIN 1 SENSITIVE Sensitive     GENTAMICIN <=1 SENSITIVE Sensitive     IMIPENEM <=0.25 SENSITIVE Sensitive     NITROFURANTOIN <=16 SENSITIVE Sensitive     TRIMETH/SULFA <=20 SENSITIVE Sensitive     AMPICILLIN/SULBACTAM 8 SENSITIVE Sensitive     PIP/TAZO <=4 SENSITIVE Sensitive     * >=100,000 COLONIES/mL ESCHERICHIA COLI  SARS CORONAVIRUS 2 (TAT 6-24 HRS) Nasopharyngeal Nasopharyngeal Swab     Status: None   Collection Time: 08/02/19  3:22 PM   Specimen: Nasopharyngeal Swab  Result Value Ref Range Status   SARS Coronavirus 2 NEGATIVE NEGATIVE Final    Comment: (NOTE) SARS-CoV-2 target nucleic acids are NOT DETECTED. The SARS-CoV-2 RNA is generally detectable in upper and lower respiratory specimens during the acute phase of infection. Negative results do not preclude SARS-CoV-2 infection, do not rule out co-infections with other pathogens, and should not be used as the sole basis for treatment or other patient management decisions. Negative results must be combined with clinical observations, patient history, and epidemiological information. The expected result is Negative. Fact Sheet for Patients: SugarRoll.be Fact Sheet for Healthcare Providers: https://www.woods-mathews.com/ This test is not yet approved or cleared by the [place] FDA and  has been authorized for detection and/or diagnosis of SARS-CoV-2 by FDA under an Emergency Use Authorization (EUA). This EUA will remain  in effect (meaning this test can be used) for the duration of the COVID-19 declaration under Section 56 4(b)(1) of the  Act, 21 U.S.C. section 360bbb-3(b)(1), unless the authorization is terminated or revoked sooner. Performed at [facility], [place] [address], [place], [place] [address]     Time coordinating discharge: Approximately 40 minutes  Patrecia Pour, MD  [facility] 08/08/2019, 10:06 AM

## 2019-10-04 ENCOUNTER — Other Ambulatory Visit: Payer: Self-pay | Admitting: Family Medicine

## 2019-10-04 DIAGNOSIS — R531 Weakness: Secondary | ICD-10-CM

## 2019-10-04 DIAGNOSIS — R42 Dizziness and giddiness: Secondary | ICD-10-CM

## 2019-11-05 ENCOUNTER — Ambulatory Visit
Admission: RE | Admit: 2019-11-05 | Discharge: 2019-11-05 | Disposition: A | Discharge status: Home / Self Care | Payer: Medicare Other | Source: Ambulatory Visit | Attending: Family Medicine | Admitting: Family Medicine

## 2019-11-05 ENCOUNTER — Other Ambulatory Visit: Payer: Self-pay

## 2019-11-05 DIAGNOSIS — R531 Weakness: Secondary | ICD-10-CM

## 2019-11-05 DIAGNOSIS — R42 Dizziness and giddiness: Secondary | ICD-10-CM

## 2019-11-14 ENCOUNTER — Ambulatory Visit (INDEPENDENT_AMBULATORY_CARE_PROVIDER_SITE_OTHER): Payer: Medicare Other | Admitting: Neurology

## 2019-11-14 ENCOUNTER — Other Ambulatory Visit: Payer: Self-pay

## 2019-11-14 ENCOUNTER — Encounter: Payer: Self-pay | Admitting: Neurology

## 2019-11-14 VITALS — BP 102/56 | HR 92 | Ht 67.0 in | Wt 233.0 lb

## 2019-11-14 DIAGNOSIS — I951 Orthostatic hypotension: Secondary | ICD-10-CM

## 2019-11-14 NOTE — Progress Notes (Addendum)
Provider:  Larey Seat, M D  Referring Provider: Aletha Halim., PA-C Primary Care Physician:  Aletha Halim., PA-C  Chief Complaint  Patient presents with  . New Patient (Initial Visit)    pt alone, rm 10. presents today for new onset of dizziness that started after she was dc home from hospital. started end of feb and beginning of march.     HPI:  Stacey Monroe is a 59 y.o. female patient is seen here upon  a referral from [facility] . Deatra Ina for " dizziness, and abnormal MRI " .  Mrs. Stacey Monroe reports that she is sent by her primary care physician assistant to neurology for persistent dizziness "and an abnormal MRI "the provider states that on 10-01-2019 dizziness has been a recurrent problem the current episode started about a month ago he feels sometimes fatigued and weak, dizziness is aggravated by walking, standing and bending.  Treatment by nasal spray not further named what kind, and meclizine doses not mentioned, did not provide relief.  She was seen not long ago for a cardiac work-up in [facility] ( 3 times between January and February , related to liver cirrhosis) ,  Had 2 blood transfusions in hospital, she has chronic edema at both ankles, and dizziness began after her diuretics were doubled in dose-  she has not seen him again- she cut spironolactone down to one a day, lasix 40 mg twice a day.  Now -no chest pain shortness of breath.  She has hyponatremia she is taking furosemide and spironolactone  since 2012 and was told to follow a low-sodium diet but her last sodium was 130.  I would be much more concerned about hypokalemia. Dr Benson Norway, her [place] doctor, wanted her to watch potassium. Potassium was supplemented after being found low and she noted no improvement in dizziness or weakness.   She is not dizzy here today. She is never dizzy when she is at rest, in bed.  MRI review_ this patient is a smoker, heavy and long term and has cirrhosis- this is the  expected MRI for these conditions! White matter disease related to smoking, microvascular disorder. Brain atrophy is seen with hepatic and renal disease and with endocrine disorders.    Past Medical History:  Diagnosis Date  . Chronic diastolic heart failure ([place])   . Liver cirrhosis related to alcohol abuse 2011   . Hyperlipidemia   . Hypertension   . Low back pain   . edema   . Osteoarthritis    knees  . Pre-eclampsia   . COPD, tobacco abuse     Diuretic induced hypokalemia and hyponatremia.  .    . SOB (shortness of breath)       Stacey Monroe HPI: This is a 59 year old female with a PMH of ETOH cirrhosis, DM, hyperlipidemia, obesity, and HTN admitted for decompensated cirrhosis.  She started to have lower extremity swelling several weeks ago and it was initially unilateral.  Work up for a DVT was negative, but then she developed bilateral LE swelling.  Her weight increased from 235 lbs up to 260 lbs.  Over a one week time period, on a <2 gram sodium diet her weight continued to increase up by 4 lbs.  She was treated with Step II diuretics.  On this regimen her sodium declined, but she was not symptomatic.  Her lower extremities are painful as a result of the swelling.  Her HGB also dropped  from 14.1 g/dL the end of November 2020 down to 9./2 g/dL on 06/25/2019.  She denied any hematochezia, melena, diarrhea, or constipation. Her routine EGD in 2018 was significant for small esophageal varices and portal HTN.  Over the years the patient was recommended to undergo a colonoscopy, but she declined multiple times.  Her current HGB is now at 6.6 g/dL.    Social History   Socioeconomic History  . Marital status: Legally Separated    Spouse name: Not on file  . Number of children: Not on file  . Years of education: Not on file  . Highest education level: Not on file  Occupational History  . Not on file  Tobacco Use  . Smoking status: Current Every Day Smoker    Packs/day: 1.00     Years: 30.00    Pack years: 30.00    Types: Cigarettes  . Smokeless tobacco: Never Used  Substance and Sexual Activity  . Alcohol use: No  . Drug use: No  . Sexual activity: Yes    Comment: MARRIED  Other Topics Concern  . Not on file  Social History Narrative  . Not on file   Social Determinants of Health   Financial Resource Strain:   . Difficulty of Paying Living Expenses:   Food Insecurity:   . Worried About Charity fundraiser in the Last Year:   . Arboriculturist in the Last Year:   Transportation Needs:   . Film/video editor (Medical):   Marland Kitchen Lack of Transportation (Non-Medical):   Physical Activity:   . Days of Exercise per Week:   . Minutes of Exercise per Session:   Stress:   . Feeling of Stress :   Social Connections:   . Frequency of Communication with Friends and Family:   . Frequency of Social Gatherings with Friends and Family:   . Attends Religious Services:   . Active Member of Clubs or Organizations:   . Attends Archivist Meetings:   Marland Kitchen Marital Status:   Intimate Partner Violence:   . Fear of Current or Ex-Partner:   . Emotionally Abused:   Marland Kitchen Physically Abused:   . Sexually Abused:     Past Medical History:  Diagnosis Date  . Chronic diastolic heart failure ([place])   . Chronic musculoskeletal pain   . Hyperlipidemia   . Hypertension   . Low back pain   . OSA on CPAP   . Osteoarthritis    knees  . Pre-eclampsia   . Sleep apnea, obstructive    patient denies  . Slipped cervical disc   . SOB (shortness of breath)     Past Surgical History:  Procedure Laterality Date  . CESAREAN SECTION    . COLONOSCOPY WITH PROPOFOL N/A 07/06/2019   Procedure: COLONOSCOPY WITH PROPOFOL;  Surgeon: Carol Ada, MD;  Location: [facility];  Service: Endoscopy;  Laterality: N/A;  . ESOPHAGOGASTRODUODENOSCOPY (EGD) WITH PROPOFOL N/A 07/06/2019   Procedure: ESOPHAGOGASTRODUODENOSCOPY (EGD) WITH PROPOFOL;  Surgeon: Carol Ada, MD;  Location: [facility];  Service: Endoscopy;  Laterality: N/A;  . HOT HEMOSTASIS N/A 07/06/2019   Procedure: HOT HEMOSTASIS (ARGON PLASMA COAGULATION/BICAP);  Surgeon: Carol Ada, MD;  Location: [place];  Service: Endoscopy;  Laterality: N/A;  . POLYPECTOMY  07/06/2019   Procedure: POLYPECTOMY;  Surgeon: Carol Ada, MD;  Location: [facility];  Service: Endoscopy;;  . TUBAL LIGATION      Current Outpatient Medications  Medication Sig Dispense Refill  . acetaminophen (TYLENOL)  500 MG tablet Take 500 mg by mouth every 6 (six) hours as needed for mild pain, moderate pain or headache.    . furosemide (LASIX) 40 MG tablet Take 1 tablet (40 mg total) by mouth 2 (two) times daily. Call Carol Ada, MD for refills 60 tablet 0  . omeprazole (PRILOSEC) 10 MG capsule Take 10 mg by mouth daily.    . potassium chloride (KLOR-CON) 10 MEQ tablet Take 1 tablet by mouth daily.    Marland Kitchen spironolactone (ALDACTONE) 100 MG tablet Take 2 tablets (200 mg total) by mouth daily. (Patient taking differently: Take 100 mg by mouth daily. ) 60 tablet 0  . zolpidem (AMBIEN) 10 MG tablet Take 10 mg by mouth at bedtime.     No current facility-administered medications for this visit.    Allergies as of 11/14/2019 - Review Complete 11/14/2019  Allergen Reaction Noted  . Penicillin g Hives 03/22/2016    Vitals: BP (!) 102/56 (BP Location: Left Arm, Patient Position: Standing) Comment: slight dizziness/same as transition from supine to sit  Pulse 92   Ht 5\' 7"  (1.702 m)   Wt 233 lb (105.7 kg)   BMI 36.49 kg/m  Last Weight:  Wt Readings from Last 1 Encounters:  11/14/19 233 lb (105.7 kg)   Last Height:   Ht Readings from Last 1 Encounters:  11/14/19 5\' 7"  (1.702 m)    Orthostatic : Patient reports that she has never had orthostatic blood pressures taken at primary care.  Supine blood pressure was 138/69 with a heart rate of 83 regular.  Seated blood pressure 120/67 with a heart rate of 88 regular.  Standing 102/56  with a heart rate of 92 compensatory regular.  This is clearly orthostatic hypotension when the patient changes her posture she drops 18 mmHg and her systolic blood pressure.  Her heart rate can barely compensate for this.   Physical exam:  General: The patient is awake, alert and appears not in acute distress. The patient is cooperative  Head: Normocephalic, atraumatic. Neck is supple. Cardiovascular:  Regular rate and rhythm, without  murmurs or carotid bruit, and without distended neck veins. Respiratory: Lungs are clear to auscultation. Skin:  With ankle  evidence of edema,Trunk: BMI is 37 elevated and patient  has normal posture.  Neurologic exam : The patient is awake and alert, oriented to place and time.  Memory subjective  described as intact. There is a normal attention span & concentration ability. Speech is fluent  With dysphonia , not  aphasia. Mood and affect are appropriate.  Cranial nerves: Pupils are equal and briskly reactive to light. Funduscopic exam without evidence of pallor or edema. Extraocular movements  in vertical and horizontal planes intact and without nystagmus.  Visual fields by finger perimetry are intact. Hearing to finger rub intact.  Facial sensation intact to fine touch. Facial motor strength is symmetric and tongue and uvula move midline. Tongue protrusion into either cheek is normal. Shoulder shrug is normal.   Motor exam:   Normal tone ,muscle bulk and symmetric  strength in all extremities.  Sensory:  Fine touch, pinprick and vibration were tested in all extremities.  Proprioception was normal.  Coordination: reports changes in handwriting.  Rapid alternating movements in the fingers/hands were normal. Finger-to-nose maneuver  normal without evidence of ataxia, dysmetria . Gait and station: Patient walks with a walker as assistive device but  is able unassisted to climb up to the exam table. Strength within normal limits. Stance is unstable related  to  orthostatics.  Deep tendon reflexes: in the upper and lower extremities are symmetric and intact.  Assessment:  46 minutes-  After physical and neurologic examination, review of laboratory studies, imaging, neurophysiology testing and pre-existing records, assessment is that of :   Patient has orthostatic drop in systolic blood pressure , dizziness is related to this.   MRI is nomal for a smoker with history of hepatic encephalopathy in 2011- cirrhosis..   Plan:  Treatment plan and additional workup :  YOU NEED A PRIMARY INTERNIST PHYSICIAN. I will ask you to see Dr Benson Norway again to help with electrolytes.  I recommend no special neurologic therapy. Please wear compression stockings.  Primary goal is to stabilize diastolic output and hepatic function, balance electrolytes.    Asencion Partridge Adely Facer MD 11/14/2019

## 2019-11-14 NOTE — Patient Instructions (Signed)

## 2019-11-16 ENCOUNTER — Telehealth: Payer: Self-pay | Admitting: Family Medicine

## 2019-11-16 NOTE — Telephone Encounter (Signed)
A patient called to reschedule her new patient appointment and I called this patient to give her this one that opened up on May 27 at 3 PM  Nothing further needed

## 2019-11-16 NOTE — Telephone Encounter (Signed)
The patient was just seen by her neurologist and was diagnosed with orthostatic hypotension and was suggested that she establish care with an internalist. I have the patient scheduled with Jerilee Hoh on July 23 at 11 AM. She really needs to be seen earlier and would like to see if you would be able to work her in.  Please advise

## 2019-11-21 ENCOUNTER — Other Ambulatory Visit: Payer: Self-pay

## 2019-11-22 ENCOUNTER — Encounter: Payer: Self-pay | Admitting: Internal Medicine

## 2019-11-22 ENCOUNTER — Ambulatory Visit (INDEPENDENT_AMBULATORY_CARE_PROVIDER_SITE_OTHER): Payer: Medicare Other | Admitting: Internal Medicine

## 2019-11-22 VITALS — Temp 97.9°F | Ht 67.0 in | Wt 230.6 lb

## 2019-11-22 DIAGNOSIS — I951 Orthostatic hypotension: Secondary | ICD-10-CM

## 2019-11-22 DIAGNOSIS — R918 Other nonspecific abnormal finding of lung field: Secondary | ICD-10-CM

## 2019-11-22 DIAGNOSIS — R42 Dizziness and giddiness: Secondary | ICD-10-CM

## 2019-11-22 DIAGNOSIS — Z1231 Encounter for screening mammogram for malignant neoplasm of breast: Secondary | ICD-10-CM

## 2019-11-22 DIAGNOSIS — R600 Localized edema: Secondary | ICD-10-CM

## 2019-11-22 DIAGNOSIS — K7030 Alcoholic cirrhosis of liver without ascites: Secondary | ICD-10-CM | POA: Diagnosis not present

## 2019-11-22 MED ORDER — FUROSEMIDE 20 MG PO TABS: 60.0000 mg | ORAL_TABLET | Freq: Every day | ORAL | 1 refills | Status: AC

## 2019-11-22 NOTE — Progress Notes (Signed)
New Patient Office Visit     This visit occurred during the SARS-CoV-2 public health emergency.  Safety protocols were in place, including screening questions prior to the visit, additional usage of staff PPE, and extensive cleaning of exam room while observing appropriate contact time as indicated for disinfecting solutions.    CC/Reason for Visit: Establish care, discuss chronic conditions and some acute concerns Previous PCP: Bing Matter, PA Last Visit: February 2021  HPI: HARJOT ZAVADIL is a 59 y.o. female who is coming in today for the above mentioned reasons. Past Medical History is significant for: Alcoholic cirrhosis of the liver followed by Dr. Carol Ada, anemia, hyponatremia, hypokalemia.  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  During that same hospitalization she was admitted for edema and hypervolemia and her Lasix dose was increased to 40 mg 2 tablets daily and spironolactone was increased to 200 mg daily.  She states that ever since then she has been dizzy.  Her prior PCP ordered an MRI that was negative and referred her to neurology.  While at neurology she was found to have orthostatic hypotension and was referred to me.  She is afraid that she will fall at times.  She states that her dizziness gets worse if she is hot or outside.  She decided to decrease her spironolactone dose on her own to 100 mg.   Past Medical/Surgical History: Past Medical History:  Diagnosis Date  . Chronic diastolic heart failure ([place])   . Chronic musculoskeletal pain   . Hyperlipidemia   . Hypertension   . Low back pain   . OSA on CPAP   . Osteoarthritis    knees  . Pre-eclampsia   . Sleep apnea, obstructive    patient denies  . Slipped cervical disc   . SOB (shortness of breath)     Past Surgical History:  Procedure Laterality Date  . CESAREAN SECTION    . COLONOSCOPY WITH PROPOFOL N/A 07/06/2019   Procedure:  COLONOSCOPY WITH PROPOFOL;  Surgeon: Carol Ada, MD;  Location: [facility];  Service: Endoscopy;  Laterality: N/A;  . ESOPHAGOGASTRODUODENOSCOPY (EGD) WITH PROPOFOL N/A 07/06/2019   Procedure: ESOPHAGOGASTRODUODENOSCOPY (EGD) WITH PROPOFOL;  Surgeon: Carol Ada, MD;  Location: [facility];  Service: Endoscopy;  Laterality: N/A;  . HOT HEMOSTASIS N/A 07/06/2019   Procedure: HOT HEMOSTASIS (ARGON PLASMA COAGULATION/BICAP);  Surgeon: Carol Ada, MD;  Location: [facility];  Service: Endoscopy;  Laterality: N/A;  . POLYPECTOMY  07/06/2019   Procedure: POLYPECTOMY;  Surgeon: Carol Ada, MD;  Location[facility] ENDOSCOPY;  Service: Endoscopy;;  . TUBAL LIGATION      Social History:  reports that she has been smoking cigarettes. She has a 30.00 pack-year smoking history. She has never used smokeless tobacco. She reports that she does not drink alcohol or use drugs.  Allergies: Allergies  Allergen Reactions  . Penicillin G Hives    Did it involve swelling of the face/tongue/throat, SOB, or low BP?No Did it involve sudden or severe rash/hives, skin peeling, or any reaction on the inside of your mouth or nose? Yes Did you need to seek medical attention at a hospital or doctor's office? No When did it last happen?Child If all above answers are "NO", may proceed with cephalosporin use.    Family History:  No history of heart disease, stroke, cancer that she is aware of.  Current Outpatient Medications:  .  acetaminophen (TYLENOL) 500 MG  tablet, Take 500 mg by mouth every 6 (six) hours as needed for mild pain, moderate pain or headache., Disp: , Rfl:  .  furosemide (LASIX) 40 MG tablet, Take 1 tablet (40 mg total) by mouth 2 (two) times daily. Call Carol Ada, MD for refills, Disp: 60 tablet, Rfl: 0 .  omeprazole (PRILOSEC) 10 MG capsule, Take 10 mg by mouth daily., Disp: , Rfl:  .  potassium chloride (KLOR-CON) 10 MEQ tablet, Take 1 tablet by mouth daily., Disp: , Rfl:  .   spironolactone (ALDACTONE) 100 MG tablet, Take 2 tablets (200 mg total) by mouth daily. (Patient taking differently: Take 100 mg by mouth daily. ), Disp: 60 tablet, Rfl: 0 .  zolpidem (AMBIEN) 10 MG tablet, Take 10 mg by mouth at bedtime., Disp: , Rfl:  .  diazepam (VALIUM) 10 MG tablet, Take 10 mg by mouth daily as needed., Disp: , Rfl:   Review of Systems:  Constitutional: Denies fever, chills, diaphoresis, appetite change.  HEENT: Denies photophobia, eye pain, redness, hearing loss, ear pain, congestion, sore throat, rhinorrhea, sneezing, mouth sores, trouble swallowing, neck pain, neck stiffness and tinnitus.   Respiratory: Denies SOB, DOE, cough, chest tightness,  and wheezing.   Cardiovascular: Denies chest pain, palpitations. Gastrointestinal: Denies nausea, vomiting, abdominal pain, diarrhea, constipation, blood in stool and abdominal distention.  Genitourinary: Denies dysuria, urgency, frequency, hematuria, flank pain and difficulty urinating.  Endocrine: Denies: hot or cold intolerance, sweats, changes in hair or nails, polyuria, polydipsia. Musculoskeletal: Denies myalgias, back pain, joint swelling, arthralgias and gait problem.  Skin: Denies pallor, rash and wound.  Neurological: Denies  seizures, syncope, numbness and headaches.  Hematological: Denies adenopathy. Easy bruising, personal or family bleeding history  Psychiatric/Behavioral: Denies suicidal ideation, mood changes, confusion, nervousness, sleep disturbance and agitation    Physical Exam: Vitals:   11/22/19 1502  BP: 110/70  Pulse: 94  Temp: 97.9 F (36.6 C)  TempSrc: Temporal  SpO2: 98%  Weight: 230 lb 9.6 oz (104.6 kg)  Height: 5\' 7"  (1.702 m)   Body mass index is 36.12 kg/m.  Constitutional: NAD, calm, comfortable Eyes: PERRL, lids and conjunctivae normal ENMT: Mucous membranes are moist.  Respiratory: clear to auscultation bilaterally, no wheezing, no crackles. Normal respiratory effort. No accessory  muscle use.  Cardiovascular: Regular rate and rhythm, no murmurs / rubs / gallops.  2-3+ pitting lower extremity edema. Abdomen: no tenderness, no masses palpated. Bowel sounds positive.  Psychiatric: Normal judgment and insight. Alert and oriented x 3. Normal mood.    Impression and Plan:  Dizziness  Orthostatic hypotension  Alcoholic cirrhosis of liver without ascites (HCC) Bilateral lower extremity edema  -Suspect dizziness due to orthostatic hypotension. -I will go ahead and decrease Lasix from 80 mg daily to 60 mg daily, she is instructed to keep spironolactone as she is at 100 mg daily which is down from the initial 200 prescribed. -She will keep an ambulatory blood pressure log and contact me if she continues to have issues. -Have advised close follow-up with Dr. Benson Norway as well. -Have advised use of compression stockings might be helpful for her as well.   Screening mammogram, encounter for  - Plan: MM Digital Screening.   Patient Instructions  -Nice seeing you today!!  -Lab work today; will notify you once results are available.  -Schedule follow up in 3 months for your physical. Please come in fasting that day.     Lelon Frohlich, MD [facility] at [facility]

## 2019-11-22 NOTE — Patient Instructions (Addendum)
-  Nice seeing you today!!  -Lab work today; will notify you once results are available.   -Continue to take spironolactone 100 mg daily and decrease lasix to 60 mg daily instead of 80 mg.  -Schedule follow up in 3 months for your physical. Please come in fasting that day.

## 2019-11-23 ENCOUNTER — Encounter: Payer: Self-pay | Admitting: Internal Medicine

## 2019-11-23 ENCOUNTER — Other Ambulatory Visit: Payer: Self-pay | Admitting: Internal Medicine

## 2019-11-23 DIAGNOSIS — E559 Vitamin D deficiency, unspecified: Secondary | ICD-10-CM

## 2019-11-23 LAB — CBC WITH DIFFERENTIAL/PLATELET
Basophils Absolute: 0.1 10*3/uL (ref 0.0–0.1)
Basophils Relative: 1.3 % (ref 0.0–3.0)
Eosinophils Absolute: 0.1 10*3/uL (ref 0.0–0.7)
Eosinophils Relative: 1.2 % (ref 0.0–5.0)
HCT: 34.9 % — ABNORMAL LOW (ref 36.0–46.0)
Hemoglobin: 11.9 g/dL — ABNORMAL LOW (ref 12.0–15.0)
Lymphocytes Relative: 26.6 % (ref 12.0–46.0)
Lymphs Abs: 1.5 10*3/uL (ref 0.7–4.0)
MCHC: 34.1 g/dL (ref 30.0–36.0)
MCV: 93.8 fl (ref 78.0–100.0)
Monocytes Absolute: 0.8 10*3/uL (ref 0.1–1.0)
Monocytes Relative: 14.7 % — ABNORMAL HIGH (ref 3.0–12.0)
Neutro Abs: 3.1 10*3/uL (ref 1.4–7.7)
Neutrophils Relative %: 56.2 % (ref 43.0–77.0)
Platelets: 186 10*3/uL (ref 150.0–400.0)
RBC: 3.71 Mil/uL — ABNORMAL LOW (ref 3.87–5.11)
RDW: 14.7 % (ref 11.5–15.5)
WBC: 5.5 10*3/uL (ref 4.0–10.5)

## 2019-11-23 LAB — COMPREHENSIVE METABOLIC PANEL
ALT: 15 U/L (ref 0–35)
AST: 42 U/L — ABNORMAL HIGH (ref 0–37)
Albumin: 3.1 g/dL — ABNORMAL LOW (ref 3.5–5.2)
Alkaline Phosphatase: 138 U/L — ABNORMAL HIGH (ref 39–117)
BUN: 7 mg/dL (ref 6–23)
CO2: 29 mEq/L (ref 19–32)
Calcium: 8.9 mg/dL (ref 8.4–10.5)
Chloride: 90 mEq/L — ABNORMAL LOW (ref 96–112)
Creatinine, Ser: 0.8 mg/dL (ref 0.40–1.20)
GFR: 73.24 mL/min (ref >60.00)
Glucose, Bld: 84 mg/dL (ref 70–99)
Potassium: 3.7 mEq/L (ref 3.5–5.1)
Sodium: 129 mEq/L — ABNORMAL LOW (ref 135–145)
Total Bilirubin: 2.7 mg/dL — ABNORMAL HIGH (ref 0.2–1.2)
Total Protein: 7.6 g/dL (ref 6.0–8.3)

## 2019-11-23 LAB — VITAMIN B12: Vitamin B-12: 804 pg/mL (ref 211–911)

## 2019-11-23 LAB — TSH: TSH: 1.69 u[IU]/mL (ref 0.35–4.50)

## 2019-11-23 LAB — VITAMIN D 25 HYDROXY (VIT D DEFICIENCY, FRACTURES): VITD: 15.33 ng/mL — ABNORMAL LOW (ref 30.00–100.00)

## 2019-11-23 MED ORDER — VITAMIN D (ERGOCALCIFEROL) 1.25 MG (50000 UNIT) PO CAPS: 50000.0000 [IU] | ORAL_CAPSULE | ORAL | 0 refills | Status: AC

## 2019-11-27 ENCOUNTER — Other Ambulatory Visit: Payer: Self-pay | Admitting: Internal Medicine

## 2019-11-27 DIAGNOSIS — E559 Vitamin D deficiency, unspecified: Secondary | ICD-10-CM

## 2020-01-18 ENCOUNTER — Ambulatory Visit: Payer: Medicare Other | Admitting: Internal Medicine

## 2020-02-26 ENCOUNTER — Other Ambulatory Visit: Payer: Self-pay | Admitting: Internal Medicine

## 2020-02-26 NOTE — Telephone Encounter (Signed)
zolpidem (AMBIEN) 10 MG tablet  [facility] [address] [place] Phone:  [phone]  Fax:  [phone]

## 2020-02-27 NOTE — Telephone Encounter (Signed)
Patient is scheduled for a telephone visit 09/02 at 4

## 2020-02-28 ENCOUNTER — Encounter: Payer: Medicare Other | Admitting: Internal Medicine

## 2020-02-28 ENCOUNTER — Encounter: Payer: Self-pay | Admitting: Internal Medicine

## 2020-02-29 ENCOUNTER — Other Ambulatory Visit: Payer: Self-pay

## 2020-02-29 ENCOUNTER — Telehealth (INDEPENDENT_AMBULATORY_CARE_PROVIDER_SITE_OTHER): Payer: Medicare Other | Admitting: Internal Medicine

## 2020-02-29 DIAGNOSIS — Z79899 Other long term (current) drug therapy: Secondary | ICD-10-CM | POA: Diagnosis not present

## 2020-02-29 DIAGNOSIS — G4700 Insomnia, unspecified: Secondary | ICD-10-CM | POA: Diagnosis not present

## 2020-02-29 MED ORDER — ZOLPIDEM TARTRATE 10 MG PO TABS: 10.0000 mg | ORAL_TABLET | Freq: Every day | ORAL | 2 refills | Status: AC

## 2020-02-29 NOTE — Progress Notes (Signed)
Virtual Visit via Telephone Note  I connected with Stacey Monroe on 02/29/20 at  1:30 PM EDT by telephone and verified that I am speaking with the correct person using two identifiers.   I discussed the limitations, risks, security and privacy concerns of performing an evaluation and management service by telephone and the availability of in person appointments. I also discussed with the patient that there may be a patient responsible charge related to this service. The patient expressed understanding and agreed to proceed.  Location patient: home Location provider: work office Participants present for the call: patient, provider Patient did not have a visit in the prior 7 days to address this/these issue(s).   History of Present Illness:  This visit has been scheduled for ambien refills. She has been taking ambien 10 mg qhs for many years (PDMP confirms). She states it has not been refilled in a week and she has been having tremendous difficulty sleeping because of that. She states dizziness has improved since reduction of diuretic dosing and she has not had excess accumulation of fluid.   Observations/Objective: Patient sounds cheerful and well on the phone. I do not appreciate any increased work of breathing. Speech and thought processing are grossly intact. Patient reported vitals: none reported   Current Outpatient Medications:  .  acetaminophen (TYLENOL) 500 MG tablet, Take 500 mg by mouth every 6 (six) hours as needed for mild pain, moderate pain or headache., Disp: , Rfl:  .  diazepam (VALIUM) 10 MG tablet, Take 10 mg by mouth daily as needed., Disp: , Rfl:  .  furosemide (LASIX) 20 MG tablet, Take 3 tablets (60 mg total) by mouth daily. Call Carol Ada, MD for refills, Disp: 270 tablet, Rfl: 1 .  omeprazole (PRILOSEC) 10 MG capsule, Take 10 mg by mouth daily., Disp: , Rfl:  .  potassium chloride (KLOR-CON) 10 MEQ tablet, Take 1 tablet by mouth daily., Disp: , Rfl:  .   spironolactone (ALDACTONE) 100 MG tablet, Take 2 tablets (200 mg total) by mouth daily. (Patient taking differently: Take 100 mg by mouth daily. ), Disp: 60 tablet, Rfl: 0 .  zolpidem (AMBIEN) 10 MG tablet, Take 1 tablet (10 mg total) by mouth at bedtime., Disp: 30 tablet, Rfl: 2  Review of Systems:  Constitutional: Denies fever, chills, diaphoresis, appetite change and fatigue.  HEENT: Denies photophobia, eye pain, redness, hearing loss, ear pain, congestion, sore throat, rhinorrhea, sneezing, mouth sores, trouble swallowing, neck pain, neck stiffness and tinnitus.   Respiratory: Denies SOB, DOE, cough, chest tightness,  and wheezing.   Cardiovascular: Denies chest pain, palpitations and leg swelling.  Gastrointestinal: Denies nausea, vomiting, abdominal pain, diarrhea, constipation, blood in stool and abdominal distention.  Genitourinary: Denies dysuria, urgency, frequency, hematuria, flank pain and difficulty urinating.  Endocrine: Denies: hot or cold intolerance, sweats, changes in hair or nails, polyuria, polydipsia. Musculoskeletal: Denies myalgias, back pain, joint swelling, arthralgias and gait problem.  Skin: Denies pallor, rash and wound.  Neurological: Denies dizziness, seizures, syncope, weakness, light-headedness, numbness and headaches.  Hematological: Denies adenopathy. Easy bruising, personal or family bleeding history  Psychiatric/Behavioral: Denies suicidal ideation, mood changes, confusion, nervousness, sleep disturbance and agitation   Assessment and Plan:  Insomnia, unspecified type High risk medication use  -PDMP reviewed, no red flags, ORS 80. -Refill ambien 10 mg to take qhs #30 with 2 refills.    I discussed the assessment and treatment plan with the patient. The patient was provided an opportunity to ask questions  and all were answered. The patient agreed with the plan and demonstrated an understanding of the instructions.   The patient was advised to call  back or seek an in-person evaluation if the symptoms worsen or if the condition fails to improve as anticipated.  I provided 14 minutes of non-face-to-face time during this encounter.   Lelon Frohlich, MD [facility] at [facility]

## 2020-04-01 ENCOUNTER — Other Ambulatory Visit: Payer: Self-pay

## 2020-04-01 ENCOUNTER — Telehealth (INDEPENDENT_AMBULATORY_CARE_PROVIDER_SITE_OTHER): Payer: Medicare Other | Admitting: Internal Medicine

## 2020-04-01 DIAGNOSIS — R5383 Other fatigue: Secondary | ICD-10-CM

## 2020-04-01 DIAGNOSIS — R0602 Shortness of breath: Secondary | ICD-10-CM

## 2020-04-01 NOTE — Progress Notes (Signed)
Virtual Visit via Telephone Note  I connected with Stacey Monroe on 04/01/20 at  3:45 PM EDT by telephone and verified that I am speaking with the correct person using two identifiers.   I discussed the limitations, risks, security and privacy concerns of performing an evaluation and management service by telephone and the availability of in person appointments. I also discussed with the patient that there may be a patient responsible charge related to this service. The patient expressed understanding and agreed to proceed.  Location patient: home Location provider: work office Participants present for the call: patient, provider Patient did not have a visit in the prior 7 days to address this/these issue(s).   History of Present Illness:  She has scheduled this visit to discuss fatigue, shortness of breath and generalized weakness that have been present for months but have gotten progressively worse over the past 2 weeks.  She does have a history of alcoholic cirrhosis with frequent episodes of hypervolemia, hyponatremia.  She also has a history of hypokalemia.  She has not had labs checked since May, at that time her sodium level was 129 which is around her baseline.  She was also found during her hospitalization in April to have a cavitary lung mass and a repeat chest CT was recommended which she never followed up for.  She tells me that she has been getting progressively more shortness of breath to where it is not difficult to ambulate from her room into her bathroom.  She at times feels a little dizzy and lightheaded.   Observations/Objective: Patient sounds acutely ill on the phone. She is noted to be short of breath and cannot speak in full sentences. Speech and thought processing are grossly intact. Patient reported vitals: None reported   Current Outpatient Medications:  .  acetaminophen (TYLENOL) 500 MG tablet, Take 500 mg by mouth every 6 (six) hours as needed for mild  pain, moderate pain or headache., Disp: , Rfl:  .  diazepam (VALIUM) 10 MG tablet, Take 10 mg by mouth daily as needed., Disp: , Rfl:  .  furosemide (LASIX) 20 MG tablet, Take 3 tablets (60 mg total) by mouth daily. Call Carol Ada, MD for refills, Disp: 270 tablet, Rfl: 1 .  omeprazole (PRILOSEC) 10 MG capsule, Take 10 mg by mouth daily., Disp: , Rfl:  .  potassium chloride (KLOR-CON) 10 MEQ tablet, Take 1 tablet by mouth daily., Disp: , Rfl:  .  spironolactone (ALDACTONE) 100 MG tablet, Take 2 tablets (200 mg total) by mouth daily. (Patient taking differently: Take 100 mg by mouth daily. ), Disp: 60 tablet, Rfl: 0 .  zolpidem (AMBIEN) 10 MG tablet, Take 1 tablet (10 mg total) by mouth at bedtime., Disp: 30 tablet, Rfl: 2  Review of Systems:  Constitutional: Denies fever, chills, diaphoresis, appetite change. HEENT: Denies photophobia, eye pain, redness, hearing loss, ear pain, congestion, sore throat, rhinorrhea, sneezing, mouth sores, trouble swallowing, neck pain, neck stiffness and tinnitus.   Respiratory: Denies  cough, chest tightness,  and wheezing.   Cardiovascular: Denies chest pain, palpitations and leg swelling.  Gastrointestinal: Denies nausea, vomiting, abdominal pain, diarrhea, constipation, blood in stool and abdominal distention.  Genitourinary: Denies dysuria, urgency, frequency, hematuria, flank pain and difficulty urinating.  Endocrine: Denies: hot or cold intolerance, sweats, changes in hair or nails, polyuria, polydipsia. Musculoskeletal: Denies myalgias, back pain, joint swelling, arthralgias and gait problem.  Skin: Denies pallor, rash and wound.  Neurological: Denies  seizures, syncope,numbness and headaches.  Hematological: Denies adenopathy. Easy bruising, personal or family bleeding history  Psychiatric/Behavioral: Denies suicidal ideation, mood changes, confusion, nervousness, sleep disturbance and agitation   Assessment and Plan:  Fatigue, unspecified type  SOB (shortness of breath) -I am very concerned about her presentation today.  Most likely etiology appears to be decompensated cirrhosis with possibly hypervolemia and hyponatremia. -I cannot exclude an infectious source such as pneumonia or even COVID-19 infection given her shortness of breath (she is fully vaccinated). -It is also important to note that she had a cavitary lung lesion on CT scan back in April that was thought to be either a cavitary neoplasm or an atypical infection, however she never followed through with repeat chest CT as recommended by radiology. -Very limited assessment that I can do over a phone visit. -Given her progressive, severe symptoms I have recommended ED evaluation tonight.    I discussed the assessment and treatment plan with the patient. The patient was provided an opportunity to ask questions and all were answered. The patient agreed with the plan and demonstrated an understanding of the instructions.   The patient was advised to call back or seek an in-person evaluation if the symptoms worsen or if the condition fails to improve as anticipated.  I provided 22 minutes of non-face-to-face time during this encounter.   Lelon Frohlich, MD [facility]

## 2020-04-02 ENCOUNTER — Other Ambulatory Visit: Payer: Self-pay

## 2020-04-02 ENCOUNTER — Encounter (HOSPITAL_COMMUNITY): Payer: Self-pay | Admitting: Emergency Medicine

## 2020-04-02 ENCOUNTER — Emergency Department (HOSPITAL_COMMUNITY): Payer: Medicare Other

## 2020-04-02 ENCOUNTER — Inpatient Hospital Stay (HOSPITAL_COMMUNITY)
Admission: EM | Admit: 2020-04-02 | Discharge: 2020-04-05 | DRG: 433 | Discharge status: Against Medical Advice | Payer: Medicare Other | Attending: Internal Medicine | Admitting: Internal Medicine

## 2020-04-02 DIAGNOSIS — D696 Thrombocytopenia, unspecified: Secondary | ICD-10-CM | POA: Diagnosis present

## 2020-04-02 DIAGNOSIS — F1011 Alcohol abuse, in remission: Secondary | ICD-10-CM | POA: Diagnosis present

## 2020-04-02 DIAGNOSIS — C3431 Malignant neoplasm of lower lobe, right bronchus or lung: Secondary | ICD-10-CM | POA: Diagnosis present

## 2020-04-02 DIAGNOSIS — I110 Hypertensive heart disease with heart failure: Secondary | ICD-10-CM | POA: Diagnosis present

## 2020-04-02 DIAGNOSIS — R531 Weakness: Secondary | ICD-10-CM | POA: Diagnosis present

## 2020-04-02 DIAGNOSIS — D649 Anemia, unspecified: Secondary | ICD-10-CM | POA: Diagnosis not present

## 2020-04-02 DIAGNOSIS — K8020 Calculus of gallbladder without cholecystitis without obstruction: Secondary | ICD-10-CM | POA: Diagnosis present

## 2020-04-02 DIAGNOSIS — K7290 Hepatic failure, unspecified without coma: Secondary | ICD-10-CM | POA: Diagnosis present

## 2020-04-02 DIAGNOSIS — G4733 Obstructive sleep apnea (adult) (pediatric): Secondary | ICD-10-CM | POA: Diagnosis present

## 2020-04-02 DIAGNOSIS — R7989 Other specified abnormal findings of blood chemistry: Secondary | ICD-10-CM | POA: Diagnosis not present

## 2020-04-02 DIAGNOSIS — R0602 Shortness of breath: Secondary | ICD-10-CM

## 2020-04-02 DIAGNOSIS — E872 Acidosis, unspecified: Secondary | ICD-10-CM

## 2020-04-02 DIAGNOSIS — R630 Anorexia: Secondary | ICD-10-CM | POA: Diagnosis present

## 2020-04-02 DIAGNOSIS — K5900 Constipation, unspecified: Secondary | ICD-10-CM | POA: Diagnosis present

## 2020-04-02 DIAGNOSIS — Z6834 Body mass index (BMI) 34.0-34.9, adult: Secondary | ICD-10-CM

## 2020-04-02 DIAGNOSIS — R918 Other nonspecific abnormal finding of lung field: Secondary | ICD-10-CM | POA: Diagnosis not present

## 2020-04-02 DIAGNOSIS — E871 Hypo-osmolality and hyponatremia: Secondary | ICD-10-CM | POA: Diagnosis present

## 2020-04-02 DIAGNOSIS — Z5329 Procedure and treatment not carried out because of patient's decision for other reasons: Secondary | ICD-10-CM | POA: Diagnosis not present

## 2020-04-02 DIAGNOSIS — F17210 Nicotine dependence, cigarettes, uncomplicated: Secondary | ICD-10-CM | POA: Diagnosis present

## 2020-04-02 DIAGNOSIS — K7031 Alcoholic cirrhosis of liver with ascites: Secondary | ICD-10-CM | POA: Diagnosis present

## 2020-04-02 DIAGNOSIS — E876 Hypokalemia: Secondary | ICD-10-CM | POA: Diagnosis present

## 2020-04-02 DIAGNOSIS — Z880 Allergy status to penicillin: Secondary | ICD-10-CM

## 2020-04-02 DIAGNOSIS — R0902 Hypoxemia: Secondary | ICD-10-CM | POA: Diagnosis not present

## 2020-04-02 DIAGNOSIS — D509 Iron deficiency anemia, unspecified: Secondary | ICD-10-CM | POA: Diagnosis present

## 2020-04-02 DIAGNOSIS — I5032 Chronic diastolic (congestive) heart failure: Secondary | ICD-10-CM | POA: Diagnosis present

## 2020-04-02 DIAGNOSIS — E8809 Other disorders of plasma-protein metabolism, not elsewhere classified: Secondary | ICD-10-CM | POA: Diagnosis present

## 2020-04-02 DIAGNOSIS — Z79899 Other long term (current) drug therapy: Secondary | ICD-10-CM

## 2020-04-02 DIAGNOSIS — K7030 Alcoholic cirrhosis of liver without ascites: Secondary | ICD-10-CM

## 2020-04-02 DIAGNOSIS — D7589 Other specified diseases of blood and blood-forming organs: Secondary | ICD-10-CM | POA: Diagnosis present

## 2020-04-02 DIAGNOSIS — Z8719 Personal history of other diseases of the digestive system: Secondary | ICD-10-CM

## 2020-04-02 DIAGNOSIS — E861 Hypovolemia: Secondary | ICD-10-CM | POA: Diagnosis present

## 2020-04-02 DIAGNOSIS — Z20822 Contact with and (suspected) exposure to covid-19: Secondary | ICD-10-CM | POA: Diagnosis present

## 2020-04-02 DIAGNOSIS — J984 Other disorders of lung: Secondary | ICD-10-CM | POA: Diagnosis not present

## 2020-04-02 DIAGNOSIS — I959 Hypotension, unspecified: Secondary | ICD-10-CM | POA: Diagnosis present

## 2020-04-02 DIAGNOSIS — D689 Coagulation defect, unspecified: Secondary | ICD-10-CM | POA: Diagnosis present

## 2020-04-02 DIAGNOSIS — E669 Obesity, unspecified: Secondary | ICD-10-CM | POA: Diagnosis present

## 2020-04-02 DIAGNOSIS — R17 Unspecified jaundice: Secondary | ICD-10-CM

## 2020-04-02 LAB — SODIUM: Sodium: 121 mmol/L — ABNORMAL LOW (ref 135–145)

## 2020-04-02 LAB — CBG MONITORING, ED: Glucose-Capillary: 99 mg/dL (ref 70–99)

## 2020-04-02 LAB — COMPREHENSIVE METABOLIC PANEL
ALT: 45 U/L — ABNORMAL HIGH (ref 0–44)
AST: 161 U/L — ABNORMAL HIGH (ref 15–41)
Albumin: 2.3 g/dL — ABNORMAL LOW (ref 3.5–5.0)
Alkaline Phosphatase: 130 U/L — ABNORMAL HIGH (ref 38–126)
Anion gap: 19 — ABNORMAL HIGH (ref 5–15)
BUN: 5 mg/dL — ABNORMAL LOW (ref 6–20)
CO2: 26 mmol/L (ref 22–32)
Calcium: 8.6 mg/dL — ABNORMAL LOW (ref 8.9–10.3)
Chloride: 76 mmol/L — ABNORMAL LOW (ref 98–111)
Creatinine, Ser: 0.97 mg/dL (ref 0.44–1.00)
GFR calc non Af Amer: >60 mL/min (ref >60)
Glucose, Bld: 100 mg/dL — ABNORMAL HIGH (ref 70–99)
Potassium: 3.7 mmol/L (ref 3.5–5.1)
Sodium: 121 mmol/L — ABNORMAL LOW (ref 135–145)
Total Bilirubin: 9.7 mg/dL — ABNORMAL HIGH (ref 0.3–1.2)
Total Protein: 7.9 g/dL (ref 6.5–8.1)

## 2020-04-02 LAB — URINALYSIS, ROUTINE W REFLEX MICROSCOPIC
Glucose, UA: NEGATIVE mg/dL
Hgb urine dipstick: NEGATIVE
Ketones, ur: NEGATIVE mg/dL
Nitrite: POSITIVE — AB
Protein, ur: NEGATIVE mg/dL
Specific Gravity, Urine: 1.04 — ABNORMAL HIGH (ref 1.005–1.030)
WBC, UA: >50 WBC/hpf — ABNORMAL HIGH (ref 0–5)
pH: 5 (ref 5.0–8.0)

## 2020-04-02 LAB — CBC
HCT: 25.5 % — ABNORMAL LOW (ref 36.0–46.0)
Hemoglobin: 8.9 g/dL — ABNORMAL LOW (ref 12.0–15.0)
MCH: 39.4 pg — ABNORMAL HIGH (ref 26.0–34.0)
MCHC: 34.9 g/dL (ref 30.0–36.0)
MCV: 112.8 fL — ABNORMAL HIGH (ref 80.0–100.0)
Platelets: UNDETERMINED 10*3/uL (ref 150–400)
RBC: 2.26 MIL/uL — ABNORMAL LOW (ref 3.87–5.11)
RDW: 20.8 % — ABNORMAL HIGH (ref 11.5–15.5)
WBC: 5.5 10*3/uL (ref 4.0–10.5)
nRBC: 0.4 % — ABNORMAL HIGH (ref 0.0–0.2)

## 2020-04-02 LAB — PROTIME-INR
INR: 1.7 — ABNORMAL HIGH (ref 0.8–1.2)
Prothrombin Time: 19 seconds — ABNORMAL HIGH (ref 11.4–15.2)

## 2020-04-02 LAB — LACTIC ACID, PLASMA
Lactic Acid, Venous: 5.9 mmol/L (ref 0.5–1.9)
Lactic Acid, Venous: 2.9 mmol/L (ref 0.5–1.9)
Lactic Acid, Venous: 2.9 mmol/L (ref 0.5–1.9)

## 2020-04-02 LAB — RESPIRATORY PANEL BY RT PCR (FLU A&B, COVID)
Influenza A by PCR: NEGATIVE
Influenza B by PCR: NEGATIVE
SARS Coronavirus 2 by RT PCR: NEGATIVE

## 2020-04-02 LAB — BRAIN NATRIURETIC PEPTIDE: B Natriuretic Peptide: 196.6 pg/mL — ABNORMAL HIGH (ref 0.0–100.0)

## 2020-04-02 LAB — AMMONIA: Ammonia: 38 umol/L — ABNORMAL HIGH (ref 9–35)

## 2020-04-02 MED ORDER — NICOTINE 14 MG/24HR TD PT24
14.0000 mg | MEDICATED_PATCH | Freq: Every day | TRANSDERMAL | Status: DC
  Administered 2020-04-02 – 2020-04-05 (×4): 14 mg via TRANSDERMAL
  Filled 2020-04-02 (×4): qty 1

## 2020-04-02 MED ORDER — IOHEXOL 350 MG/ML SOLN
75.0000 mL | Freq: Once | INTRAVENOUS | Status: AC | PRN
  Administered 2020-04-02: 75 mL via INTRAVENOUS

## 2020-04-02 MED ORDER — ALBUMIN HUMAN 25 % IV SOLN
50.0000 g | Freq: Four times a day (QID) | INTRAVENOUS | Status: AC
  Administered 2020-04-02 – 2020-04-03 (×2): 50 g via INTRAVENOUS
  Filled 2020-04-02 (×2): qty 200

## 2020-04-02 MED ORDER — PANTOPRAZOLE SODIUM 40 MG PO TBEC
40.0000 mg | DELAYED_RELEASE_TABLET | Freq: Every day | ORAL | Status: DC
  Administered 2020-04-02: 40 mg via ORAL
  Filled 2020-04-02: qty 1

## 2020-04-02 MED ORDER — VITAMIN D (ERGOCALCIFEROL) 1.25 MG (50000 UNIT) PO CAPS: 50000.0000 [IU] | ORAL_CAPSULE | ORAL | Status: DC

## 2020-04-02 MED ORDER — FOLIC ACID 1 MG PO TABS
1.0000 mg | ORAL_TABLET | Freq: Every day | ORAL | Status: DC
  Administered 2020-04-02 – 2020-04-05 (×3): 1 mg via ORAL
  Filled 2020-04-02 (×3): qty 1

## 2020-04-02 MED ORDER — ADULT MULTIVITAMIN W/MINERALS CH
1.0000 | ORAL_TABLET | Freq: Every day | ORAL | Status: DC
  Administered 2020-04-02 – 2020-04-05 (×3): 1 via ORAL
  Filled 2020-04-02 (×3): qty 1

## 2020-04-02 MED ORDER — THIAMINE HCL 100 MG PO TABS
100.0000 mg | ORAL_TABLET | Freq: Every day | ORAL | Status: DC
  Administered 2020-04-02 – 2020-04-05 (×3): 100 mg via ORAL
  Filled 2020-04-02 (×3): qty 1

## 2020-04-02 MED ORDER — FUROSEMIDE 20 MG PO TABS: 60.0000 mg | ORAL_TABLET | Freq: Every day | ORAL | Status: DC

## 2020-04-02 MED ORDER — ONDANSETRON HCL 4 MG/2ML IJ SOLN: 4.0000 mg | Freq: Four times a day (QID) | INTRAMUSCULAR | Status: DC | PRN

## 2020-04-02 MED ORDER — SPIRONOLACTONE 25 MG PO TABS: 100.0000 mg | ORAL_TABLET | Freq: Every day | ORAL | Status: DC

## 2020-04-02 MED ORDER — ENOXAPARIN SODIUM 40 MG/0.4ML ~~LOC~~ SOLN: 40.0000 mg | SUBCUTANEOUS | Status: DC

## 2020-04-02 MED ORDER — FUROSEMIDE 20 MG PO TABS: 20.0000 mg | ORAL_TABLET | Freq: Every day | ORAL | Status: DC

## 2020-04-02 NOTE — ED Notes (Signed)
Spoke with admitting concerning pt's hypotension. Per admitting they changed her lasix dose from 60mg  to 20mg  but to hold dose for tonight and start tomorrow. Admitting is also putting in an order for albumin. If BP does not resolve with albumin per admitting please page them back. This RN will continue to monitor.

## 2020-04-02 NOTE — ED Notes (Signed)
Patient transported to CT 

## 2020-04-02 NOTE — H&P (Addendum)
History and Physical  Stacey Monroe YNW:[id number] DOB: January 21, 1961 DOA: 04/02/2020  Referring physician: Dr. Ralene Bathe  PCP: Isaac Bliss, Rayford Halsted, MD  Outpatient Specialists: Dr. Benson Norway, GI Patient coming from:  Home  Chief Complaint: Generalized weakness and shortness of breath  HPI: Stacey Monroe is a 58 y.o. female with medical history significant for alcoholic cirrhosis, former alcohol user, quit after her diagnosis of cirrhosis, tobacco use disorder who presented to [facility] ED due to worsening generalized weakness and worsening dyspnea.  Onset several weeks ago and progressively getting worse.  Associated with nausea without vomiting, poor appetite with no abdominal pain, chills with no fever.  Started having a cough about a few days ago.  She has been smoking less, previously 1 pack/day.  Dyspnea has also been at rest.  Endorses bilateral lower extremity edema in the last 24 to 48 hours.  Follows with GI Dr. Benson Norway, states she has not seen him in a while.  She saw her primary care provider a week ago for similar complaints.  Called her PCP the evening prior to her presentation to the ED and was advised to come to the ED for further evaluation.  ED work-up revealed acute transaminitis, jaundice, hyperbilirubinemia with T bili greater than 9.  Also revealed a progressive R lung mass with suspicion for neopasm.  TRH was asked to admit.  ED Course: Hypotensive, CT angio chest PE with and without contrast showing no evidence of pulmonary emboli, enlarging right lower lobe medial mass lesion with cavitation highly suspicious for underlying neoplasm.  Further work-up is recommended.  Advanced changes of cirrhosis in the liver.    Review of Systems: Review of systems as noted in the HPI. All other systems reviewed and are negative.   Past Medical History:  Diagnosis Date  . Chronic diastolic heart failure ([place])   . Chronic musculoskeletal pain   . Hyperlipidemia   . Hypertension   . Low back pain    . OSA on CPAP   . Osteoarthritis    knees  . Pre-eclampsia   . Sleep apnea, obstructive    patient denies  . Slipped cervical disc   . SOB (shortness of breath)    Past Surgical History:  Procedure Laterality Date  . CESAREAN SECTION    . COLONOSCOPY WITH PROPOFOL N/A 07/06/2019   Procedure: COLONOSCOPY WITH PROPOFOL;  Surgeon: Carol Ada, MD;  Location: [place];  Service: Endoscopy;  Laterality: N/A;  . ESOPHAGOGASTRODUODENOSCOPY (EGD) WITH PROPOFOL N/A 07/06/2019   Procedure: ESOPHAGOGASTRODUODENOSCOPY (EGD) WITH PROPOFOL;  Surgeon: Carol Ada, MD;  Location: [place];  Service: Endoscopy;  Laterality: N/A;  . HOT HEMOSTASIS N/A 07/06/2019   Procedure: HOT HEMOSTASIS (ARGON PLASMA COAGULATION/BICAP);  Surgeon: Carol Ada, MD;  Location: [place];  Service: Endoscopy;  Laterality: N/A;  . POLYPECTOMY  07/06/2019   Procedure: POLYPECTOMY;  Surgeon: Carol Ada, MD;  Location: [facility] ENDOSCOPY;  Service: Endoscopy;;  . TUBAL LIGATION      Social History:  reports that she has been smoking cigarettes. She has a 30.00 pack-year smoking history. She has never used smokeless tobacco. She reports that she does not drink alcohol and does not use drugs.   Allergies  Allergen Reactions  . Penicillin G Hives    Did it involve swelling of the face/tongue/throat, SOB, or low BP?No Did it involve sudden or severe rash/hives, skin peeling, or any reaction on the inside of your mouth or nose? Yes Did you need to seek medical attention at a  hospital or doctor's office? No When did it last happen?Child If all above answers are "NO", may proceed with cephalosporin use.    History reviewed. No pertinent family history.  Father with history of common bile duct cancer.  Prior to Admission medications   Medication Sig Start Date End Date Taking? Authorizing Provider  acetaminophen (TYLENOL) 500 MG tablet Take 500 mg by mouth every 6 (six) hours as needed for mild pain, moderate  pain or headache.   Yes [provider]  furosemide (LASIX) 20 MG tablet Take 3 tablets (60 mg total) by mouth daily. Call Carol Ada, MD for refills 11/22/19 04/02/20 Yes Isaac Bliss, Rayford Halsted, MD  omeprazole (PRILOSEC) 10 MG capsule Take 10 mg by mouth daily.   Yes [provider]  spironolactone (ALDACTONE) 100 MG tablet Take 2 tablets (200 mg total) by mouth daily. Patient taking differently: Take 100 mg by mouth daily.  08/08/19  Yes Patrecia Pour, MD  Vitamin D, Ergocalciferol, (DRISDOL) 1.25 MG (50000 UNIT) CAPS capsule Take 50,000 Units by mouth every 7 (seven) days.   Yes [provider]  zolpidem (AMBIEN) 10 MG tablet Take 1 tablet (10 mg total) by mouth at bedtime. 02/29/20  Yes Isaac Bliss, Rayford Halsted, MD    Physical Exam: BP (!) 115/45   Pulse 97   Temp 98.6 F (37 C) (Oral)   Resp (!) 22   Ht 5\' 7"  (1.702 m)   Wt 101.2 kg   SpO2 95%   BMI 34.93 kg/m   . General: 59 y.o. year-old female well developed well nourished in no acute distress.  Alert and oriented x3. . Cardiovascular: Regular rate and rhythm with no rubs or gallops.  No thyromegaly or JVD noted.  2+ pitting edema in lower extremity bilaterally. 2/4 pulses in all 4 extremities. Marland Kitchen Respiratory: Clear to auscultation with no wheezes or rales. Good inspiratory effort. . Abdomen: Soft nontender nondistended with normal bowel sounds x4 quadrants. . Muskuloskeletal: No cyanosis or clubbing noted bilaterally . Neuro: CN II-XII intact, strength, sensation, reflexes . Skin: No ulcerative lesions noted or rashes. Jaundice. Marland Kitchen Psychiatry: Judgement and insight appear normal. Mood is appropriate for condition and setting          Labs on Admission:  Basic Metabolic Panel: Recent Labs  Lab 04/02/20 1221  NA 121*  K 3.7  CL 76*  CO2 26  GLUCOSE 100*  BUN 5*  CREATININE 0.97  CALCIUM 8.6*   Liver Function Tests: Recent Labs  Lab 04/02/20 1221  AST 161*  ALT 45*  ALKPHOS 130*   BILITOT 9.7*  PROT 7.9  ALBUMIN 2.3*   No results for input(s): LIPASE, AMYLASE in the last 168 hours. Recent Labs  Lab 04/02/20 1405  AMMONIA 38*   CBC: Recent Labs  Lab 04/02/20 1221  WBC 5.5  HGB 8.9*  HCT 25.5*  MCV 112.8*  PLT PLATELET CLUMPS NOTED ON SMEAR, UNABLE TO ESTIMATE   Cardiac Enzymes: No results for input(s): CKTOTAL, CKMB, CKMBINDEX, TROPONINI in the last 168 hours.  BNP (last 3 results) Recent Labs    07/31/19 1500 04/02/20 1405  BNP 276.0* 196.6*    ProBNP (last 3 results) No results for input(s): PROBNP in the last 8760 hours.  CBG: Recent Labs  Lab 04/02/20 1340  GLUCAP 99    Radiological Exams on Admission: CT Angio Chest PE W and/or Wo Contrast  Result Date: 04/02/2020 CLINICAL DATA:  Shortness of breath EXAM: CT ANGIOGRAPHY CHEST WITH CONTRAST TECHNIQUE:  Multidetector CT imaging of the chest was performed using the standard protocol during bolus administration of intravenous contrast. Multiplanar CT image reconstructions and MIPs were obtained to evaluate the vascular anatomy. CONTRAST:  6mL OMNIPAQUE IOHEXOL 350 MG/ML SOLN COMPARISON:  Chest x-ray from earlier in the same day, CT from 08/02/2019 FINDINGS: Cardiovascular: Thoracic aorta demonstrates atherosclerotic calcifications without aneurysmal dilatation or dissection. Coronary calcifications are noted. The pulmonary artery shows a normal branching pattern. No definitive pulmonary embolism is seen. Mediastinum/Nodes: Thoracic inlet is within normal limits. No sizable hilar or mediastinal adenopathy is noted. The esophagus as visualized is within normal limits. Lungs/Pleura: Lungs are well aerated bilaterally. In the medial aspect of the right lower lobe there is again identified a partially cavitary mass lesion similar to that seen on the prior exam. It has enlarged however in maximum dimension now measuring approximately 4.5 cm increased from 3.6 cm. Given this enlargement in size this is  felt to be highly suspicious for underlying neoplasm Upper Abdomen: Visualized upper abdomen demonstrates advance changes of cirrhosis. Phase of contrast limits evaluation for underlying mass lesion although the overall appearance is similar to that seen on the prior exam. Multiple gallstones are noted within the gallbladder. No complicating factors are seen. Musculoskeletal: Mild degenerative changes are noted. Mild T12 compression deformity new from the prior exam. Review of the MIP images confirms the above findings. IMPRESSION: No evidence of pulmonary emboli. Enlarging right lower lobe medial mass lesion with cavitation highly suspicious for underlying neoplasm. Further workup is recommended. Advanced changes of cirrhosis in the liver. The timing of the contrast bolus limits evaluation for underlying mass lesion. Multiple gallstones are noted without complicating factors. Electronically Signed   By: Inez Catalina M.D.   On: 04/02/2020 16:05   DG Chest Portable 1 View  Result Date: 04/02/2020 CLINICAL DATA:  Altered mental status with shortness of breath EXAM: PORTABLE CHEST 1 VIEW COMPARISON:  August 02, 2019 chest radiograph and chest CT FINDINGS: Lungs are clear. Heart is enlarged, stable, with pulmonary vascularity normal. No adenopathy. There is aortic atherosclerosis. No bone lesions. IMPRESSION: Stable cardiomegaly.  No edema or airspace opacity. Aortic Atherosclerosis (ICD10-I70.0). Electronically Signed   By: Lowella Grip III M.D.   On: 04/02/2020 13:20    EKG: I independently viewed the EKG done and my findings are as followed: Accelerated junctional rhythm 94.  Assessment/Plan Present on Admission: **None**  Active Problems:   Generalized weakness  Generalized weakness likely multifactorial secondary to hypovolemic hyponatremia, hypotension, underlying cirrhosis, possible malignancy Presented with serum sodium 121, volume overload on exam, and hypotension with MAP in the 50s, RLL  pulmonary mass lesion on CT scan Started fluid restriction, less than 1200 cc/day Obtain serum sodium every 8 hours x1 day Repeat chemistry panel in the morning  Hypotension in the setting of cirrhosis and hypoalbuminemia Presented with hypotension with MAP in the 50s Serum albumin 2.3 Hold off home spironolactone and home oral Lasix for now Denies fevers at home, no abdominal pain. UA is pending, follow results Ordered IV albumin 50 g every 6 hours x 2 doses. Maintain MAP greater than 65  Lactic acidosis Presented with lactic acid 5.9 Repeat lactic acid level Receiving IV albumin, if repeat lactic acid elevated may consider gentle IV fluid hydration, NS.  Follow UA results to r/o active infective process.  Worsening right lower lobe medial mass lesion with cavitation highly suspicious for underlying neoplasm May consider pulmonary consult in the morning for further work-up or possible bronchoscopy  Acute transaminitis in the setting of alcoholic cirrhosis Presented with elevated liver chemistries, Tbili greater than 9, ammonia level 38 Multiple gallstones noted within the gallbladder Quit alcohol use years ago when diagnosed with cirrhosis Obtain right upper quadrant abdominal ultrasound to further assess the gallbladder and CBD Follows with GI Dr. Benson Norway Repeat CMP in the morning and consult GI in the a.m.  Hypoalbuminemia in the setting of advanced liver disease cirrhosis Serum albumin 2.3  Autoanticoagulation in the setting of advanced liver disease Presented with INR of 1.7 Hold off pharmacological DVT prophylaxis  Dyspnea without hypoxia CTA chest negative for PE. Possibly related to right lower lobe mass lesion Continue to monitor and maintain oxygen saturation greater than 92%  Macrocytic anemia Presented with hemoglobin 8.9, MCV 112 Baseline hemoglobin appears to be 11 No overt bleeding Obtain FOBT Consult GI in the morning  Tobacco use disorder Nicotine patch  Tobacco cessation counseling     DVT prophylaxis: SCDs.  Code Status: Full code as stated by the patient herself  Family Communication: None at bedside.  Disposition Plan: Admit to telemetry medical  Consults called: None.  Admission status: Inpatient status.   Status is: Inpatient    Dispo:  Patient From: Home  Planned Disposition: Home with Health Care Svc  Expected discharge date: 04/04/20  Medically stable for discharge: No        Kayleen Memos MD [facility] Pager [phone]  If 7PM-7AM, please contact night-coverage [email or web address] Password [email or web address]  04/02/2020, 5:04 PM

## 2020-04-02 NOTE — ED Provider Notes (Signed)
Pt care assumed at 1500.  Pt here with progressive generalized weakness and sob.  Pt with hx/o cirrhosis due to alcoholic liver disease, cavitary lung lesion. Sodium 121.   CTA pending.    CTA is negative for acute PE. CT does demonstrate progressive of knowing lung lesion. Discussed with patient findings of studies. Given her progressive weakness, tachypnea as well as hyponatremia plan to admit for further treatment. Medicine consulted for admission.   Quintella Reichert, MD 04/02/20 1954

## 2020-04-02 NOTE — ED Notes (Signed)
Asked Network engineer to page admitting

## 2020-04-02 NOTE — ED Notes (Signed)
MD Schlossman notified of Lactic 5.9

## 2020-04-02 NOTE — ED Triage Notes (Addendum)
Patient arrives to ED with complaints of fatigue, shortness of breath, and weakness that has been going on for months but worsening in the past two weeks. Pt with hx cirrhosis and has a lung lesion found on her last CT. Pt states she is not able to perform ADL's due to weakness. Skin yellow, feels slightly confused.

## 2020-04-02 NOTE — ED Notes (Signed)
No response from admitting. Paged Admitting from [place]. Will continue to monitor pt.

## 2020-04-02 NOTE — ED Provider Notes (Signed)
[facility] EMERGENCY DEPARTMENT Provider Note   CSN: [id number] Arrival date & time: 04/02/20  1137     History Chief Complaint  Patient presents with  . Weakness  . Shortness of Breath    Stacey Monroe is a 59 y.o. female.  HPI      Presents with severe fatigue, shortness of breath worsening over the last 2 weeks Can barely walk with walker due to weakness Lightheadedness, no syncope  No chest pain Shortness of breath constant , worse with walking Worsening swelling in the legs No n/v Low appetite but has been eating No abdominal pain Cough in last few days BM every 3-4 dAys, constipated  Past Medical History:  Diagnosis Date  . Chronic diastolic heart failure ([place])   . Chronic musculoskeletal pain   . Hyperlipidemia   . Hypertension   . Low back pain   . OSA on CPAP   . Osteoarthritis    knees  . Pre-eclampsia   . Sleep apnea, obstructive    patient denies  . Slipped cervical disc   . SOB (shortness of breath)     Patient Active Problem List   Diagnosis Date Noted  . Vitamin D deficiency 11/23/2019  . Orthostatic hypotension 11/14/2019  . Shortness of breath 08/02/2019  . Edema 08/02/2019  . Anemia 07/31/2019  . Hyponatremia 07/31/2019  . Hypokalemia 07/31/2019  . Decompensated hepatic cirrhosis ([place]) 07/05/2019  . Cirrhosis ([place]) 04/06/2016  . Tobacco abuse 04/06/2016  . Stress 04/06/2016    Past Surgical History:  Procedure Laterality Date  . CESAREAN SECTION    . COLONOSCOPY WITH PROPOFOL N/A 07/06/2019   Procedure: COLONOSCOPY WITH PROPOFOL;  Surgeon: Carol Ada, MD;  Location: [facility];  Service: Endoscopy;  Laterality: N/A;  . ESOPHAGOGASTRODUODENOSCOPY (EGD) WITH PROPOFOL N/A 07/06/2019   Procedure: ESOPHAGOGASTRODUODENOSCOPY (EGD) WITH PROPOFOL;  Surgeon: Carol Ada, MD;  Location: [facility];  Service: Endoscopy;  Laterality: N/A;  . HOT HEMOSTASIS N/A 07/06/2019   Procedure: HOT HEMOSTASIS (ARGON PLASMA  COAGULATION/BICAP);  Surgeon: Carol Ada, MD;  Location: [facility];  Service: Endoscopy;  Laterality: N/A;  . POLYPECTOMY  07/06/2019   Procedure: POLYPECTOMY;  Surgeon: Carol Ada, MD;  Location[facility] ENDOSCOPY;  Service: Endoscopy;;  . TUBAL LIGATION       OB History   No obstetric history on file.     History reviewed. No pertinent family history.  Social History   Tobacco Use  . Smoking status: Current Every Day Smoker    Packs/day: 1.00    Years: 30.00    Pack years: 30.00    Types: Cigarettes  . Smokeless tobacco: Never Used  Vaping Use  . Vaping Use: Never used  Substance Use Topics  . Alcohol use: No  . Drug use: No    Home Medications Prior to Admission medications   Medication Sig Start Date End Date Taking? Authorizing Provider  acetaminophen (TYLENOL) 500 MG tablet Take 500 mg by mouth every 6 (six) hours as needed for mild pain, moderate pain or headache.    [provider]  diazepam (VALIUM) 10 MG tablet Take 10 mg by mouth daily as needed. 11/20/19   [provider]  furosemide (LASIX) 20 MG tablet Take 3 tablets (60 mg total) by mouth daily. Call Carol Ada, MD for refills 11/22/19 04/01/20  Isaac Bliss, Rayford Halsted, MD  omeprazole (PRILOSEC) 10 MG capsule Take 10 mg by mouth daily.    [provider]  potassium chloride (KLOR-CON) 10  MEQ tablet Take 1 tablet by mouth daily. 10/05/19   [provider]  spironolactone (ALDACTONE) 100 MG tablet Take 2 tablets (200 mg total) by mouth daily. Patient taking differently: Take 100 mg by mouth daily.  08/08/19   Patrecia Pour, MD  zolpidem (AMBIEN) 10 MG tablet Take 1 tablet (10 mg total) by mouth at bedtime. 02/29/20   Isaac Bliss, Rayford Halsted, MD    Allergies    Penicillin g  Review of Systems   Review of Systems  Constitutional: Positive for activity change, appetite change and fatigue. Negative for fever.  HENT: Negative for sore throat.   Eyes: Negative for visual  disturbance.  Respiratory: Positive for cough and shortness of breath.   Cardiovascular: Positive for leg swelling. Negative for chest pain.  Gastrointestinal: Positive for constipation. Negative for abdominal pain, diarrhea, nausea and vomiting.  Genitourinary: Negative for difficulty urinating and dysuria.  Musculoskeletal: Negative for back pain and neck pain.  Skin: Negative for rash.  Neurological: Positive for light-headedness. Negative for syncope and headaches.    Physical Exam Updated Vital Signs BP (!) 106/45   Pulse 96   Temp 98.6 F (37 C) (Oral)   Resp (!) 21   Ht 5\' 7"  (1.702 m)   Wt 101.2 kg   SpO2 97%   BMI 34.93 kg/m   Physical Exam Vitals and nursing note reviewed.  Constitutional:      General: She is not in acute distress.    Appearance: She is ill-appearing. She is not diaphoretic.  HENT:     Head: Normocephalic and atraumatic.  Eyes:     Conjunctiva/sclera: Conjunctivae normal.  Cardiovascular:     Rate and Rhythm: Normal rate and regular rhythm.     Heart sounds: Normal heart sounds. No murmur heard.  No friction rub. No gallop.   Pulmonary:     Effort: Pulmonary effort is normal. No respiratory distress.     Breath sounds: Normal breath sounds. No wheezing or rales.  Abdominal:     General: There is no distension.     Palpations: Abdomen is soft.     Tenderness: There is no abdominal tenderness. There is no guarding.  Musculoskeletal:        General: No tenderness.     Cervical back: Normal range of motion.     Right lower leg: Edema present.     Left lower leg: Edema present.  Skin:    General: Skin is warm and dry.     Coloration: Skin is jaundiced.     Findings: No erythema or rash.  Neurological:     Mental Status: She is alert and oriented to person, place, and time.     ED Results / Procedures / Treatments   Labs (all labs ordered are listed, but only abnormal results are displayed) Labs Reviewed  CBC - Abnormal; Notable for  the following components:      Result Value   RBC 2.26 (*)    Hemoglobin 8.9 (*)    HCT 25.5 (*)    MCV 112.8 (*)    MCH 39.4 (*)    RDW 20.8 (*)    nRBC 0.4 (*)    All other components within normal limits  COMPREHENSIVE METABOLIC PANEL - Abnormal; Notable for the following components:   Sodium 121 (*)    Chloride 76 (*)    Glucose, Bld 100 (*)    BUN 5 (*)    Calcium 8.6 (*)    Albumin 2.3 (*)  AST 161 (*)    ALT 45 (*)    Alkaline Phosphatase 130 (*)    Total Bilirubin 9.7 (*)    Anion gap 19 (*)    All other components within normal limits  AMMONIA - Abnormal; Notable for the following components:   Ammonia 38 (*)    All other components within normal limits  PROTIME-INR - Abnormal; Notable for the following components:   Prothrombin Time 19.0 (*)    INR 1.7 (*)    All other components within normal limits  LACTIC ACID, PLASMA - Abnormal; Notable for the following components:   Lactic Acid, Venous 5.9 (*)    All other components within normal limits  BRAIN NATRIURETIC PEPTIDE - Abnormal; Notable for the following components:   B Natriuretic Peptide 196.6 (*)    All other components within normal limits  RESPIRATORY PANEL BY RT PCR (FLU A&B, COVID)  URINALYSIS, ROUTINE W REFLEX MICROSCOPIC  LACTIC ACID, PLASMA  CBG MONITORING, ED    EKG None  Radiology CT Angio Chest PE W and/or Wo Contrast  Result Date: 04/02/2020 CLINICAL DATA:  Shortness of breath EXAM: CT ANGIOGRAPHY CHEST WITH CONTRAST TECHNIQUE: Multidetector CT imaging of the chest was performed using the standard protocol during bolus administration of intravenous contrast. Multiplanar CT image reconstructions and MIPs were obtained to evaluate the vascular anatomy. CONTRAST:  34mL OMNIPAQUE IOHEXOL 350 MG/ML SOLN COMPARISON:  Chest x-ray from earlier in the same day, CT from 08/02/2019 FINDINGS: Cardiovascular: Thoracic aorta demonstrates atherosclerotic calcifications without aneurysmal dilatation or  dissection. Coronary calcifications are noted. The pulmonary artery shows a normal branching pattern. No definitive pulmonary embolism is seen. Mediastinum/Nodes: Thoracic inlet is within normal limits. No sizable hilar or mediastinal adenopathy is noted. The esophagus as visualized is within normal limits. Lungs/Pleura: Lungs are well aerated bilaterally. In the medial aspect of the right lower lobe there is again identified a partially cavitary mass lesion similar to that seen on the prior exam. It has enlarged however in maximum dimension now measuring approximately 4.5 cm increased from 3.6 cm. Given this enlargement in size this is felt to be highly suspicious for underlying neoplasm Upper Abdomen: Visualized upper abdomen demonstrates advance changes of cirrhosis. Phase of contrast limits evaluation for underlying mass lesion although the overall appearance is similar to that seen on the prior exam. Multiple gallstones are noted within the gallbladder. No complicating factors are seen. Musculoskeletal: Mild degenerative changes are noted. Mild T12 compression deformity new from the prior exam. Review of the MIP images confirms the above findings. IMPRESSION: No evidence of pulmonary emboli. Enlarging right lower lobe medial mass lesion with cavitation highly suspicious for underlying neoplasm. Further workup is recommended. Advanced changes of cirrhosis in the liver. The timing of the contrast bolus limits evaluation for underlying mass lesion. Multiple gallstones are noted without complicating factors. Electronically Signed   By: Inez Catalina M.D.   On: 04/02/2020 16:05   DG Chest Portable 1 View  Result Date: 04/02/2020 CLINICAL DATA:  Altered mental status with shortness of breath EXAM: PORTABLE CHEST 1 VIEW COMPARISON:  August 02, 2019 chest radiograph and chest CT FINDINGS: Lungs are clear. Heart is enlarged, stable, with pulmonary vascularity normal. No adenopathy. There is aortic atherosclerosis.  No bone lesions. IMPRESSION: Stable cardiomegaly.  No edema or airspace opacity. Aortic Atherosclerosis (ICD10-I70.0). Electronically Signed   By: Lowella Grip III M.D.   On: 04/02/2020 13:20    Procedures Procedures (including critical care time)  Medications Ordered in  ED Medications  iohexol (OMNIPAQUE) 350 MG/ML injection 75 mL (75 mLs Intravenous Contrast Given 04/02/20 1550)    ED Course  I have reviewed the triage vital signs and the nursing notes.  Pertinent labs & imaging results that were available during my care of the patient were reviewed by me and considered in my medical decision making (see chart for details).    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  No sign of pulmonary edema or pleural effusions on chest x-ray.  Will evaluate dyspnea with prior service suspicion for cavitary mass with CT PE study.  Labs are significant for worsening liver failure with a more significantly elevated bilirubin, elevated INR.  Labs also significant for worsening hyponatremia with a sodium of 121, from previous of 129.  Feel her generalized weakness is likely secondary to her worsening liver failure and hyponatremia.  Lactic acid elevated to 5.7, likely secondary to liver disease and do not suspect sepsis at this time.    Plan to follow up on CT PE study, admit for further care.   Final Clinical Impression(s) / ED Diagnoses Final diagnoses:  Alcoholic cirrhosis, unspecified whether ascites present ([place])  Hyponatremia  Generalized weakness  Shortness of breath  Lactic acidosis    Rx / DC Orders ED Discharge Orders    None       Gareth Morgan, MD 04/02/20 1639

## 2020-04-03 ENCOUNTER — Encounter (HOSPITAL_COMMUNITY): Payer: Self-pay | Admitting: Internal Medicine

## 2020-04-03 ENCOUNTER — Telehealth: Payer: Self-pay | Admitting: Internal Medicine

## 2020-04-03 ENCOUNTER — Encounter (HOSPITAL_COMMUNITY): Payer: Self-pay | Admitting: Certified Registered Nurse Anesthetist

## 2020-04-03 ENCOUNTER — Inpatient Hospital Stay (HOSPITAL_COMMUNITY): Payer: Medicare Other

## 2020-04-03 ENCOUNTER — Encounter (HOSPITAL_COMMUNITY)
Admission: EM | Discharge status: Against Medical Advice | Payer: Self-pay | Source: Home / Self Care | Attending: Internal Medicine

## 2020-04-03 DIAGNOSIS — J984 Other disorders of lung: Secondary | ICD-10-CM | POA: Diagnosis not present

## 2020-04-03 DIAGNOSIS — D649 Anemia, unspecified: Secondary | ICD-10-CM

## 2020-04-03 DIAGNOSIS — R918 Other nonspecific abnormal finding of lung field: Secondary | ICD-10-CM | POA: Diagnosis not present

## 2020-04-03 DIAGNOSIS — R531 Weakness: Secondary | ICD-10-CM

## 2020-04-03 DIAGNOSIS — D696 Thrombocytopenia, unspecified: Secondary | ICD-10-CM

## 2020-04-03 DIAGNOSIS — K7030 Alcoholic cirrhosis of liver without ascites: Secondary | ICD-10-CM

## 2020-04-03 DIAGNOSIS — R7989 Other specified abnormal findings of blood chemistry: Secondary | ICD-10-CM

## 2020-04-03 LAB — CBC WITH DIFFERENTIAL/PLATELET
Abs Immature Granulocytes: 0.02 10*3/uL (ref 0.00–0.07)
Basophils Absolute: 0 10*3/uL (ref 0.0–0.1)
Basophils Relative: 0 %
Eosinophils Absolute: 0.1 10*3/uL (ref 0.0–0.5)
Eosinophils Relative: 2 %
HCT: 18.8 % — ABNORMAL LOW (ref 36.0–46.0)
Hemoglobin: 6.4 g/dL — CL (ref 12.0–15.0)
Immature Granulocytes: 0 %
Lymphocytes Relative: 14 %
Lymphs Abs: 0.7 10*3/uL (ref 0.7–4.0)
MCH: 38.8 pg — ABNORMAL HIGH (ref 26.0–34.0)
MCHC: 34 g/dL (ref 30.0–36.0)
MCV: 113.9 fL — ABNORMAL HIGH (ref 80.0–100.0)
Monocytes Absolute: 0.7 10*3/uL (ref 0.1–1.0)
Monocytes Relative: 16 %
Neutro Abs: 3.1 10*3/uL (ref 1.7–7.7)
Neutrophils Relative %: 68 %
Platelets: 77 10*3/uL — ABNORMAL LOW (ref 150–400)
RBC: 1.65 MIL/uL — ABNORMAL LOW (ref 3.87–5.11)
RDW: 21.8 % — ABNORMAL HIGH (ref 11.5–15.5)
WBC: 4.6 10*3/uL (ref 4.0–10.5)
nRBC: 0 % (ref 0.0–0.2)

## 2020-04-03 LAB — COMPREHENSIVE METABOLIC PANEL
ALT: 32 U/L (ref 0–44)
AST: 89 U/L — ABNORMAL HIGH (ref 15–41)
Albumin: 2.9 g/dL — ABNORMAL LOW (ref 3.5–5.0)
Alkaline Phosphatase: 78 U/L (ref 38–126)
Anion gap: 12 (ref 5–15)
BUN: <5 mg/dL — ABNORMAL LOW (ref 6–20)
CO2: 31 mmol/L (ref 22–32)
Calcium: 8.9 mg/dL (ref 8.9–10.3)
Chloride: 78 mmol/L — ABNORMAL LOW (ref 98–111)
Creatinine, Ser: 0.87 mg/dL (ref 0.44–1.00)
GFR calc non Af Amer: >60 mL/min (ref >60)
Glucose, Bld: 102 mg/dL — ABNORMAL HIGH (ref 70–99)
Potassium: 2.6 mmol/L — CL (ref 3.5–5.1)
Sodium: 121 mmol/L — ABNORMAL LOW (ref 135–145)
Total Bilirubin: 10.1 mg/dL — ABNORMAL HIGH (ref 0.3–1.2)
Total Protein: 7 g/dL (ref 6.5–8.1)

## 2020-04-03 LAB — LACTIC ACID, PLASMA: Lactic Acid, Venous: 1.6 mmol/L (ref 0.5–1.9)

## 2020-04-03 LAB — IRON AND TIBC
Iron: 123 ug/dL (ref 28–170)
Saturation Ratios: 89 % — ABNORMAL HIGH (ref 10.4–31.8)
TIBC: 139 ug/dL — ABNORMAL LOW (ref 250–450)
UIBC: 16 ug/dL

## 2020-04-03 LAB — RETICULOCYTES
Immature Retic Fract: 31.4 % — ABNORMAL HIGH (ref 2.3–15.9)
RBC.: 1.78 MIL/uL — ABNORMAL LOW (ref 3.87–5.11)
Retic Count, Absolute: 93.1 10*3/uL (ref 19.0–186.0)
Retic Ct Pct: 5.2 % — ABNORMAL HIGH (ref 0.4–3.1)

## 2020-04-03 LAB — SODIUM
Sodium: 122 mmol/L — ABNORMAL LOW (ref 135–145)
Sodium: 122 mmol/L — ABNORMAL LOW (ref 135–145)

## 2020-04-03 LAB — MAGNESIUM: Magnesium: 1.4 mg/dL — ABNORMAL LOW (ref 1.7–2.4)

## 2020-04-03 LAB — HEMOGLOBIN AND HEMATOCRIT, BLOOD
HCT: 26 % — ABNORMAL LOW (ref 36.0–46.0)
Hemoglobin: 9 g/dL — ABNORMAL LOW (ref 12.0–15.0)

## 2020-04-03 LAB — PREPARE RBC (CROSSMATCH)

## 2020-04-03 LAB — FOLATE: Folate: 8 ng/mL (ref >5.9)

## 2020-04-03 LAB — VITAMIN B12: Vitamin B-12: 1382 pg/mL — ABNORMAL HIGH (ref 180–914)

## 2020-04-03 LAB — AMMONIA: Ammonia: 51 umol/L — ABNORMAL HIGH (ref 9–35)

## 2020-04-03 SURGERY — CANCELLED PROCEDURE

## 2020-04-03 MED ORDER — PANTOPRAZOLE SODIUM 40 MG IV SOLR
40.0000 mg | Freq: Two times a day (BID) | INTRAVENOUS | Status: DC
  Administered 2020-04-03 – 2020-04-05 (×5): 40 mg via INTRAVENOUS
  Filled 2020-04-03 (×5): qty 40

## 2020-04-03 MED ORDER — MAGNESIUM SULFATE 2 GM/50ML IV SOLN
2.0000 g | Freq: Once | INTRAVENOUS | Status: AC
  Administered 2020-04-03: 2 g via INTRAVENOUS
  Filled 2020-04-03: qty 50

## 2020-04-03 MED ORDER — LACTULOSE 10 GM/15ML PO SOLN
10.0000 g | Freq: Two times a day (BID) | ORAL | Status: DC
  Administered 2020-04-03 – 2020-04-05 (×4): 10 g via ORAL
  Filled 2020-04-03 (×4): qty 15

## 2020-04-03 MED ORDER — POTASSIUM CHLORIDE 10 MEQ/100ML IV SOLN
10.0000 meq | INTRAVENOUS | Status: AC
  Administered 2020-04-03 (×6): 10 meq via INTRAVENOUS
  Filled 2020-04-03 (×5): qty 100

## 2020-04-03 MED ORDER — ZOLPIDEM TARTRATE 5 MG PO TABS
5.0000 mg | ORAL_TABLET | Freq: Once | ORAL | Status: AC
  Administered 2020-04-03: 5 mg via ORAL
  Filled 2020-04-03: qty 1

## 2020-04-03 MED ORDER — POTASSIUM CHLORIDE 10 MEQ/100ML IV SOLN
INTRAVENOUS | Status: AC
  Filled 2020-04-03: qty 100

## 2020-04-03 MED ORDER — POTASSIUM CHLORIDE CRYS ER 20 MEQ PO TBCR
40.0000 meq | EXTENDED_RELEASE_TABLET | ORAL | Status: DC
  Administered 2020-04-03: 40 meq via ORAL
  Filled 2020-04-03: qty 2

## 2020-04-03 MED ORDER — MENTHOL 3 MG MT LOZG
1.0000 | LOZENGE | OROMUCOSAL | Status: DC | PRN
  Administered 2020-04-03: 3 mg via ORAL
  Filled 2020-04-03: qty 9

## 2020-04-03 MED ORDER — ZOLPIDEM TARTRATE 5 MG PO TABS
10.0000 mg | ORAL_TABLET | Freq: Once | ORAL | Status: AC
  Administered 2020-04-04: 10 mg via ORAL
  Filled 2020-04-03: qty 2

## 2020-04-03 MED ORDER — FUROSEMIDE 10 MG/ML IJ SOLN
40.0000 mg | Freq: Two times a day (BID) | INTRAMUSCULAR | Status: DC
  Administered 2020-04-03 (×2): 40 mg via INTRAVENOUS
  Filled 2020-04-03 (×2): qty 4

## 2020-04-03 MED ORDER — SODIUM CHLORIDE 0.9% IV SOLUTION: Freq: Once | INTRAVENOUS | Status: AC

## 2020-04-03 MED ORDER — PEG 3350-KCL-NA BICARB-NACL 420 G PO SOLR
4000.0000 mL | Freq: Once | ORAL | Status: AC
  Administered 2020-04-03: 4000 mL via ORAL
  Filled 2020-04-03: qty 4000

## 2020-04-03 SURGICAL SUPPLY — 15 items

## 2020-04-03 NOTE — Plan of Care (Signed)
  Problem: Education: Goal: Knowledge of General Education information will improve Description Including pain rating scale, medication(s)/side effects and non-pharmacologic comfort measures Outcome: Progressing   Problem: Health Behavior/Discharge Planning: Goal: Ability to manage health-related needs will improve Outcome: Progressing   

## 2020-04-03 NOTE — Consult Note (Signed)
Reason for Consult: Anemia, fatigue, ETOH cirrhosis Referring Physician: [facility]  Lattie Haw T Hawn HPI: This is a 59 year old female who is well-known to me with a PMH of GAVE, small esophageal varices, proximal colonic AVMs, IDA, ETOH cirrhosis, obesity, HTN, hyperlipidemia, and a RLL lung mass admitted for progressive weakness.  She reports having a progressive weakness over the past several weeks and this was accompanied nausea, worsening SOB, and anorexia.  Per her report she lost weight from the 260 lbs range down to her current 223 lbs.  She was last in the office on 08/2019 and she was progressing better with control of her LE edema.  Makara reports good control of her edema with the diuretics until this admission.  In the ER she was noted to be hypotensive and anemic with IV hydration.  Her HGB dropped down to 6.4 g/dL from 8.9 g/dL.  Earlier in the year she underwent an EGD/colonoscopy to work up her IDA and she was noted to have GAVE and proximal colonic AVMs.  Both of these issues were treated with APC.  Her HGB did improve up to 11.9 g/dL.  She denies any issues with hematemesis, hemoptysis, hematochezia or melena.  With her SOB she underwent a repeat CTA and it was negative for a PE, but it did show an enlarging RLL cavitary mass suspicious for malignancy.  She is a long time smoker.  In February this year this mass was identified, but she never followed up with Pulmonary.  Her admission labs also show that her AST and ALT increased as well as her TB.  Her RUQ U/S today does not show any masses and it appears stable with cholelithiasis.  She denies any ETOH use.  Past Medical History:  Diagnosis Date  . Chronic diastolic heart failure ([place])   . Chronic musculoskeletal pain   . Hyperlipidemia   . Hypertension   . Low back pain   . OSA on CPAP   . Osteoarthritis    knees  . Pre-eclampsia   . Sleep apnea, obstructive    patient denies  . Slipped cervical disc   . SOB (shortness of  breath)     Past Surgical History:  Procedure Laterality Date  . CESAREAN SECTION    . COLONOSCOPY WITH PROPOFOL N/A 07/06/2019   Procedure: COLONOSCOPY WITH PROPOFOL;  Surgeon: Carol Ada, MD;  Location: [facility];  Service: Endoscopy;  Laterality: N/A;  . ESOPHAGOGASTRODUODENOSCOPY (EGD) WITH PROPOFOL N/A 07/06/2019   Procedure: ESOPHAGOGASTRODUODENOSCOPY (EGD) WITH PROPOFOL;  Surgeon: Carol Ada, MD;  Location: [facility];  Service: Endoscopy;  Laterality: N/A;  . HOT HEMOSTASIS N/A 07/06/2019   Procedure: HOT HEMOSTASIS (ARGON PLASMA COAGULATION/BICAP);  Surgeon: Carol Ada, MD;  Location: [facility];  Service: Endoscopy;  Laterality: N/A;  . POLYPECTOMY  07/06/2019   Procedure: POLYPECTOMY;  Surgeon: Carol Ada, MD;  Location: [facility];  Service: Endoscopy;;  . TUBAL LIGATION      History reviewed. No pertinent family history.  Social History:  reports that she has been smoking cigarettes. She has a 30.00 pack-year smoking history. She has never used smokeless tobacco. She reports that she does not drink alcohol and does not use drugs.  Allergies:  Allergies  Allergen Reactions  . Penicillin G Hives    Did it involve swelling of the face/tongue/throat, SOB, or low BP?No Did it involve sudden or severe rash/hives, skin peeling, or any reaction on the inside of your mouth or nose? Yes Did you need to  seek medical attention at a hospital or doctor's office? No When did it last happen?Child If all above answers are "NO", may proceed with cephalosporin use.    Medications:  Scheduled: . folic acid  1 mg Oral Daily  . furosemide  40 mg Intravenous Q12H  . lactulose  10 g Oral BID  . multivitamin with minerals  1 tablet Oral Daily  . nicotine  14 mg Transdermal Daily  . pantoprazole (PROTONIX) IV  40 mg Intravenous Q12H  . thiamine  100 mg Oral Daily  . [START ON 04/06/2020] Vitamin D (Ergocalciferol)  50,000 Units Oral Q7 days   Continuous: . potassium  chloride 10 mEq (04/03/20 1327)    Results for orders placed or performed during the hospital encounter of 04/02/20 (from the past 24 hour(s))  Respiratory Panel by RT PCR (Flu A&B, Covid) - Nasopharyngeal Swab     Status: None   Collection Time: 04/02/20  1:31 PM   Specimen: Nasopharyngeal Swab  Result Value Ref Range   SARS Coronavirus 2 by RT PCR NEGATIVE NEGATIVE   Influenza A by PCR NEGATIVE NEGATIVE   Influenza B by PCR NEGATIVE NEGATIVE  CBG monitoring, ED     Status: None   Collection Time: 04/02/20  1:40 PM  Result Value Ref Range   Glucose-Capillary 99 70 - 99 mg/dL  Ammonia     Status: Abnormal   Collection Time: 04/02/20  2:05 PM  Result Value Ref Range   Ammonia 38 (H) 9 - 35 umol/L  Protime-INR     Status: Abnormal   Collection Time: 04/02/20  2:05 PM  Result Value Ref Range   Prothrombin Time 19.0 (H) 11.4 - 15.2 seconds   INR 1.7 (H) 0.8 - 1.2  Lactic acid, plasma     Status: Abnormal   Collection Time: 04/02/20  2:05 PM  Result Value Ref Range   Lactic Acid, Venous 5.9 (HH) 0.5 - 1.9 mmol/L  Brain natriuretic peptide     Status: Abnormal   Collection Time: 04/02/20  2:05 PM  Result Value Ref Range   B Natriuretic Peptide 196.6 (H) 0.0 - 100.0 pg/mL  Urinalysis, Routine w reflex microscopic     Status: Abnormal   Collection Time: 04/02/20  8:29 PM  Result Value Ref Range   Color, Urine AMBER (A) YELLOW   APPearance HAZY (A) CLEAR   Specific Gravity, Urine 1.040 (H) 1.005 - 1.030   pH 5.0 5.0 - 8.0   Glucose, UA NEGATIVE NEGATIVE mg/dL   Hgb urine dipstick NEGATIVE NEGATIVE   Bilirubin Urine SMALL (A) NEGATIVE   Ketones, ur NEGATIVE NEGATIVE mg/dL   Protein, ur NEGATIVE NEGATIVE mg/dL   Nitrite POSITIVE (A) NEGATIVE   Leukocytes,Ua LARGE (A) NEGATIVE   RBC / HPF 0-5 0 - 5 RBC/hpf   WBC, UA >50 (H) 0 - 5 WBC/hpf   Bacteria, UA FEW (A) NONE SEEN   Squamous Epithelial / LPF 0-5 0 - 5  Lactic acid, plasma     Status: Abnormal   Collection Time: 04/02/20   8:29 PM  Result Value Ref Range   Lactic Acid, Venous 2.9 (HH) 0.5 - 1.9 mmol/L  Lactic acid, plasma     Status: Abnormal   Collection Time: 04/02/20  9:14 PM  Result Value Ref Range   Lactic Acid, Venous 2.9 (HH) 0.5 - 1.9 mmol/L  Sodium     Status: Abnormal   Collection Time: 04/02/20  9:14 PM  Result Value Ref Range   Sodium  121 (L) 135 - 145 mmol/L  Lactic acid, plasma     Status: None   Collection Time: 04/03/20 12:34 AM  Result Value Ref Range   Lactic Acid, Venous 1.6 0.5 - 1.9 mmol/L  Comprehensive metabolic panel     Status: Abnormal   Collection Time: 04/03/20  5:10 AM  Result Value Ref Range   Sodium 121 (L) 135 - 145 mmol/L   Potassium 2.6 (LL) 3.5 - 5.1 mmol/L   Chloride 78 (L) 98 - 111 mmol/L   CO2 31 22 - 32 mmol/L   Glucose, Bld 102 (H) 70 - 99 mg/dL   BUN <5 (L) 6 - 20 mg/dL   Creatinine, Ser 0.87 0.44 - 1.00 mg/dL   Calcium 8.9 8.9 - 10.3 mg/dL   Total Protein 7.0 6.5 - 8.1 g/dL   Albumin 2.9 (L) 3.5 - 5.0 g/dL   AST 89 (H) 15 - 41 U/L   ALT 32 0 - 44 U/L   Alkaline Phosphatase 78 38 - 126 U/L   Total Bilirubin 10.1 (H) 0.3 - 1.2 mg/dL   GFR calc non Af Amer >60 >60 mL/min   Anion gap 12 5 - 15  Ammonia     Status: Abnormal   Collection Time: 04/03/20  5:10 AM  Result Value Ref Range   Ammonia 51 (H) 9 - 35 umol/L  CBC with Differential/Platelet     Status: Abnormal   Collection Time: 04/03/20  5:10 AM  Result Value Ref Range   WBC 4.6 4.0 - 10.5 K/uL   RBC 1.65 (L) 3.87 - 5.11 MIL/uL   Hemoglobin 6.4 (LL) 12.0 - 15.0 g/dL   HCT 18.8 (L) 36 - 46 %   MCV 113.9 (H) 80.0 - 100.0 fL   MCH 38.8 (H) 26.0 - 34.0 pg   MCHC 34.0 30.0 - 36.0 g/dL   RDW 21.8 (H) 11.5 - 15.5 %   Platelets 77 (L) 150 - 400 K/uL   nRBC 0.0 0.0 - 0.2 %   Neutrophils Relative % 68 %   Neutro Abs 3.1 1.7 - 7.7 K/uL   Lymphocytes Relative 14 %   Lymphs Abs 0.7 0.7 - 4.0 K/uL   Monocytes Relative 16 %   Monocytes Absolute 0.7 0.1 - 1.0 K/uL   Eosinophils Relative 2 %    Eosinophils Absolute 0.1 0 - 0 K/uL   Basophils Relative 0 %   Basophils Absolute 0.0 0 - 0 K/uL   Immature Granulocytes 0 %   Abs Immature Granulocytes 0.02 0.00 - 0.07 K/uL   Target Cells PRESENT   Magnesium     Status: Abnormal   Collection Time: 04/03/20  7:28 AM  Result Value Ref Range   Magnesium 1.4 (L) 1.7 - 2.4 mg/dL  Folate     Status: None   Collection Time: 04/03/20  7:28 AM  Result Value Ref Range   Folate 8.0 >5.9 ng/mL  Iron and TIBC     Status: Abnormal   Collection Time: 04/03/20  7:28 AM  Result Value Ref Range   Iron 123 28 - 170 ug/dL   TIBC 139 (L) 250 - 450 ug/dL   Saturation Ratios 89 (H) 10.4 - 31.8 %   UIBC 16 ug/dL  Vitamin B12     Status: Abnormal   Collection Time: 04/03/20  7:28 AM  Result Value Ref Range   Vitamin B-12 1,382 (H) 180 - 914 pg/mL  Reticulocytes     Status: Abnormal   Collection Time: 04/03/20  7:28 AM  Result Value Ref Range   Retic Ct Pct 5.2 (H) 0.4 - 3.1 %   RBC. 1.78 (L) 3.87 - 5.11 MIL/uL   Retic Count, Absolute 93.1 19.0 - 186.0 K/uL   Immature Retic Fract 31.4 (H) 2.3 - 15.9 %  Type and screen [place]     Status: None (Preliminary result)   Collection Time: 04/03/20  7:28 AM  Result Value Ref Range   ABO/RH(D) B POS    Antibody Screen NEG    Sample Expiration 04/06/2020,2359    Unit Number [id number]    Blood Component Type RBC LR PHER2    Unit division 00    Status of Unit ISSUED    Transfusion Status OK TO TRANSFUSE    Crossmatch Result Compatible    Unit Number [id number]    Blood Component Type RED CELLS,LR    Unit division 00    Status of Unit ISSUED    Transfusion Status OK TO TRANSFUSE    Crossmatch Result      Compatible Performed at [facility], [place] [address], [place], [place] [address]   Prepare RBC (crossmatch)     Status: None   Collection Time: 04/03/20  7:28 AM  Result Value Ref Range   Order Confirmation      ORDER PROCESSED BY BLOOD BANK Performed at [facility], [place] [address], [place], [place] [address]   Sodium     Status: Abnormal   Collection Time: 04/03/20  7:28 AM  Result Value Ref Range   Sodium 122 (L) 135 - 145 mmol/L     CT Angio Chest PE W and/or Wo Contrast  Result Date: 04/02/2020 CLINICAL DATA:  Shortness of breath EXAM: CT ANGIOGRAPHY CHEST WITH CONTRAST TECHNIQUE: Multidetector CT imaging of the chest was performed using the standard protocol during bolus administration of intravenous contrast. Multiplanar CT image reconstructions and MIPs were obtained to evaluate the vascular anatomy. CONTRAST:  80mL OMNIPAQUE IOHEXOL 350 MG/ML SOLN COMPARISON:  Chest x-ray from earlier in the same day, CT from 08/02/2019 FINDINGS: Cardiovascular: Thoracic aorta demonstrates atherosclerotic calcifications without aneurysmal dilatation or dissection. Coronary calcifications are noted. The pulmonary artery shows a normal branching pattern. No definitive pulmonary embolism is seen. Mediastinum/Nodes: Thoracic inlet is within normal limits. No sizable hilar or mediastinal adenopathy is noted. The esophagus as visualized is within normal limits. Lungs/Pleura: Lungs are well aerated bilaterally. In the medial aspect of the right lower lobe there is again identified a partially cavitary mass lesion similar to that seen on the prior exam. It has enlarged however in maximum dimension now measuring approximately 4.5 cm increased from 3.6 cm. Given this enlargement in size this is felt to be highly suspicious for underlying neoplasm Upper Abdomen: Visualized upper abdomen demonstrates advance changes of cirrhosis. Phase of contrast limits evaluation for underlying mass lesion although the overall appearance is similar to that seen on the prior exam. Multiple gallstones are noted within the gallbladder. No complicating factors are seen. Musculoskeletal: Mild degenerative changes are noted. Mild T12 compression deformity new from the prior exam. Review of the  MIP images confirms the above findings. IMPRESSION: No evidence of pulmonary emboli. Enlarging right lower lobe medial mass lesion with cavitation highly suspicious for underlying neoplasm. Further workup is recommended. Advanced changes of cirrhosis in the liver. The timing of the contrast bolus limits evaluation for underlying mass lesion. Multiple gallstones are noted without complicating factors. Electronically Signed   By: Elta Guadeloupe  Lukens M.D.   On: 04/02/2020 16:05   DG Chest Portable 1 View  Result Date: 04/02/2020 CLINICAL DATA:  Altered mental status with shortness of breath EXAM: PORTABLE CHEST 1 VIEW COMPARISON:  August 02, 2019 chest radiograph and chest CT FINDINGS: Lungs are clear. Heart is enlarged, stable, with pulmonary vascularity normal. No adenopathy. There is aortic atherosclerosis. No bone lesions. IMPRESSION: Stable cardiomegaly.  No edema or airspace opacity. Aortic Atherosclerosis (ICD10-I70.0). Electronically Signed   By: Lowella Grip III M.D.   On: 04/02/2020 13:20   US Abdomen Limited RUQ  Result Date: 04/03/2020 CLINICAL DATA:  Jaundice. EXAM: ULTRASOUND ABDOMEN LIMITED RIGHT UPPER QUADRANT COMPARISON:  CT chest 04/02/2020. FINDINGS: Gallbladder: Gallstones measuring up to 0.9 cm noted. Gallbladder wall thickness normal. Negative Murphy sign. Common bile duct: Diameter: 5.2 mm Liver: Hyperechoic heterogeneous nodular parenchymal pattern consistent with cirrhosis. No mass lesion identified. Reversal of portal venous flow noted. Other: Mild ascites. IMPRESSION: 1. Multiple gallstones. Negative Murphy sign. No biliary distention. 2. Hyperechoic heterogeneous nodular hepatic parenchymal pattern consistent with cirrhosis. Reversal of portal venous flow noted. 3.  Mild ascites. Electronically Signed   By: Marcello Moores  Register   On: 04/03/2020 08:40    ROS:  As stated above in the HPI otherwise negative.  Blood pressure (!) 108/41, pulse 92, temperature 98.9 F (37.2 C),  temperature source Oral, resp. rate 18, height 5\' 7"  (1.702 m), weight 101.2 kg, SpO2 94 %.    PE: Gen: Oriented, but very weak HEENT:  [place]/AT, EOMI Neck: Supple, no LAD Lungs: CTA Bilaterally CV: RRR without M/G/R ABD: Soft, NTND, +BS Ext: No C/C/E  Assessment/Plan: 1) Symptomatic anemia. 2) ETOH cirrhosis. 3) History of GAVE and proximal colonic AVMs. 4) Hepatic decompensation. 5) RLL cavitary mass.   The patient is very weak.  An EGD/colonoscopy will be performed tomorrow, but it is unclear if she can tolerate the prep.  A reevaluation of her esophageal varices, GAVE, and colonic AVMS will be performed tomorrow.  Her hepatic decompensation is not clear.  She denies drinking any ETOH outside of a couple of sips of champange at her son's wedding in April this year.  Her echo this past January did not show any evidence of systolic or diastolic dysfunction.  Currently she does not display any issues with hepatic encephalopathy.  The INR is at her baseline of 1.7.  The patient will require further work up for her RLL cavitary lesion.  Plan: 1) EGD/colonoscopy tomorrow. 2) Correct hypokalemia. 3) Agree with blood transfusion. 4) Monitor liver panel. Nerea Bordenave D 04/03/2020, 1:23 PM

## 2020-04-03 NOTE — Consult Note (Signed)
NAME:  Stacey Monroe, MRN:  [id number], DOB:  [birth year], LOS: 1 ADMISSION DATE:  04/02/2020, CONSULTATION DATE:  04/03/2020 REFERRING MD:  Dr. Starla Link, CHIEF COMPLAINT:  RLL cavitation   Brief History   59 year old with 27 year pack year smoking history, former ETOH abuse (quit [id number]), and alcoholic cirrhosis admitted to [facility] with progressive dyspnea, weakness, poor PO intake, and worsening lower extremity edema found to have acute on chronic hyponatremia, hypotension, anemia, and decompensated cirrhosis. CTA PE was negative for PE, but noted enlarging RLL cavitary mass originally seen in February 2021.  PCCM consulted for further recommendations of RLL cavitary mass   History of present illness    59 year old female with 40 year pack year smoking history and alcoholic cirrhosis (quit drinking in 2011 after diagnosis) who presented on 10/6 with progressive generalized weakness, nausea, poor PO intake, and worsening dyspnea over several weeks, and worsening lower extremity edema despite reported compliance with her home diuretics over the last 2 days.  She reports she has had poor appetite since her last admission in February with unintentional weight loss from 260 to 223 lbs. She chronically reports some dyspnea but reports it has progressed to all the time now and even is down to smoking around 4 cigarettes for several weeks. Reports new dry cough for 3 days.  Denies fever/ chills (reports is always cold), chest pain, hemoptysis, vomiting, change in stools, urinary symptoms, recent sick exposures, choking episodes.  Reports she developed generalized abdominal pain overnight.  She lives at home with her significant other of 20 yrs who helps care for her.  She uses a walker at baseline.  She does not have a pulmonologist, use home oxygen, or inhalers.    On admit, she was initially hypotensive which responded to 2 doses of albumin. She has intermittently required 1-2 L of nasal cannula.  She was admitted to  [facility] for acute on chronic hyponatremia, decompensated cirrhosis with acute transaminitis and hyperbilirubinemia, and developed acute hemoglobin noted to drop 8.9-> 6.4 without active gross bleeding.  She was made NPO, GI consulted, ordered 2 units PRBC, and placed on Protonix.  She had a CTA PE which was negative for pulmonary embolus but showed enlarging right lower lobe medial mass lesion (previously 3.4 x 3.4 to now 4.5 x 3.6 cm) with cavitation highly suspicious for underlying neoplasm. She was initially found on  February 2021 admission to have a posteromedial right lower lobe cavitary mass, however she did not follow up as recommended. Pulmonary was consulted for further recommendations on RLL cavitary mass.   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 Enlarging right lower lobe medial mass lesion with cavitation highly suspicious for underlying neoplasm. Further workup is recommended. Advanced changes of cirrhosis in the liver. The timing of the contrast bolus limits evaluation for underlying mass lesion. Multiple gallstones are noted without complicating factors.  04/03/2020 [place] abd limited >>  Micro Data:  10/6 SARS2/ flu  >> neg  Antimicrobials:  none  Interim history/subjective:   Objective   Blood pressure (!) 130/53, pulse (!) 101, temperature 98.8 F (37.1 C), temperature source Oral, resp. rate 18, height 5\' 7"  (1.702 m), weight 101.2 kg, SpO2  92 %.        Intake/Output Summary (Last 24 hours) at 04/03/2020 0737 Last data filed at 04/03/2020 0502 Gross per 24 hour  Intake 0 ml  Output 500 ml  Net -500 ml   Filed Weights   04/02/20 1217  Weight: 101.2 kg    Examination: General:  Chronically ill appearing female lying in bed in NAD HEENT: MM pink/moist Neuro: Alert, oriented, MAE CV: rr, no murmur PULM:  Non labored, on room air, CTA GI: obese, +bs, soft, mild diffuse tenderness,  Extremities: warm/dry, +2 LE edema   Resolved Hospital Problem list    Assessment & Plan:   Right lower lobe cavitary mass Hypoxia (resolved, neg for PE) - enlarging since CTA PE 08/02/2019, previously 3.4 x 3.4 to now 4.5 x 3.6 cm - no infectious symptoms, afebrile, normal WBC - suspicious for malignancy given 40 year smoking hx, unintentional weight loss, acute on chronic hyponatremia (may be related to alcoholic cirrhosis) - no lymphoadenopathy  - INR 1.7, platelets 77 which increases risk of bleeding P:  Will need bronchoscopy when more stable given acute drop in Hgb Ongoing aggressive pulmonary hygiene Supplemental O2 as needed for sat goal > 92%   Remainder per primary and GI.  PCCM will continue to follow.    Best practice:  Diet: NPO per primary  Pain/Anxiety/Delirium protocol (if indicated): n/a VAP protocol (if indicated): n/a DVT prophylaxis: SCDs  GI prophylaxis: PPI BID Glucose control: trend on BMET Mobility: per primary Code Status: full  Family Communication: patient updated on plan of care Disposition: remains hemodynamically stable. Telemetry   Labs   CBC: Recent Labs  Lab 04/02/20 1221 04/03/20 0510  WBC 5.5 4.6  NEUTROABS  --  3.1  HGB 8.9* 6.4*  HCT 25.5* 18.8*  MCV 112.8* 113.9*  PLT PLATELET CLUMPS NOTED ON SMEAR, UNABLE TO ESTIMATE 77*    Basic Metabolic Panel: Recent Labs  Lab 04/02/20 1221 04/02/20 2114 04/03/20 0510  NA 121* 121* 121*  K 3.7  --  2.6*  CL 76*  --  78*  CO2 26  --  31  GLUCOSE 100*  --  102*  BUN 5*  --  <5*  CREATININE 0.97  --  0.87  CALCIUM 8.6*  --  8.9   GFR: Estimated Creatinine Clearance: 84 mL/min (by C-G formula based on SCr of 0.87 mg/dL). Recent Labs  Lab  04/02/20 1221 04/02/20 1405 04/02/20 2029 04/02/20 2114 04/03/20 0034 04/03/20 0510  WBC 5.5  --   --   --   --  4.6  LATICACIDVEN  --  5.9* 2.9* 2.9* 1.6  --     Liver Function Tests: Recent Labs  Lab 04/02/20 1221 04/03/20 0510  AST 161* 89*  ALT 45* 32  ALKPHOS 130* 78  BILITOT 9.7* 10.1*  PROT 7.9 7.0  ALBUMIN 2.3* 2.9*   No results for input(s): LIPASE, AMYLASE in the last 168 hours. Recent Labs  Lab 04/02/20 1405 04/03/20 0510  AMMONIA 38* 51*    ABG No results found for: PHART, PCO2ART, PO2ART, HCO3, TCO2, ACIDBASEDEF, O2SAT   Coagulation Profile: Recent Labs  Lab 04/02/20 1405  INR 1.7*    Cardiac Enzymes: No results for input(s): CKTOTAL, CKMB, CKMBINDEX, TROPONINI in the last 168 hours.  HbA1C: No results found for: HGBA1C  CBG: Recent Labs  Lab 04/02/20 1340  GLUCAP 99    Review of Systems:   Review of Systems  Constitutional: Positive for malaise/fatigue and weight loss. Negative  for chills and fever.  Respiratory: Positive for cough, shortness of breath and wheezing. Negative for hemoptysis and sputum production.   Cardiovascular: Positive for leg swelling. Negative for chest pain, palpitations and orthopnea.  Gastrointestinal: Positive for abdominal pain and nausea. Negative for blood in stool, diarrhea, melena and vomiting.  Genitourinary: Negative for dysuria, frequency, hematuria and urgency.  Neurological: Positive for weakness. Negative for focal weakness and loss of consciousness.   Past Medical History  She,  has a past medical history of Chronic diastolic heart failure ([place]), Chronic musculoskeletal pain, Hyperlipidemia, Hypertension, Low back pain, OSA on CPAP, Osteoarthritis, Pre-eclampsia, Sleep apnea, obstructive, Slipped cervical disc, and SOB (shortness of breath).   Surgical History    Past Surgical History:  Procedure Laterality Date  . CESAREAN SECTION    . COLONOSCOPY WITH PROPOFOL N/A 07/06/2019   Procedure:  COLONOSCOPY WITH PROPOFOL;  Surgeon: Carol Ada, MD;  Location: [place];  Service: Endoscopy;  Laterality: N/A;  . ESOPHAGOGASTRODUODENOSCOPY (EGD) WITH PROPOFOL N/A 07/06/2019   Procedure: ESOPHAGOGASTRODUODENOSCOPY (EGD) WITH PROPOFOL;  Surgeon: Carol Ada, MD;  Location: [place] [facility];  Service: Endoscopy;  Laterality: N/A;  . HOT HEMOSTASIS N/A 07/06/2019   Procedure: HOT HEMOSTASIS (ARGON PLASMA COAGULATION/BICAP);  Surgeon: Carol Ada, MD;  Location: [place];  Service: Endoscopy;  Laterality: N/A;  . POLYPECTOMY  07/06/2019   Procedure: POLYPECTOMY;  Surgeon: Carol Ada, MD;  Location: [place];  Service: Endoscopy;;  . TUBAL LIGATION       Social History   reports that she has been smoking cigarettes. She has a 30.00 pack-year smoking history. She has never used smokeless tobacco. She reports that she does not drink alcohol and does not use drugs.   Family History   Her family history is not on file.   Allergies Allergies  Allergen Reactions  . Penicillin G Hives    Did it involve swelling of the face/tongue/throat, SOB, or low BP?No Did it involve sudden or severe rash/hives, skin peeling, or any reaction on the inside of your mouth or nose? Yes Did you need to seek medical attention at a hospital or doctor's office? No When did it last happen?Child If all above answers are "NO", may proceed with cephalosporin use.     Home Medications  Prior to Admission medications   Medication Sig Start Date End Date Taking? Authorizing Provider  acetaminophen (TYLENOL) 500 MG tablet Take 500 mg by mouth every 6 (six) hours as needed for mild pain, moderate pain or headache.   Yes [provider]  furosemide (LASIX) 20 MG tablet Take 3 tablets (60 mg total) by mouth daily. Call Carol Ada, MD for refills 11/22/19 04/02/20 Yes Isaac Bliss, Rayford Halsted, MD  omeprazole (PRILOSEC) 10 MG capsule Take 10 mg by mouth daily.   Yes [provider]   spironolactone (ALDACTONE) 100 MG tablet Take 2 tablets (200 mg total) by mouth daily. Patient taking differently: Take 100 mg by mouth daily.  08/08/19  Yes Patrecia Pour, MD  Vitamin D, Ergocalciferol, (DRISDOL) 1.25 MG (50000 UNIT) CAPS capsule Take 50,000 Units by mouth every 7 (seven) days.   Yes [provider]  zolpidem (AMBIEN) 10 MG tablet Take 1 tablet (10 mg total) by mouth at bedtime. 02/29/20  Yes Isaac Bliss, Rayford Halsted, MD       Kennieth Rad, ACNP  Pulmonary & Critical Care 04/03/2020, 9:30 AM  See Shea Evans for personal pager PCCM on call pager [phone]

## 2020-04-03 NOTE — Progress Notes (Signed)
Initial Nutrition Assessment  DOCUMENTATION CODES:   Obesity unspecified  INTERVENTION:  Monitor for nutrition plan of care.   If pt is to remain NPO, recommend initiation of enteral nutrition via Cortrak/NG tube. Consider: -Osmolite 1.5 cal @ 107ml/hr, advance 12ml/hr Q4H until goal rate of 63ml/hr (1451ml) is reached  -77ml Prosource TF BID  At goal, recommended tube feeding would provide 2240 kcals, 112 grams protein, 1066ml free water    If pt's diet is advanced, recommend Ensure Enlive po TID, each supplement provides 350 kcal and 20 grams of protein    NUTRITION DIAGNOSIS:   Inadequate oral intake related to poor appetite as evidenced by per patient/family report.    GOAL:   Patient will meet greater than or equal to 90% of their needs    MONITOR:   Diet advancement, Labs, Weight trends, I & O's  REASON FOR ASSESSMENT:   Malnutrition Screening Tool    ASSESSMENT:   Pt presented with generalized weakness and SOB. Pt found to have worsening R lower lobe cavitary mass suspicious for neoplasm along with decompensated cirrhosis of liver with acute transaminitis and hyperbilirubinemia. PMH includes alcoholic cirrhosis, HTN, HLD, OA, heart failure, OSA.  Pt is to have EGD/colonscopy today per GI.   Pt reports she has had a poor appetite since her last admission in February. Unable to obtain any additional information at this time as pt asked RD to complete interview at another time.  Per wt readings, pt weighed 123 kg on 08/01/19. Pt now weighs 101.2 kg. This indicates a 17.59% wt loss x 8 months, which is significant for time frame. Suspect pt is malnourished; however, unable to diagnose at this time without nutrition-focused physical exam and/or detailed diet history.  UOP: 3247ml x24 hours I/O: -658ml since admit  Labs: Na 124 (L, trending up), Mg 1.6(L) Medications: folvite, lasix, chronulac, mvi, protonix, thiamine, drisdol, IV Mg sulfate  NUTRITION -  FOCUSED PHYSICAL EXAM:  Pt declined. Will attempt at follow-up.  Diet Order:   Diet Order            Diet NPO time specified Except for: Ice Chips, Sips with Meds  Diet effective midnight                 EDUCATION NEEDS:   Not appropriate for education at this time  Skin:  Skin Assessment: Reviewed RN Assessment  Last BM:  10/7  Height:   Ht Readings from Last 1 Encounters:  04/02/20 5\' 7"  (1.702 m)    Weight:   Wt Readings from Last 1 Encounters:  04/02/20 101.2 kg    BMI:  Body mass index is 34.93 kg/m.  Estimated Nutritional Needs:   Kcal:  2200-2400  Protein:  110-120 grams  Fluid:  >2L/d  Larkin Ina, MS, RD, LDN RD pager number and weekend/on-call pager number located in [place].

## 2020-04-03 NOTE — Progress Notes (Signed)
CRITICAL VALUE ALERT  Critical Value:  Potassium 2.6 mmol/L  Date & Time Notified:  04/03/20 @ 0608  Provider Notified: MD Shalhoub Orders Received/Actions taken: Yes, see new orders

## 2020-04-03 NOTE — Progress Notes (Signed)
HOSPITAL MEDICINE OVERNIGHT EVENT NOTE    Notified by nursing of substantial drop in hemoglobin from 8.9 yesterday evening to 6.4 this morning.  According to nursing, patient has no active gross bleeding on evaluation.  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  I have ordered that the patient be n.p.o.  I have ordered a stat type and screen.  I have crossmatched the patient for 2 units of packed red blood cells with a repeat hemoglobin and hematocrit after transfusion.  I am initiating Protonix 40 mg IV every 12 for now.  I have sent and a epic secure chat to on-call gastroenterology for evaluation this morning.  Vernelle Emerald  MD [facility]

## 2020-04-03 NOTE — Progress Notes (Signed)
New Admission Note:   Arrival Method: via stretcher from ED Mental Orientation: Alert and oriented x4 Telemetry: 5M08, CCMD notified Assessment: Completed Skin: Intact, warm and dry IV: RFA, saline locked Pain: 0/10 Tubes: None Safety Measures: Safety Fall Prevention Plan has been discussed  Admission: completed 5 [place] Orientation: Patient has been orientated to the room, unit and staff.   Family: none at bedside  Orders to be reviewed and implemented. Will continue to monitor the patient. Call light has been placed within reach and bed alarm has been activated.

## 2020-04-03 NOTE — Telephone Encounter (Signed)
Pts son is calling in stating that he would like to know the reason for the pt being sent to the hospital and what the family should be concerned about.  Pt was not able to tell them the reason for her going to the hospital.

## 2020-04-03 NOTE — Progress Notes (Signed)
Patient ID: Stacey Monroe, female   DOB: January 25, 1961, 59 y.o.   MRN: [id number]  PROGRESS NOTE    Stacey Monroe  IPJ:[id number] DOB: 15-May-1961 DOA: 04/02/2020 PCP: Isaac Bliss, Rayford Halsted, MD   Brief Narrative:   59 y.o. female with medical history significant for alcoholic cirrhosis, former alcohol user, quit after her diagnosis of cirrhosis, tobacco use disorder presented with generalized weakness and shortness of breath.  On presentation, patient was hypotensive, CT angio chest showed no evidence of pulmonary emboli but showed enlarging right lower lobe medial mass lesion with cavitation highly suspicious for underlying neoplasm along with advanced changes of cirrhosis of liver.  She was found to have acute transaminitis, T bili greater than 9. Overnight, hemoglobin dropped from 8.9-6.4 without any overt signs of bleeding.  She was started on IV Protonix.  Packed red cell transfusion was ordered.  GI was consulted.  Assessment & Plan:   Generalized weakness: Likely multifactorial secondary to hyponatremia, hypotension, cirrhosis, possible malignancy -Presented with serum sodium of 121, volume overload on exam and hypotension along with right lower lobe enlargement cavitary mass lesion -PT evaluation once stable  Acute on chronic hyponatremia -Presented with sodium of 121.  Baseline sodium around 129-130.  Probably from hypervolemia from cirrhosis of liver. -Patient was given 2 doses of IV albumin. -Diuretics could not be given because of hypovolemia. -Serum sodium still 121.  Monitor. -will start the patient on Lasix 40 mg IV every 12 hours.  Hypotension -In the setting of cirrhosis of liver and hypoalbuminemia with hypervolemia -Presents with MAP in the 50s.  Received 2 doses of IV albumin.  Blood pressure improving.  Spironolactone and Lasix held on admission.  Lactic acidosis -Probably from above.  Improved  Decompensated cirrhosis of liver with acute transaminitis and  hyperbilirubinemia -Total bili more than 9 on presentation with mildly elevated AST/ALT.  AST and ALT improving today.  Bili total 10.1.  Ammonia 51 today.  GI has been consulted.  Follow recommendations from Dr. Benson Norway. -Acute right upper quadrant ultrasound to assess gallbladder and CBD.  CT of the chest showed multiple gallstones  Hypoalbuminemia -In the setting of advanced liver disease and cirrhosis.  Treated with 2 doses of albumin.  Albumin 2.9 today.  Acute on chronic anemia -Questionable cause.  No signs of overt bleeding.  Hemoglobin dropped to 6.4 from 8.9 on presentation -Monitor H&H.  Packed red cells transfusion ordered.  Patient has been started on Protonix 40 mg IV every 12 hours.  Follow GI recommendations.  Worsening right lower lobe cavitary mass -Patient was supposed to have outpatient follow-up regarding the same but never did that.  CTA chest was negative for PE but showed worsening right lower lobe cavitary lesion.  I have consulted pulmonary.  Follow recommendations.  Tobacco use disorder -Patient was counseled regarding tobacco cessation.  Continue nicotine patch.  Generalized conditioning -PT eval once stable   DVT prophylaxis: SCDs Code Status: Full Family Communication: None at bedside Disposition Plan: Status is: Inpatient  Remains inpatient appropriate because:Inpatient level of care appropriate due to severity of illness   Dispo:  Patient From: Home  Planned Disposition: Home  expected discharge date: 2 to 3 days  medically stable for discharge: No  Consultants: GI/pulmonary Procedures: None  Antimicrobials: None   Subjective: Patient seen and examined at bedside.  Still feels weak.  Denies any overnight black or bloody stools.  Feels short of breath with minimal exertion.  No overnight fever or vomiting reported.  Poor  historian.  Objective: Vitals:   04/02/20 2039 04/03/20 0040 04/03/20 0502 04/03/20 0726  BP: (!) 108/45 (!) 111/52 (!)  134/50 (!) 130/53  Pulse: 91 89 (!) 103 (!) 101  Resp: 18 18 16 18   Temp: 98.9 F (37.2 C) 98.5 F (36.9 C) 98.8 F (37.1 C) 98.8 F (37.1 C)  TempSrc: Oral Oral Oral Oral  SpO2: 94% 98% 96% 92%  Weight:      Height:        Intake/Output Summary (Last 24 hours) at 04/03/2020 0810 Last data filed at 04/03/2020 0502 Gross per 24 hour  Intake 0 ml  Output 500 ml  Net -500 ml   Filed Weights   04/02/20 1217  Weight: 101.2 kg    Examination:  General exam: Looks chronically ill.  Older than stated age.  No distress. Eyes: Looks icteric Respiratory system: Bilateral decreased breath sounds at bases with scattered crackles Cardiovascular system: S1 & S2 heard, intermittently tachycardic Gastrointestinal system: Abdomen is distended, soft and nontender. Normal bowel sounds heard. Extremities: No cyanosis, clubbing; lower extremity edema present Central nervous system: Alert.  Poor historian.  Slow to respond to questions.  No focal neurological deficits. Moving extremities Skin: No rashes, lesions or ulcers Psychiatry: Flat affect    Data Reviewed: I have personally reviewed following labs and imaging studies  CBC: Recent Labs  Lab 04/02/20 1221 04/03/20 0510  WBC 5.5 4.6  NEUTROABS  --  3.1  HGB 8.9* 6.4*  HCT 25.5* 18.8*  MCV 112.8* 113.9*  PLT PLATELET CLUMPS NOTED ON SMEAR, UNABLE TO ESTIMATE 77*   Basic Metabolic Panel: Recent Labs  Lab 04/02/20 1221 04/02/20 2114 04/03/20 0510  NA 121* 121* 121*  K 3.7  --  2.6*  CL 76*  --  78*  CO2 26  --  31  GLUCOSE 100*  --  102*  BUN 5*  --  <5*  CREATININE 0.97  --  0.87  CALCIUM 8.6*  --  8.9   GFR: Estimated Creatinine Clearance: 84 mL/min (by C-G formula based on SCr of 0.87 mg/dL). Liver Function Tests: Recent Labs  Lab 04/02/20 1221 04/03/20 0510  AST 161* 89*  ALT 45* 32  ALKPHOS 130* 78  BILITOT 9.7* 10.1*  PROT 7.9 7.0  ALBUMIN 2.3* 2.9*   No results for input(s): LIPASE, AMYLASE in the  last 168 hours. Recent Labs  Lab 04/02/20 1405 04/03/20 0510  AMMONIA 38* 51*   Coagulation Profile: Recent Labs  Lab 04/02/20 1405  INR 1.7*   Cardiac Enzymes: No results for input(s): CKTOTAL, CKMB, CKMBINDEX, TROPONINI in the last 168 hours. BNP (last 3 results) No results for input(s): PROBNP in the last 8760 hours. HbA1C: No results for input(s): HGBA1C in the last 72 hours. CBG: Recent Labs  Lab 04/02/20 1340  GLUCAP 99   Lipid Profile: No results for input(s): CHOL, HDL, LDLCALC, TRIG, CHOLHDL, LDLDIRECT in the last 72 hours. Thyroid Function Tests: No results for input(s): TSH, T4TOTAL, FREET4, T3FREE, THYROIDAB in the last 72 hours. Anemia Panel: Recent Labs    04/03/20 0728  RETICCTPCT 5.2*   Sepsis Labs: Recent Labs  Lab 04/02/20 1405 04/02/20 2029 04/02/20 2114 04/03/20 0034  LATICACIDVEN 5.9* 2.9* 2.9* 1.6    Recent Results (from the past 240 hour(s))  Respiratory Panel by RT PCR (Flu A&B, Covid) - Nasopharyngeal Swab     Status: None   Collection Time: 04/02/20  1:31 PM   Specimen: Nasopharyngeal Swab  Result Value Ref Range  Status   SARS Coronavirus 2 by RT PCR NEGATIVE NEGATIVE Final    Comment: (NOTE) SARS-CoV-2 target nucleic acids are NOT DETECTED.  The SARS-CoV-2 RNA is generally detectable in upper respiratoy specimens during the acute phase of infection. The lowest concentration of SARS-CoV-2 viral copies this assay can detect is 131 copies/mL. A negative result does not preclude SARS-Cov-2 infection and should not be used as the sole basis for treatment or other patient management decisions. A negative result may occur with  improper specimen collection/handling, submission of specimen other than nasopharyngeal swab, presence of viral mutation(s) within the areas targeted by this assay, and inadequate number of viral copies (<131 copies/mL). A negative result must be combined with clinical observations, patient history, and  epidemiological information. The expected result is Negative.  Fact Sheet for Patients:  PinkCheek.be  Fact Sheet for Healthcare Providers:  GravelBags.it  This test is no t yet approved or cleared by the [place] FDA and  has been authorized for detection and/or diagnosis of SARS-CoV-2 by FDA under an Emergency Use Authorization (EUA). This EUA will remain  in effect (meaning this test can be used) for the duration of the COVID-19 declaration under Section 564(b)(1) of the Act, 21 U.S.C. section 360bbb-3(b)(1), unless the authorization is terminated or revoked sooner.     Influenza A by PCR NEGATIVE NEGATIVE Final   Influenza B by PCR NEGATIVE NEGATIVE Final    Comment: (NOTE) The Xpert Xpress SARS-CoV-2/FLU/RSV assay is intended as an aid in  the diagnosis of influenza from Nasopharyngeal swab specimens and  should not be used as a sole basis for treatment. Nasal washings and  aspirates are unacceptable for Xpert Xpress SARS-CoV-2/FLU/RSV  testing.  Fact Sheet for Patients: PinkCheek.be  Fact Sheet for Healthcare Providers: GravelBags.it  This test is not yet approved or cleared by the [place] FDA and  has been authorized for detection and/or diagnosis of SARS-CoV-2 by  FDA under an Emergency Use Authorization (EUA). This EUA will remain  in effect (meaning this test can be used) for the duration of the  Covid-19 declaration under Section 564(b)(1) of the Act, 21  U.S.C. section 360bbb-3(b)(1), unless the authorization is  terminated or revoked. Performed at [facility], [place] [address], [place], [place] [address]          Radiology Studies: CT Angio Chest PE W and/or Wo Contrast  Result Date: 04/02/2020 CLINICAL DATA:  Shortness of breath EXAM: CT ANGIOGRAPHY CHEST WITH CONTRAST TECHNIQUE: Multidetector CT imaging of the chest was  performed using the standard protocol during bolus administration of intravenous contrast. Multiplanar CT image reconstructions and MIPs were obtained to evaluate the vascular anatomy. CONTRAST:  53mL OMNIPAQUE IOHEXOL 350 MG/ML SOLN COMPARISON:  Chest x-ray from earlier in the same day, CT from 08/02/2019 FINDINGS: Cardiovascular: Thoracic aorta demonstrates atherosclerotic calcifications without aneurysmal dilatation or dissection. Coronary calcifications are noted. The pulmonary artery shows a normal branching pattern. No definitive pulmonary embolism is seen. Mediastinum/Nodes: Thoracic inlet is within normal limits. No sizable hilar or mediastinal adenopathy is noted. The esophagus as visualized is within normal limits. Lungs/Pleura: Lungs are well aerated bilaterally. In the medial aspect of the right lower lobe there is again identified a partially cavitary mass lesion similar to that seen on the prior exam. It has enlarged however in maximum dimension now measuring approximately 4.5 cm increased from 3.6 cm. Given this enlargement in size this is felt to be highly suspicious for underlying neoplasm Upper Abdomen: Visualized  upper abdomen demonstrates advance changes of cirrhosis. Phase of contrast limits evaluation for underlying mass lesion although the overall appearance is similar to that seen on the prior exam. Multiple gallstones are noted within the gallbladder. No complicating factors are seen. Musculoskeletal: Mild degenerative changes are noted. Mild T12 compression deformity new from the prior exam. Review of the MIP images confirms the above findings. IMPRESSION: No evidence of pulmonary emboli. Enlarging right lower lobe medial mass lesion with cavitation highly suspicious for underlying neoplasm. Further workup is recommended. Advanced changes of cirrhosis in the liver. The timing of the contrast bolus limits evaluation for underlying mass lesion. Multiple gallstones are noted without  complicating factors. Electronically Signed   By: Inez Catalina M.D.   On: 04/02/2020 16:05   DG Chest Portable 1 View  Result Date: 04/02/2020 CLINICAL DATA:  Altered mental status with shortness of breath EXAM: PORTABLE CHEST 1 VIEW COMPARISON:  August 02, 2019 chest radiograph and chest CT FINDINGS: Lungs are clear. Heart is enlarged, stable, with pulmonary vascularity normal. No adenopathy. There is aortic atherosclerosis. No bone lesions. IMPRESSION: Stable cardiomegaly.  No edema or airspace opacity. Aortic Atherosclerosis (ICD10-I70.0). Electronically Signed   By: Lowella Grip III M.D.   On: 04/02/2020 13:20        Scheduled Meds: . sodium chloride   Intravenous Once  . folic acid  1 mg Oral Daily  . furosemide  20 mg Oral Daily  . multivitamin with minerals  1 tablet Oral Daily  . nicotine  14 mg Transdermal Daily  . pantoprazole (PROTONIX) IV  40 mg Intravenous Q12H  . potassium chloride  40 mEq Oral Q4H  . spironolactone  100 mg Oral Daily  . thiamine  100 mg Oral Daily  . [START ON 04/06/2020] Vitamin D (Ergocalciferol)  50,000 Units Oral Q7 days   Continuous Infusions:        Aline August, MD [facility] 04/03/2020, 8:10 AM

## 2020-04-03 NOTE — Progress Notes (Signed)
Results for Stacey Monroe, Stacey Monroe (MRN [id number]) as of 04/03/2020 06:38  Ref. Range 04/03/2020 05:10  Hemoglobin Latest Ref Range: 12.0 - 15.0 g/dL 6.4 (LL)  Will update MD.

## 2020-04-04 ENCOUNTER — Encounter (HOSPITAL_COMMUNITY)
Admission: EM | Discharge status: Against Medical Advice | Payer: Self-pay | Source: Home / Self Care | Attending: Internal Medicine

## 2020-04-04 ENCOUNTER — Encounter (HOSPITAL_COMMUNITY): Payer: Self-pay | Admitting: Internal Medicine

## 2020-04-04 ENCOUNTER — Encounter (HOSPITAL_COMMUNITY): Payer: Self-pay | Admitting: Certified Registered Nurse Anesthetist

## 2020-04-04 DIAGNOSIS — R918 Other nonspecific abnormal finding of lung field: Secondary | ICD-10-CM

## 2020-04-04 DIAGNOSIS — R7989 Other specified abnormal findings of blood chemistry: Secondary | ICD-10-CM | POA: Diagnosis not present

## 2020-04-04 DIAGNOSIS — K7031 Alcoholic cirrhosis of liver with ascites: Secondary | ICD-10-CM | POA: Diagnosis not present

## 2020-04-04 DIAGNOSIS — R531 Weakness: Secondary | ICD-10-CM | POA: Diagnosis not present

## 2020-04-04 LAB — COMPREHENSIVE METABOLIC PANEL
ALT: 34 U/L (ref 0–44)
AST: 94 U/L — ABNORMAL HIGH (ref 15–41)
Albumin: 2.8 g/dL — ABNORMAL LOW (ref 3.5–5.0)
Alkaline Phosphatase: 70 U/L (ref 38–126)
Anion gap: 11 (ref 5–15)
BUN: 6 mg/dL (ref 6–20)
CO2: 30 mmol/L (ref 22–32)
Calcium: 8.8 mg/dL — ABNORMAL LOW (ref 8.9–10.3)
Chloride: 83 mmol/L — ABNORMAL LOW (ref 98–111)
Creatinine, Ser: 0.88 mg/dL (ref 0.44–1.00)
GFR calc non Af Amer: >60 mL/min (ref >60)
Glucose, Bld: 94 mg/dL (ref 70–99)
Potassium: 3.5 mmol/L (ref 3.5–5.1)
Sodium: 124 mmol/L — ABNORMAL LOW (ref 135–145)
Total Bilirubin: 13.3 mg/dL — ABNORMAL HIGH (ref 0.3–1.2)
Total Protein: 7.1 g/dL (ref 6.5–8.1)

## 2020-04-04 LAB — CBC WITH DIFFERENTIAL/PLATELET
Abs Immature Granulocytes: 0.03 10*3/uL (ref 0.00–0.07)
Basophils Absolute: 0 10*3/uL (ref 0.0–0.1)
Basophils Relative: 1 %
Eosinophils Absolute: 0.1 10*3/uL (ref 0.0–0.5)
Eosinophils Relative: 1 %
HCT: 24.9 % — ABNORMAL LOW (ref 36.0–46.0)
Hemoglobin: 8.7 g/dL — ABNORMAL LOW (ref 12.0–15.0)
Immature Granulocytes: 0 %
Lymphocytes Relative: 8 %
Lymphs Abs: 0.5 10*3/uL — ABNORMAL LOW (ref 0.7–4.0)
MCH: 38.2 pg — ABNORMAL HIGH (ref 26.0–34.0)
MCHC: 34.9 g/dL (ref 30.0–36.0)
MCV: 109.2 fL — ABNORMAL HIGH (ref 80.0–100.0)
Monocytes Absolute: 1.1 10*3/uL — ABNORMAL HIGH (ref 0.1–1.0)
Monocytes Relative: 16 %
Neutro Abs: 5.1 10*3/uL (ref 1.7–7.7)
Neutrophils Relative %: 74 %
Platelets: 82 10*3/uL — ABNORMAL LOW (ref 150–400)
RBC: 2.28 MIL/uL — ABNORMAL LOW (ref 3.87–5.11)
RDW: 25.2 % — ABNORMAL HIGH (ref 11.5–15.5)
WBC: 6.9 10*3/uL (ref 4.0–10.5)
nRBC: 0 % (ref 0.0–0.2)

## 2020-04-04 LAB — BPAM RBC
Blood Product Expiration Date: 202110282359
Blood Product Expiration Date: 202110282359
ISSUE DATE / TIME: 202110070959
ISSUE DATE / TIME: 202110071312
Unit Type and Rh: 7300
Unit Type and Rh: 7300

## 2020-04-04 LAB — PROTIME-INR
INR: 2 — ABNORMAL HIGH (ref 0.8–1.2)
Prothrombin Time: 22.1 seconds — ABNORMAL HIGH (ref 11.4–15.2)

## 2020-04-04 LAB — TYPE AND SCREEN
ABO/RH(D): B POS
Antibody Screen: NEGATIVE
Unit division: 0
Unit division: 0

## 2020-04-04 LAB — MAGNESIUM: Magnesium: 1.6 mg/dL — ABNORMAL LOW (ref 1.7–2.4)

## 2020-04-04 SURGERY — CANCELLED PROCEDURE

## 2020-04-04 SURGERY — ESOPHAGOGASTRODUODENOSCOPY (EGD) WITH PROPOFOL
Anesthesia: Monitor Anesthesia Care

## 2020-04-04 MED ORDER — FUROSEMIDE 10 MG/ML IJ SOLN
60.0000 mg | Freq: Two times a day (BID) | INTRAMUSCULAR | Status: DC
  Administered 2020-04-04 (×2): 60 mg via INTRAVENOUS
  Filled 2020-04-04 (×2): qty 6

## 2020-04-04 MED ORDER — MAGNESIUM SULFATE 2 GM/50ML IV SOLN
2.0000 g | Freq: Once | INTRAVENOUS | Status: AC
  Administered 2020-04-04: 2 g via INTRAVENOUS
  Filled 2020-04-04: qty 50

## 2020-04-04 SURGICAL SUPPLY — 25 items

## 2020-04-04 NOTE — Progress Notes (Signed)
Patient ID: Stacey Monroe, female   DOB: [birth year], 60 y.o.   MRN: [id number]  PROGRESS NOTE    Stacey Monroe  ACZ:[id number] DOB: 12/17/1960 DOA: 04/02/2020 PCP: Isaac Bliss, Rayford Halsted, MD   Brief Narrative:   59 y.o. female with medical history significant for alcoholic cirrhosis, former alcohol user, quit after her diagnosis of cirrhosis, tobacco use disorder presented with generalized weakness and shortness of breath.  On presentation, patient was hypotensive, CT angio chest showed no evidence of pulmonary emboli but showed enlarging right lower lobe medial mass lesion with cavitation highly suspicious for underlying neoplasm along with advanced changes of cirrhosis of liver.  She was found to have acute transaminitis, T bili greater than 9. Overnight, hemoglobin dropped from 8.9-6.4 without any overt signs of bleeding.  She was started on IV Protonix.  Packed red cell transfusion was ordered.  GI was consulted.  Assessment & Plan:   Acute on chronic hyponatremia -Presented with sodium of 121.  Baseline sodium around 129-130.  Probably from hypervolemia from cirrhosis of liver. -Patient was given 2 doses of IV albumin on presentation. Diuretics could not be given because of hypovolemia. -Serum sodium still 124 today.  Monitor. -Currently on IV Lasix and blood pressure improving.  Increase Lasix to 60 mg IV every 12 hours.  Hypotension -In the setting of cirrhosis of liver and hypoalbuminemia with hypervolemia -Presents with MAP in the 50s.  Received 2 doses of IV albumin.  Blood pressure improving.  Spironolactone and Lasix held on admission. -Subsequently Lasix has been restarted.  Spironolactone still on hold.  Lactic acidosis -Probably from above.  Improved  Decompensated cirrhosis of liver with acute transaminitis and hyperbilirubinemia -Total bili more than 9 on presentation with mildly elevated AST/ALT.   -Dr. Benson Norway following.  Total bilirubin 13.3 today.   -right upper  quadrant ultrasound showed cirrhosis with mild ascites along with multiple gallstones but negative Murphy sign and no biliary distention.    Hypoalbuminemia -In the setting of advanced liver disease and cirrhosis.  Treated with 2 doses of albumin.  Albumin 2.8 today.  Acute on chronic anemia -Questionable cause.  No signs of overt bleeding.  Hemoglobin dropped to 6.4 from 8.9 on presentation -Status post 2 units packed red cell transfusion on 04/03/2020.  Hemoglobin 8.7 today. -Monitor H&H.  Continue Protonix IV every 12 hours.  GI following.  Planning for EGD/colonoscopy.  Worsening right lower lobe cavitary mass -Patient was supposed to have outpatient follow-up regarding the same but never did that.  CTA chest was negative for PE but showed worsening right lower lobe cavitary lesion.  Pulmonary following: Patient will need tissue biopsy once more stable.  Hypomagnesemia -Replace.  Repeat a.m. labs  Hypokalemia -Replace.  Improved  Macrocytosis -Probably from alcohol abuse.  Check B12, TSH and folate levels in a.m.  Generalized weakness: Likely multifactorial secondary to hyponatremia, hypotension, cirrhosis, possible malignancy -Presented with serum sodium of 121, volume overload on exam and hypotension along with right lower lobe enlargement cavitary mass lesion -PT evaluation once stable  Tobacco use disorder -Patient was counseled regarding tobacco cessation.  Continue nicotine patch.  Generalized conditioning -PT eval once stable   DVT prophylaxis: SCDs Code Status: Full Family Communication: None at bedside Disposition Plan: Status is: Inpatient  Remains inpatient appropriate because:Inpatient level of care appropriate due to severity of illness   Dispo:  Patient From: Home  Planned Disposition: Home  expected discharge date: 2 to 3 days  medically stable for discharge: No  Consultants: GI/pulmonary Procedures: None  Antimicrobials:  None   Subjective: Patient seen and examined at bedside.  Poor historian.  No overnight fever, vomiting, black or bloody stools reported.  Still very slow to respond to questions. Objective: Vitals:   04/03/20 1336 04/03/20 1603 04/03/20 2118 04/04/20 0636  BP: (!) 107/41 (!) 123/53 (!) 123/53 (!) 119/45  Pulse: 90 92 91 (!) 103  Resp: 18 18 20 20   Temp: 97.9 F (36.6 C) 98.5 F (36.9 C) 98.6 F (37 C) 99.7 F (37.6 C)  TempSrc: Oral Oral Oral Oral  SpO2: 94% 98% 96% 95%  Weight:      Height:        Intake/Output Summary (Last 24 hours) at 04/04/2020 0752 Last data filed at 04/04/2020 0600 Gross per 24 hour  Intake 3069 ml  Output 3220 ml  Net -151 ml   Filed Weights   04/02/20 1217  Weight: 101.2 kg    Examination:  General exam: Chronically ill looking.  Older than stated age.  No acute distress Eyes: Icterus present  respiratory system: Bilateral decreased breath sounds at bases with some crackles, no wheezing Cardiovascular system: Tachycardic, S1-S2 heard Gastrointestinal system: Abdomen is distended, soft and nontender.  Bowel sounds are heard  extremities: Mild lower extremity edema present.  No clubbing Central nervous system: Extremely poor historian.  Awake but still extremely slow to respond to questions.  Slightly confused.  No focal neurological deficits.  Moves extremities Skin: No obvious ecchymosis/ulcers  psychiatry: Extremely flat affect    Data Reviewed: I have personally reviewed following labs and imaging studies  CBC: Recent Labs  Lab 04/02/20 1221 04/03/20 0510 04/03/20 1918 04/04/20 0254  WBC 5.5 4.6  --  6.9  NEUTROABS  --  3.1  --  5.1  HGB 8.9* 6.4* 9.0* 8.7*  HCT 25.5* 18.8* 26.0* 24.9*  MCV 112.8* 113.9*  --  109.2*  PLT PLATELET CLUMPS NOTED ON SMEAR, UNABLE TO ESTIMATE 77*  --  82*   Basic Metabolic Panel: Recent Labs  Lab 04/02/20 1221 04/02/20 1221 04/02/20 2114 04/03/20 0510 04/03/20 0728 04/03/20 1901  04/04/20 0254  NA 121*   < > 121* 121* 122* 122* 124*  K 3.7  --   --  2.6*  --   --  3.5  CL 76*  --   --  78*  --   --  83*  CO2 26  --   --  31  --   --  30  GLUCOSE 100*  --   --  102*  --   --  94  BUN 5*  --   --  <5*  --   --  6  CREATININE 0.97  --   --  0.87  --   --  0.88  CALCIUM 8.6*  --   --  8.9  --   --  8.8*  MG  --   --   --   --  1.4*  --  1.6*   < > = values in this interval not displayed.   GFR: Estimated Creatinine Clearance: 83.1 mL/min (by C-G formula based on SCr of 0.88 mg/dL). Liver Function Tests: Recent Labs  Lab 04/02/20 1221 04/03/20 0510 04/04/20 0254  AST 161* 89* 94*  ALT 45* 32 34  ALKPHOS 130* 78 70  BILITOT 9.7* 10.1* 13.3*  PROT 7.9 7.0 7.1  ALBUMIN 2.3* 2.9* 2.8*   No results for input(s): LIPASE, AMYLASE in the last 168 hours. Recent  Labs  Lab 04/02/20 1405 04/03/20 0510  AMMONIA 38* 51*   Coagulation Profile: Recent Labs  Lab 04/02/20 1405  INR 1.7*   Cardiac Enzymes: No results for input(s): CKTOTAL, CKMB, CKMBINDEX, TROPONINI in the last 168 hours. BNP (last 3 results) No results for input(s): PROBNP in the last 8760 hours. HbA1C: No results for input(s): HGBA1C in the last 72 hours. CBG: Recent Labs  Lab 04/02/20 1340  GLUCAP 99   Lipid Profile: No results for input(s): CHOL, HDL, LDLCALC, TRIG, CHOLHDL, LDLDIRECT in the last 72 hours. Thyroid Function Tests: No results for input(s): TSH, T4TOTAL, FREET4, T3FREE, THYROIDAB in the last 72 hours. Anemia Panel: Recent Labs    04/03/20 0728  VITAMINB12 1,382*  FOLATE 8.0  TIBC 139*  IRON 123  RETICCTPCT 5.2*   Sepsis Labs: Recent Labs  Lab 04/02/20 1405 04/02/20 2029 04/02/20 2114 04/03/20 0034  LATICACIDVEN 5.9* 2.9* 2.9* 1.6    Recent Results (from the past 240 hour(s))  Respiratory Panel by RT PCR (Flu A&B, Covid) - Nasopharyngeal Swab     Status: None   Collection Time: 04/02/20  1:31 PM   Specimen: Nasopharyngeal Swab  Result Value Ref Range  Status   SARS Coronavirus 2 by RT PCR NEGATIVE NEGATIVE Final    Comment: (NOTE) SARS-CoV-2 target nucleic acids are NOT DETECTED.  The SARS-CoV-2 RNA is generally detectable in upper respiratoy specimens during the acute phase of infection. The lowest concentration of SARS-CoV-2 viral copies this assay can detect is 131 copies/mL. A negative result does not preclude SARS-Cov-2 infection and should not be used as the sole basis for treatment or other patient management decisions. A negative result may occur with  improper specimen collection/handling, submission of specimen other than nasopharyngeal swab, presence of viral mutation(s) within the areas targeted by this assay, and inadequate number of viral copies (<131 copies/mL). A negative result must be combined with clinical observations, patient history, and epidemiological information. The expected result is Negative.  Fact Sheet for Patients:  PinkCheek.be  Fact Sheet for Healthcare Providers:  GravelBags.it  This test is no t yet approved or cleared by the [place] FDA and  has been authorized for detection and/or diagnosis of SARS-CoV-2 by FDA under an Emergency Use Authorization (EUA). This EUA will remain  in effect (meaning this test can be used) for the duration of the COVID-19 declaration under Section 564(b)(1) of the Act, 21 U.S.C. section 360bbb-3(b)(1), unless the authorization is terminated or revoked sooner.     Influenza A by PCR NEGATIVE NEGATIVE Final   Influenza B by PCR NEGATIVE NEGATIVE Final    Comment: (NOTE) The Xpert Xpress SARS-CoV-2/FLU/RSV assay is intended as an aid in  the diagnosis of influenza from Nasopharyngeal swab specimens and  should not be used as a sole basis for treatment. Nasal washings and  aspirates are unacceptable for Xpert Xpress SARS-CoV-2/FLU/RSV  testing.  Fact Sheet for  Patients: PinkCheek.be  Fact Sheet for Healthcare Providers: GravelBags.it  This test is not yet approved or cleared by the [place] FDA and  has been authorized for detection and/or diagnosis of SARS-CoV-2 by  FDA under an Emergency Use Authorization (EUA). This EUA will remain  in effect (meaning this test can be used) for the duration of the  Covid-19 declaration under Section 564(b)(1) of the Act, 21  U.S.C. section 360bbb-3(b)(1), unless the authorization is  terminated or revoked. Performed at [facility], [place] [address] [place], [place] [address]  Radiology Studies: CT Angio Chest PE W and/or Wo Contrast  Result Date: 04/02/2020 CLINICAL DATA:  Shortness of breath EXAM: CT ANGIOGRAPHY CHEST WITH CONTRAST TECHNIQUE: Multidetector CT imaging of the chest was performed using the standard protocol during bolus administration of intravenous contrast. Multiplanar CT image reconstructions and MIPs were obtained to evaluate the vascular anatomy. CONTRAST:  4mL OMNIPAQUE IOHEXOL 350 MG/ML SOLN COMPARISON:  Chest x-ray from earlier in the same day, CT from 08/02/2019 FINDINGS: Cardiovascular: Thoracic aorta demonstrates atherosclerotic calcifications without aneurysmal dilatation or dissection. Coronary calcifications are noted. The pulmonary artery shows a normal branching pattern. No definitive pulmonary embolism is seen. Mediastinum/Nodes: Thoracic inlet is within normal limits. No sizable hilar or mediastinal adenopathy is noted. The esophagus as visualized is within normal limits. Lungs/Pleura: Lungs are well aerated bilaterally. In the medial aspect of the right lower lobe there is again identified a partially cavitary mass lesion similar to that seen on the prior exam. It has enlarged however in maximum dimension now measuring approximately 4.5 cm increased from 3.6 cm. Given this enlargement in size this is  felt to be highly suspicious for underlying neoplasm Upper Abdomen: Visualized upper abdomen demonstrates advance changes of cirrhosis. Phase of contrast limits evaluation for underlying mass lesion although the overall appearance is similar to that seen on the prior exam. Multiple gallstones are noted within the gallbladder. No complicating factors are seen. Musculoskeletal: Mild degenerative changes are noted. Mild T12 compression deformity new from the prior exam. Review of the MIP images confirms the above findings. IMPRESSION: No evidence of pulmonary emboli. Enlarging right lower lobe medial mass lesion with cavitation highly suspicious for underlying neoplasm. Further workup is recommended. Advanced changes of cirrhosis in the liver. The timing of the contrast bolus limits evaluation for underlying mass lesion. Multiple gallstones are noted without complicating factors. Electronically Signed   By: Inez Catalina M.D.   On: 04/02/2020 16:05   DG Chest Portable 1 View  Result Date: 04/02/2020 CLINICAL DATA:  Altered mental status with shortness of breath EXAM: PORTABLE CHEST 1 VIEW COMPARISON:  August 02, 2019 chest radiograph and chest CT FINDINGS: Lungs are clear. Heart is enlarged, stable, with pulmonary vascularity normal. No adenopathy. There is aortic atherosclerosis. No bone lesions. IMPRESSION: Stable cardiomegaly.  No edema or airspace opacity. Aortic Atherosclerosis (ICD10-I70.0). Electronically Signed   By: Lowella Grip III M.D.   On: 04/02/2020 13:20   US Abdomen Limited RUQ  Result Date: 04/03/2020 CLINICAL DATA:  Jaundice. EXAM: ULTRASOUND ABDOMEN LIMITED RIGHT UPPER QUADRANT COMPARISON:  CT chest 04/02/2020. FINDINGS: Gallbladder: Gallstones measuring up to 0.9 cm noted. Gallbladder wall thickness normal. Negative Murphy sign. Common bile duct: Diameter: 5.2 mm Liver: Hyperechoic heterogeneous nodular parenchymal pattern consistent with cirrhosis. No mass lesion identified. Reversal  of portal venous flow noted. Other: Mild ascites. IMPRESSION: 1. Multiple gallstones. Negative Murphy sign. No biliary distention. 2. Hyperechoic heterogeneous nodular hepatic parenchymal pattern consistent with cirrhosis. Reversal of portal venous flow noted. 3.  Mild ascites. Electronically Signed   By: Marcello Moores  Register   On: 04/03/2020 08:40        Scheduled Meds: . folic acid  1 mg Oral Daily  . furosemide  40 mg Intravenous Q12H  . lactulose  10 g Oral BID  . multivitamin with minerals  1 tablet Oral Daily  . nicotine  14 mg Transdermal Daily  . pantoprazole (PROTONIX) IV  40 mg Intravenous Q12H  . thiamine  100 mg Oral Daily  . [START ON 04/06/2020]  Vitamin D (Ergocalciferol)  50,000 Units Oral Q7 days   Continuous Infusions:        Aline August, MD [facility] 04/04/2020, 7:52 AM

## 2020-04-04 NOTE — Progress Notes (Signed)
NAME:  Stacey Monroe, MRN:  [id number], DOB:  1960/07/02, LOS: 2 ADMISSION DATE:  04/02/2020, CONSULTATION DATE:  04/03/2020 REFERRING MD:  Dr. Starla Link, CHIEF COMPLAINT:  RLL cavitation   Brief History   59 year old with 21 year pack year smoking history, former ETOH abuse (quit [id number]), and alcoholic cirrhosis admitted to [facility] with progressive dyspnea, weakness, poor PO intake, and worsening lower extremity edema found to have acute on chronic hyponatremia, hypotension, anemia, and decompensated cirrhosis. CTA PE was negative for PE, but noted enlarging RLL cavitary mass originally seen in February 2021.  PCCM consulted for further recommendations of RLL cavitary mass   History of present illness    59 year old female with 40 year pack year smoking history and alcoholic cirrhosis (quit drinking in 2011 after diagnosis) who presented on 10/6 with progressive generalized weakness, nausea, poor PO intake, and worsening dyspnea over several weeks, and worsening lower extremity edema despite reported compliance with her home diuretics over the last 2 days.  She reports she has had poor appetite since her last admission in February with unintentional weight loss from 260 to 223 lbs. She chronically reports some dyspnea but reports it has progressed to all the time now and even is down to smoking around 4 cigarettes for several weeks. Reports new dry cough for 3 days.  Denies fever/ chills (reports is always cold), chest pain, hemoptysis, vomiting, change in stools, urinary symptoms, recent sick exposures, choking episodes.  Reports she developed generalized abdominal pain overnight.  She lives at home with her significant other of 20 yrs who helps care for her.  She uses a walker at baseline.  She does not have a pulmonologist, use home oxygen, or inhalers.    On admit, she was initially hypotensive which responded to 2 doses of albumin. She has intermittently required 1-2 L of nasal cannula.  She was admitted to  [facility] for acute on chronic hyponatremia, decompensated cirrhosis with acute transaminitis and hyperbilirubinemia, and developed acute hemoglobin noted to drop 8.9-> 6.4 without active gross bleeding.  She was made NPO, GI consulted, ordered 2 units PRBC, and placed on Protonix.  She had a CTA PE which was negative for pulmonary embolus but showed enlarging right lower lobe medial mass lesion (previously 3.4 x 3.4 to now 4.5 x 3.6 cm) with cavitation highly suspicious for underlying neoplasm. She was initially found on  February 2021 admission to have a posteromedial right lower lobe cavitary mass, however she did not follow up as recommended. Pulmonary was consulted for further recommendations on RLL cavitary mass.   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 Enlarging right lower lobe medial mass lesion with cavitation highly suspicious for underlying neoplasm. Further workup is recommended. Advanced changes of cirrhosis in the liver. The timing of the contrast bolus limits evaluation for underlying mass lesion. Multiple gallstones are noted without complicating factors.  04/03/2020 [place] abd limited >>  Micro Data:  10/6 SARS2/ flu  >> neg  Antimicrobials:  none  Interim history/subjective:   Objective   Blood pressure (!) 130/50, pulse 99, temperature 99.2 F (37.3 C), temperature source Oral, resp. rate 16, height 5\' 7"  (1.702 m), weight 101.2 kg, SpO2 93 %.  Intake/Output Summary (Last 24 hours) at 04/04/2020 1105 Last data filed at 04/04/2020 0900 Gross per 24 hour  Intake 3072.04 ml  Output 2770 ml  Net 302.04 ml   Filed Weights   04/02/20 1217  Weight: 101.2  kg   Examination: General: Chronically ill-appearing woman, in bed no distress HEENT: Oropharynx clear, voice strong, no stridor, scleral icterus Neuro: Awake and alert, oriented, globally weak but moves extremities CV: Regular, distant, no murmur PULM: Clear anterior laterally GI: Obese, mild diffuse tenderness, no rebound, no guarding Extremities: 2+ edema Skin: Jaundiced  Resolved Hospital Problem list    Assessment & Plan:   Right lower lobe cavitary mass, enlarged c/w 07/2019, consistent with primary lung CA Hypoxia (resolved, neg for PE) P: -Consistent with primary lung malignancy by CT appearance, given history of tobacco.  Needs biopsy for tissue diagnosis, most straightforward approach would be bronchoscopy with navigation, transbronchial biopsies.  This would need to be done under general anesthesia.  Agree that she is high risk for this currently due to her hemodynamic instability, anemia.  She has coagulopathy and thrombocytopenia which complicate things.  She will need FFP prior/on-call to any planned biopsy.  Do not want her to be lost to follow-up again, want to try to get this arranged once stabilized here.  Alternatively we would need to arrange outpatient follow-up with her in our office to assess stability and plan timing of ENB.   Best practice:  Diet: NPO per primary  Pain/Anxiety/Delirium protocol (if indicated): n/a VAP protocol (if indicated): n/a DVT prophylaxis: SCDs  GI prophylaxis: PPI BID Glucose control: trend on BMET Mobility: per primary Code Status: full  Family Communication: patient updated on plan of care Disposition: remains hemodynamically stable. Telemetry   Labs   CBC: Recent Labs  Lab 04/02/20 1221 04/03/20 0510 04/03/20 1918 04/04/20 0254  WBC 5.5 4.6  --  6.9  NEUTROABS  --  3.1  --  5.1  HGB 8.9* 6.4* 9.0* 8.7*  HCT 25.5* 18.8* 26.0* 24.9*  MCV 112.8* 113.9*  --  109.2*  PLT PLATELET CLUMPS NOTED ON SMEAR, UNABLE TO ESTIMATE  77*  --  82*    Basic Metabolic Panel: Recent Labs  Lab 04/02/20 1221 04/02/20 1221 04/02/20 2114 04/03/20 0510 04/03/20 0728 04/03/20 1901 04/04/20 0254  NA 121*   < > 121* 121* 122* 122* 124*  K 3.7  --   --  2.6*  --   --  3.5  CL 76*  --   --  78*  --   --  83*  CO2 26  --   --  31  --   --  30  GLUCOSE 100*  --   --  102*  --   --  94  BUN 5*  --   --  <5*  --   --  6  CREATININE 0.97  --   --  0.87  --   --  0.88  CALCIUM 8.6*  --   --  8.9  --   --  8.8*  MG  --   --   --   --  1.4*  --  1.6*   < > = values in this interval not displayed.   GFR: Estimated Creatinine Clearance: 83.1 mL/min (by C-G formula based on SCr of 0.88 mg/dL). Recent Labs  Lab 04/02/20 1221 04/02/20 1405 04/02/20 2029 04/02/20 2114 04/03/20 0034 04/03/20 0510 04/04/20 0254  WBC 5.5  --   --   --   --  4.6 6.9  LATICACIDVEN  --  5.9* 2.9* 2.9* 1.6  --   --     Liver Function Tests: Recent Labs  Lab 04/02/20 1221 04/03/20 0510 04/04/20 0254  AST 161* 89* 94*  ALT 45* 32 34  ALKPHOS 130* 78 70  BILITOT 9.7* 10.1* 13.3*  PROT 7.9 7.0 7.1  ALBUMIN 2.3* 2.9* 2.8*   No results for input(s): LIPASE, AMYLASE in the last 168 hours. Recent Labs  Lab 04/02/20 1405 04/03/20 0510  AMMONIA 38* 51*    ABG No results found for: PHART, PCO2ART, PO2ART, HCO3, TCO2, ACIDBASEDEF, O2SAT   Coagulation Profile: Recent Labs  Lab 04/02/20 1405  INR 1.7*    Cardiac Enzymes: No results for input(s): CKTOTAL, CKMB, CKMBINDEX, TROPONINI in the last 168 hours.  HbA1C: No results found for: HGBA1C  CBG: Recent Labs  Lab 04/02/20 [place]    Baltazar Apo, MD, PhD 04/04/2020, 11:12 AM [facility] [phone] or if no answer [phone]

## 2020-04-04 NOTE — Progress Notes (Signed)
Subjective: Feeling weak.  Objective: Vital signs in last 24 hours: Temp:  [97.9 F (36.6 C)-99.7 F (37.6 C)] 99.2 F (37.3 C) (10/08 1039) Pulse Rate:  [89-103] 99 (10/08 1039) Resp:  [16-20] 16 (10/08 1039) BP: (107-130)/(41-53) 130/50 (10/08 1039) SpO2:  [93 %-98 %] 93 % (10/08 1039) Last BM Date: 04/03/20  Intake/Output from previous day: 10/07 0701 - 10/08 0700 In: 3069 [P.O.:2260; I.V.:15; KXFGH:829; IV Piggyback:150] Out: 9371 [Urine:3220] Intake/Output this shift: Total I/O In: 50.1 [IV Piggyback:50.1] Out: 400 [Urine:400]  General appearance: fatigued, but oriented Resp: clear to auscultation bilaterally Cardio: regular rate and rhythm GI: soft, distended, nontender Extremities: 1-2+ edema  Lab Results: Recent Labs    04/02/20 1221 04/02/20 1221 04/03/20 0510 04/03/20 1918 04/04/20 0254  WBC 5.5  --  4.6  --  6.9  HGB 8.9*   < > 6.4* 9.0* 8.7*  HCT 25.5*   < > 18.8* 26.0* 24.9*  PLT PLATELET CLUMPS NOTED ON SMEAR, UNABLE TO ESTIMATE  --  77*  --  82*   < > = values in this interval not displayed.   BMET Recent Labs    04/02/20 1221 04/02/20 2114 04/03/20 0510 04/03/20 0510 04/03/20 0728 04/03/20 1901 04/04/20 0254  NA 121*   < > 121*   < > 122* 122* 124*  K 3.7  --  2.6*  --   --   --  3.5  CL 76*  --  78*  --   --   --  83*  CO2 26  --  31  --   --   --  30  GLUCOSE 100*  --  102*  --   --   --  94  BUN 5*  --  <5*  --   --   --  6  CREATININE 0.97  --  0.87  --   --   --  0.88  CALCIUM 8.6*  --  8.9  --   --   --  8.8*   < > = values in this interval not displayed.   LFT Recent Labs    04/04/20 0254  PROT 7.1  ALBUMIN 2.8*  AST 94*  ALT 34  ALKPHOS 70  BILITOT 13.3*   PT/INR Recent Labs    04/02/20 1405  LABPROT 19.0*  INR 1.7*   Hepatitis Panel No results for input(s): HEPBSAG, HCVAB, HEPAIGM, HEPBIGM in the last 72 hours. C-Diff No results for input(s): CDIFFTOX in the last 72 hours. Fecal Lactopherrin No results for  input(s): FECLLACTOFRN in the last 72 hours.  Studies/Results: CT Angio Chest PE W and/or Wo Contrast  Result Date: 04/02/2020 CLINICAL DATA:  Shortness of breath EXAM: CT ANGIOGRAPHY CHEST WITH CONTRAST TECHNIQUE: Multidetector CT imaging of the chest was performed using the standard protocol during bolus administration of intravenous contrast. Multiplanar CT image reconstructions and MIPs were obtained to evaluate the vascular anatomy. CONTRAST:  72mL OMNIPAQUE IOHEXOL 350 MG/ML SOLN COMPARISON:  Chest x-ray from earlier in the same day, CT from 08/02/2019 FINDINGS: Cardiovascular: Thoracic aorta demonstrates atherosclerotic calcifications without aneurysmal dilatation or dissection. Coronary calcifications are noted. The pulmonary artery shows a normal branching pattern. No definitive pulmonary embolism is seen. Mediastinum/Nodes: Thoracic inlet is within normal limits. No sizable hilar or mediastinal adenopathy is noted. The esophagus as visualized is within normal limits. Lungs/Pleura: Lungs are well aerated bilaterally. In the medial aspect of the right lower lobe there is again identified a partially cavitary mass lesion similar to  that seen on the prior exam. It has enlarged however in maximum dimension now measuring approximately 4.5 cm increased from 3.6 cm. Given this enlargement in size this is felt to be highly suspicious for underlying neoplasm Upper Abdomen: Visualized upper abdomen demonstrates advance changes of cirrhosis. Phase of contrast limits evaluation for underlying mass lesion although the overall appearance is similar to that seen on the prior exam. Multiple gallstones are noted within the gallbladder. No complicating factors are seen. Musculoskeletal: Mild degenerative changes are noted. Mild T12 compression deformity new from the prior exam. Review of the MIP images confirms the above findings. IMPRESSION: No evidence of pulmonary emboli. Enlarging right lower lobe medial mass lesion  with cavitation highly suspicious for underlying neoplasm. Further workup is recommended. Advanced changes of cirrhosis in the liver. The timing of the contrast bolus limits evaluation for underlying mass lesion. Multiple gallstones are noted without complicating factors. Electronically Signed   By: Inez Catalina M.D.   On: 04/02/2020 16:05   DG Chest Portable 1 View  Result Date: 04/02/2020 CLINICAL DATA:  Altered mental status with shortness of breath EXAM: PORTABLE CHEST 1 VIEW COMPARISON:  August 02, 2019 chest radiograph and chest CT FINDINGS: Lungs are clear. Heart is enlarged, stable, with pulmonary vascularity normal. No adenopathy. There is aortic atherosclerosis. No bone lesions. IMPRESSION: Stable cardiomegaly.  No edema or airspace opacity. Aortic Atherosclerosis (ICD10-I70.0). Electronically Signed   By: Lowella Grip III M.D.   On: 04/02/2020 13:20   US Abdomen Limited RUQ  Result Date: 04/03/2020 CLINICAL DATA:  Jaundice. EXAM: ULTRASOUND ABDOMEN LIMITED RIGHT UPPER QUADRANT COMPARISON:  CT chest 04/02/2020. FINDINGS: Gallbladder: Gallstones measuring up to 0.9 cm noted. Gallbladder wall thickness normal. Negative Murphy sign. Common bile duct: Diameter: 5.2 mm Liver: Hyperechoic heterogeneous nodular parenchymal pattern consistent with cirrhosis. No mass lesion identified. Reversal of portal venous flow noted. Other: Mild ascites. IMPRESSION: 1. Multiple gallstones. Negative Murphy sign. No biliary distention. 2. Hyperechoic heterogeneous nodular hepatic parenchymal pattern consistent with cirrhosis. Reversal of portal venous flow noted. 3.  Mild ascites. Electronically Signed   By: Marcello Moores  Register   On: 04/03/2020 08:40    Medications:  Scheduled: . folic acid  1 mg Oral Daily  . furosemide  60 mg Intravenous BID  . lactulose  10 g Oral BID  . multivitamin with minerals  1 tablet Oral Daily  . nicotine  14 mg Transdermal Daily  . pantoprazole (PROTONIX) IV  40 mg Intravenous  Q12H  . thiamine  100 mg Oral Daily  . [START ON 04/06/2020] Vitamin D (Ergocalciferol)  50,000 Units Oral Q7 days   Continuous:   Assessment/Plan: 1) Anemia. 2) Hepatic decompensation. 3) RLL lung mass. 4) Weakness.   I tried again to convince her to change her mind about undergoing the EGD/colonoscopy.  She drank all the prep, but refused nursing and my recommendations to undergo the procedures today.  Her HGB increased appropriately.  She is fatigued in appearance, as well as jaundiced, but she is medical stable to undergo the procedures.  Agnieszka continues to refuse the procedures to help advance her care.  She was admitted earlier this year and she made similar demands about leaving the hospital, only to be readmitted.  I was accommodating and tolerate of her decisions/actions in the past.  I will continue to provide care for her during this hospitalization, and for one month after her D/C, but she was informed to seek GI care elsewhere upon her discharge.  It is  not clear at this point if she will undergo any endoscopic work up this weekend.  As for her hepatic decompensation her TB increased.  It will be advisable to follow her INR.  Currently she does not display any issues with asterixis.  The reason for her hepatic decompensation is not clear as there is no obvious inciting factors.  Her overall prognosis is poor with her current presentation of hepatic decompensation, anemia, and the RLL mass.  Plan: 1) Supportive care. 2) Follow INR on a daily basis. 3) Dr. Havery Moros will manage the patient this weekend.  LOS: 2 days   Carmelo Reidel D 04/04/2020, 12:09 PM

## 2020-04-04 NOTE — Plan of Care (Signed)
  Problem: Education: Goal: Knowledge of General Education information will improve Description: Including pain rating scale, medication(s)/side effects and non-pharmacologic comfort measures Outcome: Progressing   Problem: Clinical Measurements: Goal: Diagnostic test results will improve Outcome: Progressing   Problem: Coping: Goal: Level of anxiety will decrease Outcome: Progressing   Problem: Elimination: Goal: Will not experience complications related to bowel motility Outcome: Progressing

## 2020-04-05 ENCOUNTER — Encounter (HOSPITAL_COMMUNITY): Payer: Self-pay | Admitting: Internal Medicine

## 2020-04-05 LAB — CBC WITH DIFFERENTIAL/PLATELET
Abs Immature Granulocytes: 0.08 10*3/uL — ABNORMAL HIGH (ref 0.00–0.07)
Basophils Absolute: 0 10*3/uL (ref 0.0–0.1)
Basophils Relative: 0 %
Eosinophils Absolute: 0.2 10*3/uL (ref 0.0–0.5)
Eosinophils Relative: 2 %
HCT: 25.9 % — ABNORMAL LOW (ref 36.0–46.0)
Hemoglobin: 8.8 g/dL — ABNORMAL LOW (ref 12.0–15.0)
Immature Granulocytes: 1 %
Lymphocytes Relative: 12 %
Lymphs Abs: 0.9 10*3/uL (ref 0.7–4.0)
MCH: 37.8 pg — ABNORMAL HIGH (ref 26.0–34.0)
MCHC: 34 g/dL (ref 30.0–36.0)
MCV: 111.2 fL — ABNORMAL HIGH (ref 80.0–100.0)
Monocytes Absolute: 1.3 10*3/uL — ABNORMAL HIGH (ref 0.1–1.0)
Monocytes Relative: 18 %
Neutro Abs: 4.7 10*3/uL (ref 1.7–7.7)
Neutrophils Relative %: 67 %
Platelets: 82 10*3/uL — ABNORMAL LOW (ref 150–400)
RBC: 2.33 MIL/uL — ABNORMAL LOW (ref 3.87–5.11)
RDW: 24 % — ABNORMAL HIGH (ref 11.5–15.5)
WBC: 7.2 10*3/uL (ref 4.0–10.5)
nRBC: 0.3 % — ABNORMAL HIGH (ref 0.0–0.2)

## 2020-04-05 LAB — PROTIME-INR
INR: 2.2 — ABNORMAL HIGH (ref 0.8–1.2)
Prothrombin Time: 23.4 seconds — ABNORMAL HIGH (ref 11.4–15.2)

## 2020-04-05 LAB — COMPREHENSIVE METABOLIC PANEL
ALT: 43 U/L (ref 0–44)
AST: 115 U/L — ABNORMAL HIGH (ref 15–41)
Albumin: 2.7 g/dL — ABNORMAL LOW (ref 3.5–5.0)
Alkaline Phosphatase: 70 U/L (ref 38–126)
Anion gap: 14 (ref 5–15)
BUN: 11 mg/dL (ref 6–20)
CO2: 29 mmol/L (ref 22–32)
Calcium: 8.7 mg/dL — ABNORMAL LOW (ref 8.9–10.3)
Chloride: 81 mmol/L — ABNORMAL LOW (ref 98–111)
Creatinine, Ser: 0.94 mg/dL (ref 0.44–1.00)
GFR, Estimated: >60 mL/min (ref >60)
Glucose, Bld: 95 mg/dL (ref 70–99)
Potassium: 3.6 mmol/L (ref 3.5–5.1)
Sodium: 124 mmol/L — ABNORMAL LOW (ref 135–145)
Total Bilirubin: 15.2 mg/dL — ABNORMAL HIGH (ref 0.3–1.2)
Total Protein: 7.5 g/dL (ref 6.5–8.1)

## 2020-04-05 LAB — GLUCOSE, CAPILLARY: Glucose-Capillary: 124 mg/dL — ABNORMAL HIGH (ref 70–99)

## 2020-04-05 LAB — VITAMIN B12: Vitamin B-12: 1414 pg/mL — ABNORMAL HIGH (ref 180–914)

## 2020-04-05 LAB — FOLATE: Folate: 7.4 ng/mL (ref >5.9)

## 2020-04-05 LAB — AMMONIA: Ammonia: 51 umol/L — ABNORMAL HIGH (ref 9–35)

## 2020-04-05 LAB — MAGNESIUM: Magnesium: 1.6 mg/dL — ABNORMAL LOW (ref 1.7–2.4)

## 2020-04-05 LAB — TSH: TSH: 6.274 u[IU]/mL — ABNORMAL HIGH (ref 0.350–4.500)

## 2020-04-05 MED ORDER — MAGNESIUM SULFATE 2 GM/50ML IV SOLN
2.0000 g | Freq: Once | INTRAVENOUS | Status: DC
  Filled 2020-04-05: qty 50

## 2020-04-05 MED ORDER — FUROSEMIDE 10 MG/ML IJ SOLN
80.0000 mg | Freq: Two times a day (BID) | INTRAMUSCULAR | Status: DC
  Filled 2020-04-05: qty 8

## 2020-04-05 NOTE — Telephone Encounter (Signed)
Patient is currently hospitalized and I am not involved in her care while she is in the hospital. If son would like updates on her care, he will need to contact current physician in charge of her medical care in the hospital. They will be able to provide updates unless the patient is unwilling to share her information with him. Thanks.

## 2020-04-05 NOTE — Progress Notes (Signed)
    Progress Note Covering for Dr. Benson Norway   Of note- patient is being discharged from Dr. Ulyses Amor practice, he said he would follow her for one more month after this hospitalization.  When I went to see the patient this morning she was fully dressed and about to get up on her walker.  She tells me she is leaving AMA.  This whole visit has been "a farce".  Tells me that she has been "lyng the whole time".  When asked details about this she is really not clear.  I did tell her that if she leaves the hospital she may die due to worsening liver failure.  She tells me she is aware of this and wants to get things in order with her family.  I suggested a consult with our palliative care team to discuss goals of care etc. but she declined.  If patient returns over the weekend please let us know, otherwise she will continue to follow with Dr. Benson Norway for the rest of this month.  Ellouise Newer, PA-C

## 2020-04-05 NOTE — Discharge Summary (Signed)
Physician Discharge Summary  Stacey Monroe IRW:[id number] DOB: 1960/12/12 DOA: 04/02/2020  PCP: Isaac Bliss, Rayford Halsted, MD  Admit date: 04/02/2020 Discharge date: 04/05/2020  Admitted From: Home Disposition: Signed out [place]   Discharge Condition: Guarded to poor CODE STATUS: Full   Brief/Interim Summary: 59 y.o.femalewith medical history significant foralcoholic cirrhosis, former alcohol user, quit after her diagnosis of cirrhosis, tobacco use disorder presented with generalized weakness and shortness of breath.  On presentation, patient was hypotensive, CT angio chest showed no evidence of pulmonary emboli but showed enlarging right lower lobe medial mass lesion with cavitation highly suspicious for underlying neoplasm along with advanced changes of cirrhosis of liver.  She was found to have acute transaminitis, T bili greater than 9. Overnight, hemoglobin dropped from 8.9-6.4 without any overt signs of bleeding.  She was started on IV Protonix.  Packed red cell transfusion was ordered.  GI was consulted.  She was also started on intravenous Lasix during the hospitalization.  GI wanted to do EGD and colonoscopy but patient refused.  Pulmonary were planning to do bronchoscopy/biopsy at some point.  On 04/05/2020, patient signed out [place] despite understanding the risks of doing so including worsening medical condition and even death.  Discharge Diagnoses:   Acute on chronic hyponatremia Hypotension: Resolved Lactic acidosis: Resolved Decompensated cirrhosis of liver with acute transaminitis and hyperbilirubinemia Hepatic encephalopathy Hypoalbuminemia Acute on chronic anemia Worsening right lower lobe cavitary mass Hypomagnesemia Hypokalemia Macrocytosis Thrombocytopenia Generalized weakness Tobacco use disorder Generalized deconditioning  Hospital course -As mentioned above in brief summary, patient was being treated with intravenous Lasix  and GI had recommended EGD/colonoscopy but patient refused.  Pulmonary was also planning to do bronchoscopy/biopsy at some point.  On 04/05/2020, patient signed out [place] despite understanding the risks of doing so including worsening medical condition and even death.  Consider home hospice if patient does not want any kind of treatments.   Discharge Instructions    Allergies  Allergen Reactions  . Penicillin G Hives    Did it involve swelling of the face/tongue/throat, SOB, or low BP?No Did it involve sudden or severe rash/hives, skin peeling, or any reaction on the inside of your mouth or nose? Yes Did you need to seek medical attention at a hospital or doctor's office? No When did it last happen?Child If all above answers are "NO", may proceed with cephalosporin use.    Consultations:  GI/pulmonary.  Palliative care evaluation was requested which is pending.   Procedures/Studies: CT Angio Chest PE W and/or Wo Contrast  Result Date: 04/02/2020 CLINICAL DATA:  Shortness of breath EXAM: CT ANGIOGRAPHY CHEST WITH CONTRAST TECHNIQUE: Multidetector CT imaging of the chest was performed using the standard protocol during bolus administration of intravenous contrast. Multiplanar CT image reconstructions and MIPs were obtained to evaluate the vascular anatomy. CONTRAST:  39mL OMNIPAQUE IOHEXOL 350 MG/ML SOLN COMPARISON:  Chest x-ray from earlier in the same day, CT from 08/02/2019 FINDINGS: Cardiovascular: Thoracic aorta demonstrates atherosclerotic calcifications without aneurysmal dilatation or dissection. Coronary calcifications are noted. The pulmonary artery shows a normal branching pattern. No definitive pulmonary embolism is seen. Mediastinum/Nodes: Thoracic inlet is within normal limits. No sizable hilar or mediastinal adenopathy is noted. The esophagus as visualized is within normal limits. Lungs/Pleura: Lungs are well aerated bilaterally. In the medial aspect of  the right lower lobe there is again identified a partially cavitary mass lesion similar to that seen on the prior exam. It has enlarged however in maximum  dimension now measuring approximately 4.5 cm increased from 3.6 cm. Given this enlargement in size this is felt to be highly suspicious for underlying neoplasm Upper Abdomen: Visualized upper abdomen demonstrates advance changes of cirrhosis. Phase of contrast limits evaluation for underlying mass lesion although the overall appearance is similar to that seen on the prior exam. Multiple gallstones are noted within the gallbladder. No complicating factors are seen. Musculoskeletal: Mild degenerative changes are noted. Mild T12 compression deformity new from the prior exam. Review of the MIP images confirms the above findings. IMPRESSION: No evidence of pulmonary emboli. Enlarging right lower lobe medial mass lesion with cavitation highly suspicious for underlying neoplasm. Further workup is recommended. Advanced changes of cirrhosis in the liver. The timing of the contrast bolus limits evaluation for underlying mass lesion. Multiple gallstones are noted without complicating factors. Electronically Signed   By: Inez Catalina M.D.   On: 04/02/2020 16:05   DG Chest Portable 1 View  Result Date: 04/02/2020 CLINICAL DATA:  Altered mental status with shortness of breath EXAM: PORTABLE CHEST 1 VIEW COMPARISON:  August 02, 2019 chest radiograph and chest CT FINDINGS: Lungs are clear. Heart is enlarged, stable, with pulmonary vascularity normal. No adenopathy. There is aortic atherosclerosis. No bone lesions. IMPRESSION: Stable cardiomegaly.  No edema or airspace opacity. Aortic Atherosclerosis (ICD10-I70.0). Electronically Signed   By: Lowella Grip III M.D.   On: 04/02/2020 13:20   US Abdomen Limited RUQ  Result Date: 04/03/2020 CLINICAL DATA:  Jaundice. EXAM: ULTRASOUND ABDOMEN LIMITED RIGHT UPPER QUADRANT COMPARISON:  CT chest 04/02/2020. FINDINGS:  Gallbladder: Gallstones measuring up to 0.9 cm noted. Gallbladder wall thickness normal. Negative Murphy sign. Common bile duct: Diameter: 5.2 mm Liver: Hyperechoic heterogeneous nodular parenchymal pattern consistent with cirrhosis. No mass lesion identified. Reversal of portal venous flow noted. Other: Mild ascites. IMPRESSION: 1. Multiple gallstones. Negative Murphy sign. No biliary distention. 2. Hyperechoic heterogeneous nodular hepatic parenchymal pattern consistent with cirrhosis. Reversal of portal venous flow noted. 3.  Mild ascites. Electronically Signed   By: Marcello Moores  Register   On: 04/03/2020 08:40       The results of significant diagnostics from this hospitalization (including imaging, microbiology, ancillary and laboratory) are listed below for reference.     Microbiology: Recent Results (from the past 240 hour(s))  Respiratory Panel by RT PCR (Flu A&B, Covid) - Nasopharyngeal Swab     Status: None   Collection Time: 04/02/20  1:31 PM   Specimen: Nasopharyngeal Swab  Result Value Ref Range Status   SARS Coronavirus 2 by RT PCR NEGATIVE NEGATIVE Final    Comment: (NOTE) SARS-CoV-2 target nucleic acids are NOT DETECTED.  The SARS-CoV-2 RNA is generally detectable in upper respiratoy specimens during the acute phase of infection. The lowest concentration of SARS-CoV-2 viral copies this assay can detect is 131 copies/mL. A negative result does not preclude SARS-Cov-2 infection and should not be used as the sole basis for treatment or other patient management decisions. A negative result may occur with  improper specimen collection/handling, submission of specimen other than nasopharyngeal swab, presence of viral mutation(s) within the areas targeted by this assay, and inadequate number of viral copies (<131 copies/mL). A negative result must be combined with clinical observations, patient history, and epidemiological information. The expected result is Negative.  Fact Sheet  for Patients:  PinkCheek.be  Fact Sheet for Healthcare Providers:  GravelBags.it  This test is no t yet approved or cleared by the [place] FDA and  has been authorized for detection  and/or diagnosis of SARS-CoV-2 by FDA under an Emergency Use Authorization (EUA). This EUA will remain  in effect (meaning this test can be used) for the duration of the COVID-19 declaration under Section 564(b)(1) of the Act, 21 U.S.C. section 360bbb-3(b)(1), unless the authorization is terminated or revoked sooner.     Influenza A by PCR NEGATIVE NEGATIVE Final   Influenza B by PCR NEGATIVE NEGATIVE Final    Comment: (NOTE) The Xpert Xpress SARS-CoV-2/FLU/RSV assay is intended as an aid in  the diagnosis of influenza from Nasopharyngeal swab specimens and  should not be used as a sole basis for treatment. Nasal washings and  aspirates are unacceptable for Xpert Xpress SARS-CoV-2/FLU/RSV  testing.  Fact Sheet for Patients: PinkCheek.be  Fact Sheet for Healthcare Providers: GravelBags.it  This test is not yet approved or cleared by the [place] FDA and  has been authorized for detection and/or diagnosis of SARS-CoV-2 by  FDA under an Emergency Use Authorization (EUA). This EUA will remain  in effect (meaning this test can be used) for the duration of the  Covid-19 declaration under Section 564(b)(1) of the Act, 21  U.S.C. section 360bbb-3(b)(1), unless the authorization is  terminated or revoked. Performed at [facility], [place] [address], [place], [place] [address]      Labs: BNP (last 3 results) Recent Labs    07/31/19 1500 04/02/20 1405  BNP 276.0* 992.4*   Basic Metabolic Panel: Recent Labs  Lab 04/02/20 1221 04/02/20 2114 04/03/20 0510 04/03/20 0728 04/03/20 1901 04/04/20 0254 04/05/20 0248  NA 121*   < > 121* 122* 122* 124* 124*  K 3.7  --   2.6*  --   --  3.5 3.6  CL 76*  --  78*  --   --  83* 81*  CO2 26  --  31  --   --  30 29  GLUCOSE 100*  --  102*  --   --  94 95  BUN 5*  --  <5*  --   --  6 11  CREATININE 0.97  --  0.87  --   --  0.88 0.94  CALCIUM 8.6*  --  8.9  --   --  8.8* 8.7*  MG  --   --   --  1.4*  --  1.6* 1.6*   < > = values in this interval not displayed.   Liver Function Tests: Recent Labs  Lab 04/02/20 1221 04/03/20 0510 04/04/20 0254 04/05/20 0248  AST 161* 89* 94* 115*  ALT 45* 32 34 43  ALKPHOS 130* 78 70 70  BILITOT 9.7* 10.1* 13.3* 15.2*  PROT 7.9 7.0 7.1 7.5  ALBUMIN 2.3* 2.9* 2.8* 2.7*   No results for input(s): LIPASE, AMYLASE in the last 168 hours. Recent Labs  Lab 04/02/20 1405 04/03/20 0510 04/05/20 0248  AMMONIA 38* 51* 51*   CBC: Recent Labs  Lab 04/02/20 1221 04/03/20 0510 04/03/20 1918 04/04/20 0254 04/05/20 0605  WBC 5.5 4.6  --  6.9 7.2  NEUTROABS  --  3.1  --  5.1 4.7  HGB 8.9* 6.4* 9.0* 8.7* 8.8*  HCT 25.5* 18.8* 26.0* 24.9* 25.9*  MCV 112.8* 113.9*  --  109.2* 111.2*  PLT PLATELET CLUMPS NOTED ON SMEAR, UNABLE TO ESTIMATE 77*  --  82* 82*   Cardiac Enzymes: No results for input(s): CKTOTAL, CKMB, CKMBINDEX, TROPONINI in the last 168 hours. BNP: Invalid input(s): POCBNP CBG: Recent Labs  Lab 04/02/20 1340 04/05/20 0649  GLUCAP 99 124*   D-Dimer No results for input(s): DDIMER in the last 72 hours. Hgb A1c No results for input(s): HGBA1C in the last 72 hours. Lipid Profile No results for input(s): CHOL, HDL, LDLCALC, TRIG, CHOLHDL, LDLDIRECT in the last 72 hours. Thyroid function studies Recent Labs    04/05/20 0248  TSH 6.274*   Anemia work up Recent Labs    04/03/20 0728 04/05/20 0248  VITAMINB12 1,382* 1,414*  FOLATE 8.0 7.4  TIBC 139*  --   IRON 123  --   RETICCTPCT 5.2*  --    Urinalysis    Component Value Date/Time   COLORURINE AMBER (A) 04/02/2020 2029   APPEARANCEUR HAZY (A) 04/02/2020 2029   LABSPEC 1.040 (H) 04/02/2020  2029   PHURINE 5.0 04/02/2020 2029   GLUCOSEU NEGATIVE 04/02/2020 2029   HGBUR NEGATIVE 04/02/2020 2029   BILIRUBINUR SMALL (A) 04/02/2020 2029   [place] NEGATIVE 04/02/2020 2029   PROTEINUR NEGATIVE 04/02/2020 2029   UROBILINOGEN 2.0 (H) 10/29/2009 2340   NITRITE POSITIVE (A) 04/02/2020 2029   LEUKOCYTESUR LARGE (A) 04/02/2020 2029   Sepsis Labs Invalid input(s): PROCALCITONIN,  WBC,  LACTICIDVEN Microbiology Recent Results (from the past 240 hour(s))  Respiratory Panel by RT PCR (Flu A&B, Covid) - Nasopharyngeal Swab     Status: None   Collection Time: 04/02/20  1:31 PM   Specimen: Nasopharyngeal Swab  Result Value Ref Range Status   SARS Coronavirus 2 by RT PCR NEGATIVE NEGATIVE Final    Comment: (NOTE) SARS-CoV-2 target nucleic acids are NOT DETECTED.  The SARS-CoV-2 RNA is generally detectable in upper respiratoy specimens during the acute phase of infection. The lowest concentration of SARS-CoV-2 viral copies this assay can detect is 131 copies/mL. A negative result does not preclude SARS-Cov-2 infection and should not be used as the sole basis for treatment or other patient management decisions. A negative result may occur with  improper specimen collection/handling, submission of specimen other than nasopharyngeal swab, presence of viral mutation(s) within the areas targeted by this assay, and inadequate number of viral copies (<131 copies/mL). A negative result must be combined with clinical observations, patient history, and epidemiological information. The expected result is Negative.  Fact Sheet for Patients:  PinkCheek.be  Fact Sheet for Healthcare Providers:  GravelBags.it  This test is no t yet approved or cleared by the [place] FDA and  has been authorized for detection and/or diagnosis of SARS-CoV-2 by FDA under an Emergency Use Authorization (EUA). This EUA will remain  in effect (meaning  this test can be used) for the duration of the COVID-19 declaration under Section 564(b)(1) of the Act, 21 U.S.C. section 360bbb-3(b)(1), unless the authorization is terminated or revoked sooner.     Influenza A by PCR NEGATIVE NEGATIVE Final   Influenza B by PCR NEGATIVE NEGATIVE Final    Comment: (NOTE) The Xpert Xpress SARS-CoV-2/FLU/RSV assay is intended as an aid in  the diagnosis of influenza from Nasopharyngeal swab specimens and  should not be used as a sole basis for treatment. Nasal washings and  aspirates are unacceptable for Xpert Xpress SARS-CoV-2/FLU/RSV  testing.  Fact Sheet for Patients: PinkCheek.be  Fact Sheet for Healthcare Providers: GravelBags.it  This test is not yet approved or cleared by the [place] FDA and  has been authorized for detection and/or diagnosis of SARS-CoV-2 by  FDA under an Emergency Use Authorization (EUA). This EUA will remain  in effect (meaning this test can be used) for the duration of the  Covid-19 declaration under Section 564(b)(1) of the Act, 21  U.S.C. section 360bbb-3(b)(1), unless the authorization is  terminated or revoked. Performed at [facility], [place] [address], [place], [place] [address]       SIGNED:   Aline August, MD  [facility] 04/05/2020, 12:49 PM

## 2020-04-05 NOTE — Progress Notes (Addendum)
Patient left AMA.   Doctor notified.

## 2020-04-07 NOTE — Telephone Encounter (Signed)
Spoke with the pts son and informed him of the message below.

## 2020-04-08 ENCOUNTER — Telehealth: Payer: Self-pay | Admitting: Internal Medicine

## 2020-04-08 NOTE — Telephone Encounter (Signed)
Transition Care Management Unsuccessful Follow-up Telephone Call  Date of discharge and from where:  04/05/2020 from [facility]   Attempts:  1st Attempt  Reason for unsuccessful TCM follow-up call:  Left voice message

## 2020-04-09 NOTE — Telephone Encounter (Signed)
Transition Care Management Follow-up Telephone Call  Date of discharge and from where: 04/05/2020 from [facility]   How have you been since you were released from the hospital? Patient states that she is doing about the same, feels the hospital did not do anything to address her needs.   Any questions or concerns? Yes, patient states that she is very upset that her PCP did not call her or stop by the hospital while she was admitted. I tried to explain to patient the rationale behind Dr. Isaac Bliss not having hospital rights. Patient states that she is still unhappy   Items Reviewed:  Did the pt receive and understand the discharge instructions provided? Yes   Medications obtained and verified? Yes   Any new allergies since your discharge? No   Dietary orders reviewed? Yes  Do you have support at home? Yes   Functional Questionnaire: (I = Independent and D = Dependent) ADLs: I  Bathing/Dressing- I  Meal Prep- I  Eating- I  Maintaining continence- I  Transferring/Ambulation- I  Managing Meds- I  Follow up appointments reviewed:   PCP Hospital f/u appt confirmed? No  Patient declined to schedule stating that she is upset that her PCP did not come visit her in the hospital. Tried to inform patient that PCP does not have hospital rights and while hospitalized patient is under the care of hospitalist. Patient states she declines to come in at this time  [facility] f/u appt confirmed? No  Patient states that has not yet scheduled with her gastroenterologist at this time   Are transportation arrangements needed? No   If their condition worsens, is the pt aware to call PCP or go to the Emergency Dept.? Yes  Was the patient provided with contact information for the PCP's office or ED? Yes  Was to pt encouraged to call back with questions or concerns? Yes

## 2020-04-10 ENCOUNTER — Other Ambulatory Visit: Payer: Self-pay | Admitting: Internal Medicine

## 2020-04-10 DIAGNOSIS — R918 Other nonspecific abnormal finding of lung field: Secondary | ICD-10-CM

## 2020-05-07 ENCOUNTER — Encounter: Payer: Self-pay | Admitting: Gastroenterology

## 2020-05-27 ENCOUNTER — Ambulatory Visit: Payer: Medicare Other | Admitting: Gastroenterology

## 2020-07-31 ENCOUNTER — Emergency Department (HOSPITAL_COMMUNITY): Payer: Medicare Other

## 2020-07-31 ENCOUNTER — Inpatient Hospital Stay (HOSPITAL_COMMUNITY)
Admission: EM | Admit: 2020-07-31 | Discharge: 2020-08-26 | DRG: 871 | Disposition: E | Payer: Medicare Other | Attending: Internal Medicine | Admitting: Internal Medicine

## 2020-07-31 ENCOUNTER — Encounter (HOSPITAL_COMMUNITY): Payer: Self-pay

## 2020-07-31 DIAGNOSIS — R6521 Severe sepsis with septic shock: Secondary | ICD-10-CM | POA: Diagnosis present

## 2020-07-31 DIAGNOSIS — D630 Anemia in neoplastic disease: Secondary | ICD-10-CM | POA: Diagnosis present

## 2020-07-31 DIAGNOSIS — J189 Pneumonia, unspecified organism: Secondary | ICD-10-CM | POA: Diagnosis present

## 2020-07-31 DIAGNOSIS — D649 Anemia, unspecified: Secondary | ICD-10-CM | POA: Diagnosis present

## 2020-07-31 DIAGNOSIS — E58 Dietary calcium deficiency: Secondary | ICD-10-CM | POA: Diagnosis present

## 2020-07-31 DIAGNOSIS — I469 Cardiac arrest, cause unspecified: Secondary | ICD-10-CM | POA: Diagnosis present

## 2020-07-31 DIAGNOSIS — G4733 Obstructive sleep apnea (adult) (pediatric): Secondary | ICD-10-CM | POA: Diagnosis present

## 2020-07-31 DIAGNOSIS — A419 Sepsis, unspecified organism: Principal | ICD-10-CM | POA: Diagnosis present

## 2020-07-31 DIAGNOSIS — E785 Hyperlipidemia, unspecified: Secondary | ICD-10-CM | POA: Diagnosis present

## 2020-07-31 DIAGNOSIS — R579 Shock, unspecified: Secondary | ICD-10-CM

## 2020-07-31 DIAGNOSIS — Z20822 Contact with and (suspected) exposure to covid-19: Secondary | ICD-10-CM | POA: Diagnosis present

## 2020-07-31 DIAGNOSIS — K7030 Alcoholic cirrhosis of liver without ascites: Secondary | ICD-10-CM | POA: Diagnosis present

## 2020-07-31 DIAGNOSIS — M545 Low back pain, unspecified: Secondary | ICD-10-CM | POA: Diagnosis present

## 2020-07-31 DIAGNOSIS — R778 Other specified abnormalities of plasma proteins: Secondary | ICD-10-CM | POA: Diagnosis present

## 2020-07-31 DIAGNOSIS — R570 Cardiogenic shock: Secondary | ICD-10-CM | POA: Diagnosis present

## 2020-07-31 DIAGNOSIS — C3431 Malignant neoplasm of lower lobe, right bronchus or lung: Secondary | ICD-10-CM | POA: Diagnosis present

## 2020-07-31 DIAGNOSIS — I110 Hypertensive heart disease with heart failure: Secondary | ICD-10-CM | POA: Diagnosis present

## 2020-07-31 DIAGNOSIS — E876 Hypokalemia: Secondary | ICD-10-CM | POA: Diagnosis present

## 2020-07-31 DIAGNOSIS — G9341 Metabolic encephalopathy: Secondary | ICD-10-CM | POA: Diagnosis present

## 2020-07-31 DIAGNOSIS — D696 Thrombocytopenia, unspecified: Secondary | ICD-10-CM | POA: Diagnosis present

## 2020-07-31 DIAGNOSIS — J80 Acute respiratory distress syndrome: Secondary | ICD-10-CM

## 2020-07-31 DIAGNOSIS — I5032 Chronic diastolic (congestive) heart failure: Secondary | ICD-10-CM | POA: Diagnosis present

## 2020-07-31 DIAGNOSIS — Z6834 Body mass index (BMI) 34.0-34.9, adult: Secondary | ICD-10-CM

## 2020-07-31 DIAGNOSIS — E162 Hypoglycemia, unspecified: Secondary | ICD-10-CM | POA: Diagnosis present

## 2020-07-31 DIAGNOSIS — Z79899 Other long term (current) drug therapy: Secondary | ICD-10-CM | POA: Diagnosis not present

## 2020-07-31 DIAGNOSIS — T68XXXA Hypothermia, initial encounter: Secondary | ICD-10-CM

## 2020-07-31 DIAGNOSIS — R680 Hypothermia, not associated with low environmental temperature: Secondary | ICD-10-CM | POA: Diagnosis present

## 2020-07-31 DIAGNOSIS — J690 Pneumonitis due to inhalation of food and vomit: Secondary | ICD-10-CM | POA: Diagnosis present

## 2020-07-31 DIAGNOSIS — I468 Cardiac arrest due to other underlying condition: Secondary | ICD-10-CM | POA: Diagnosis present

## 2020-07-31 DIAGNOSIS — Z66 Do not resuscitate: Secondary | ICD-10-CM | POA: Diagnosis not present

## 2020-07-31 DIAGNOSIS — F17210 Nicotine dependence, cigarettes, uncomplicated: Secondary | ICD-10-CM | POA: Diagnosis present

## 2020-07-31 DIAGNOSIS — K7210 Chronic hepatic failure without coma: Secondary | ICD-10-CM | POA: Diagnosis present

## 2020-07-31 DIAGNOSIS — G8929 Other chronic pain: Secondary | ICD-10-CM | POA: Diagnosis present

## 2020-07-31 DIAGNOSIS — R627 Adult failure to thrive: Secondary | ICD-10-CM | POA: Diagnosis present

## 2020-07-31 DIAGNOSIS — E869 Volume depletion, unspecified: Secondary | ICD-10-CM | POA: Diagnosis present

## 2020-07-31 DIAGNOSIS — E871 Hypo-osmolality and hyponatremia: Secondary | ICD-10-CM | POA: Diagnosis present

## 2020-07-31 DIAGNOSIS — Z880 Allergy status to penicillin: Secondary | ICD-10-CM

## 2020-07-31 DIAGNOSIS — N179 Acute kidney failure, unspecified: Secondary | ICD-10-CM | POA: Diagnosis present

## 2020-07-31 DIAGNOSIS — J9601 Acute respiratory failure with hypoxia: Secondary | ICD-10-CM | POA: Diagnosis present

## 2020-07-31 LAB — CBG MONITORING, ED
Glucose-Capillary: 150 mg/dL — ABNORMAL HIGH (ref 70–99)
Glucose-Capillary: 24 mg/dL — CL (ref 70–99)
Glucose-Capillary: 73 mg/dL (ref 70–99)
Glucose-Capillary: 101 mg/dL — ABNORMAL HIGH (ref 70–99)
Glucose-Capillary: 123 mg/dL — ABNORMAL HIGH (ref 70–99)
Glucose-Capillary: 167 mg/dL — ABNORMAL HIGH (ref 70–99)
Glucose-Capillary: 63 mg/dL — ABNORMAL LOW (ref 70–99)
Glucose-Capillary: 99 mg/dL (ref 70–99)
Glucose-Capillary: 106 mg/dL — ABNORMAL HIGH (ref 70–99)
Glucose-Capillary: 94 mg/dL (ref 70–99)

## 2020-07-31 LAB — COMPREHENSIVE METABOLIC PANEL WITH GFR
ALT: <5 U/L (ref 0–44)
AST: 56 U/L — ABNORMAL HIGH (ref 15–41)
Albumin: 1.4 g/dL — ABNORMAL LOW (ref 3.5–5.0)
Alkaline Phosphatase: 63 U/L (ref 38–126)
Anion gap: 17 — ABNORMAL HIGH (ref 5–15)
BUN: 21 mg/dL — ABNORMAL HIGH (ref 6–20)
CO2: 13 mmol/L — ABNORMAL LOW (ref 22–32)
Calcium: 7.9 mg/dL — ABNORMAL LOW (ref 8.9–10.3)
Chloride: 98 mmol/L (ref 98–111)
Creatinine, Ser: 1.71 mg/dL — ABNORMAL HIGH (ref 0.44–1.00)
GFR, Estimated: 34 mL/min — ABNORMAL LOW
Glucose, Bld: 43 mg/dL — CL (ref 70–99)
Potassium: 3.4 mmol/L — ABNORMAL LOW (ref 3.5–5.1)
Sodium: 128 mmol/L — ABNORMAL LOW (ref 135–145)
Total Bilirubin: 6.7 mg/dL — ABNORMAL HIGH (ref 0.3–1.2)
Total Protein: 5.2 g/dL — ABNORMAL LOW (ref 6.5–8.1)

## 2020-07-31 LAB — BLOOD GAS, ARTERIAL
Acid-base deficit: 20 mmol/L — ABNORMAL HIGH (ref 0.0–2.0)
Bicarbonate: 8.9 mmol/L — ABNORMAL LOW (ref 20.0–28.0)
Drawn by: 38235
FIO2: 100
MECHVT: 500 mL
O2 Saturation: 89.9 %
PEEP: 5 cmH2O
Patient temperature: 37
RATE: 20 resp/min
pCO2 arterial: 44.5 mmHg (ref 32.0–48.0)
pH, Arterial: 6.959 — CL (ref 7.350–7.450)
pO2, Arterial: 97.2 mmHg (ref 83.0–108.0)

## 2020-07-31 LAB — CBC WITH DIFFERENTIAL/PLATELET
Abs Immature Granulocytes: 0.06 K/uL (ref 0.00–0.07)
Basophils Absolute: 0 K/uL (ref 0.0–0.1)
Basophils Relative: 0 %
Eosinophils Absolute: 0.1 K/uL (ref 0.0–0.5)
Eosinophils Relative: 1 %
HCT: 24.5 % — ABNORMAL LOW (ref 36.0–46.0)
Hemoglobin: 7 g/dL — ABNORMAL LOW (ref 12.0–15.0)
Immature Granulocytes: 1 %
Lymphocytes Relative: 17 %
Lymphs Abs: 1 K/uL (ref 0.7–4.0)
MCH: 34.7 pg — ABNORMAL HIGH (ref 26.0–34.0)
MCHC: 28.6 g/dL — ABNORMAL LOW (ref 30.0–36.0)
MCV: 121.3 fL — ABNORMAL HIGH (ref 80.0–100.0)
Monocytes Absolute: 0.4 K/uL (ref 0.1–1.0)
Monocytes Relative: 6 %
Neutro Abs: 4.2 K/uL (ref 1.7–7.7)
Neutrophils Relative %: 75 %
Platelets: 80 K/uL — ABNORMAL LOW (ref 150–400)
RBC: 2.02 MIL/uL — ABNORMAL LOW (ref 3.87–5.11)
RDW: 17.3 % — ABNORMAL HIGH (ref 11.5–15.5)
WBC: 5.7 K/uL (ref 4.0–10.5)
nRBC: 2.8 % — ABNORMAL HIGH (ref 0.0–0.2)

## 2020-07-31 LAB — LACTIC ACID, PLASMA
Lactic Acid, Venous: >11 mmol/L (ref 0.5–1.9)
Lactic Acid, Venous: >11 mmol/L (ref 0.5–1.9)

## 2020-07-31 LAB — SARS CORONAVIRUS 2 BY RT PCR (HOSPITAL ORDER, PERFORMED IN ~~LOC~~ HOSPITAL LAB): SARS Coronavirus 2: NEGATIVE

## 2020-07-31 LAB — TROPONIN I (HIGH SENSITIVITY)
Troponin I (High Sensitivity): 39 ng/L — ABNORMAL HIGH (ref <18)
Troponin I (High Sensitivity): 84 ng/L — ABNORMAL HIGH (ref <18)

## 2020-07-31 LAB — PREPARE RBC (CROSSMATCH)

## 2020-07-31 MED ORDER — DEXTROSE 50 % IV SOLN
25.0000 mL | Freq: Once | INTRAVENOUS | Status: AC
  Administered 2020-07-31: 25 mL via INTRAVENOUS

## 2020-07-31 MED ORDER — DEXTROSE 50 % IV SOLN: 50.0000 mL | Freq: Once | INTRAVENOUS | Status: AC

## 2020-07-31 MED ORDER — DEXTROSE 50 % IV SOLN
INTRAVENOUS | Status: AC
  Administered 2020-07-31: 50 mL via INTRAVENOUS
  Filled 2020-07-31: qty 50

## 2020-07-31 MED ORDER — EPINEPHRINE PF 1 MG/ML IJ SOLN
INTRAVENOUS | Status: DC
  Filled 2020-07-31 (×7): qty 250

## 2020-07-31 MED ORDER — DEXTROSE-NACL 5-0.9 % IV SOLN: INTRAVENOUS | Status: DC

## 2020-07-31 MED ORDER — PROPOFOL 1000 MG/100ML IV EMUL
INTRAVENOUS | Status: AC
  Filled 2020-07-31: qty 100

## 2020-07-31 MED ORDER — EPINEPHRINE 1 MG/10ML IJ SOSY
PREFILLED_SYRINGE | INTRAMUSCULAR | Status: AC | PRN
  Administered 2020-07-31: 1 mg via INTRAVENOUS

## 2020-07-31 MED ORDER — PROPOFOL 1000 MG/100ML IV EMUL
5.0000 ug/kg/min | INTRAVENOUS | Status: DC
  Administered 2020-07-31: 5 ug/kg/min via INTRAVENOUS

## 2020-07-31 MED ORDER — SODIUM CHLORIDE 0.9 % IV SOLN
10.0000 mL/h | Freq: Once | INTRAVENOUS | Status: AC
  Administered 2020-07-31 (×2): 10 mL/h via INTRAVENOUS

## 2020-07-31 MED ORDER — EPINEPHRINE PF 1 MG/ML IJ SOLN
INTRAMUSCULAR | Status: AC
  Filled 2020-07-31: qty 4

## 2020-07-31 MED ORDER — EPINEPHRINE HCL 5 MG/250ML IV SOLN IN NS: 0.5000 ug/min | INTRAVENOUS | Status: DC

## 2020-07-31 MED ORDER — DEXTROSE 50 % IV SOLN
INTRAVENOUS | Status: AC
  Filled 2020-07-31: qty 50

## 2020-07-31 MED ORDER — NOREPINEPHRINE 4 MG/250ML-% IV SOLN
INTRAVENOUS | Status: AC
  Administered 2020-07-31: 2 ug/min via INTRAVENOUS
  Filled 2020-07-31: qty 250

## 2020-07-31 MED ORDER — DEXTROSE 50 % IV SOLN
INTRAVENOUS | Status: AC | PRN
  Administered 2020-07-31: 1 via INTRAVENOUS

## 2020-07-31 MED ORDER — EPINEPHRINE HCL 5 MG/250ML IV SOLN IN NS
0.5000 ug/min | INTRAVENOUS | Status: DC
  Filled 2020-07-31: qty 250

## 2020-07-31 MED ORDER — SODIUM CHLORIDE 0.9 % IV SOLN: 250.0000 mL | INTRAVENOUS | Status: DC

## 2020-07-31 MED ORDER — ATROPINE SULFATE 1 MG/ML IJ SOLN
INTRAMUSCULAR | Status: AC | PRN
  Administered 2020-07-31: 1 mg via INTRAVENOUS

## 2020-07-31 MED ORDER — SODIUM BICARBONATE 8.4 % IV SOLN
INTRAVENOUS | Status: AC
  Filled 2020-07-31: qty 150

## 2020-07-31 MED ORDER — DEXTROSE 50 % IV SOLN: 25.0000 g | Freq: Once | INTRAVENOUS | Status: DC

## 2020-07-31 MED ORDER — IOHEXOL 350 MG/ML SOLN
100.0000 mL | Freq: Once | INTRAVENOUS | Status: AC | PRN
  Administered 2020-07-31: 100 mL via INTRAVENOUS

## 2020-07-31 MED ORDER — SODIUM CHLORIDE 0.9 % IV SOLN
INTRAVENOUS | Status: DC
  Filled 2020-07-31 (×9): qty 250

## 2020-07-31 MED ORDER — NOREPINEPHRINE 4 MG/250ML-% IV SOLN
2.0000 ug/min | INTRAVENOUS | Status: DC
  Administered 2020-08-01: 30 ug/min via INTRAVENOUS
  Filled 2020-07-31: qty 250

## 2020-07-31 MED ORDER — STERILE WATER FOR INJECTION IV SOLN
INTRAVENOUS | Status: DC
  Filled 2020-07-31 (×2): qty 850

## 2020-07-31 MED ORDER — SODIUM CHLORIDE 0.9 % IV SOLN: INTRAVENOUS | Status: DC

## 2020-07-31 NOTE — ED Notes (Signed)
Date and time results received: 08/19/2020 0825   Test: Lactic Critical Value: >11.0  Name of Provider Notified: Eulis Foster  Orders Received? Or Actions Taken?: NA

## 2020-07-31 NOTE — ED Notes (Addendum)
1:1 with pt at this time and pt seems comfortable. BP is still low. Keeping an eye on BP and increasing drip as needed. Will continue to monitor pt.

## 2020-07-31 NOTE — ED Notes (Signed)
Pt BP is starting to trend up. Pt seems comfortable at this time and will continue to monitor for any changes.

## 2020-07-31 NOTE — ED Notes (Signed)
Pt CBG is 123

## 2020-07-31 NOTE — ED Notes (Signed)
Date and time results received: 08/11/2020 1940 (use smartphrase ".now" to insert current time)  Test: Glucose Critical Value: 32  Name of Provider Notified: Eulis Foster  Orders Received? Or Actions Taken?:

## 2020-07-31 NOTE — ED Provider Notes (Signed)
[facility] EMERGENCY DEPARTMENT Provider Note   CSN: [id number] Arrival date & time: 08/20/2020  1818     History Chief Complaint  Patient presents with  . Post CPR    Stacey Monroe is a 60 y.o. female.  HPI Patient seen by me on arrival into the emergency department, by EMS.  Patient reportedly was talking when EMS arrived at her home, then had cardiopulmonary arrest.  She received 10 minutes of CPR with chest compressions and placement of a King airway.  After recovery of spontaneous circulation she received a milligram of epinephrine because her blood pressure was low.  EMS report that once she recovered spontaneous circulation, they did not lose her pulse again.  Patient is reportedly being monitored for cancer but not being treated, and is apparently failed treatments with plans for symptomatic care.  However it was reported that she was DNR so the patient received cardiopulmonary care for cardiopulmonary arrest.  On arrival the patient is tachycardic 100-110,and hypotensive 75/50.     Level 5 caveat-intubated  Past Medical History:  Diagnosis Date  . Chronic diastolic heart failure ([place])   . Chronic musculoskeletal pain   . Hyperlipidemia   . Hypertension   . Low back pain   . OSA on CPAP   . Osteoarthritis    knees  . Pre-eclampsia   . Sleep apnea, obstructive    patient denies  . Slipped cervical disc   . SOB (shortness of breath)     Patient Active Problem List   Diagnosis Date Noted  . Right lower lobe lung mass   . Generalized weakness 04/02/2020  . Vitamin D deficiency 11/23/2019  . Orthostatic hypotension 11/14/2019  . Shortness of breath 08/02/2019  . Edema 08/02/2019  . Anemia 07/31/2019  . Hyponatremia 07/31/2019  . Hypokalemia 07/31/2019  . Decompensated hepatic cirrhosis ([place]) 07/05/2019  . Cirrhosis ([place]) 04/06/2016  . Tobacco abuse 04/06/2016  . Stress 04/06/2016    Past Surgical History:  Procedure Laterality Date  . CESAREAN SECTION     . COLONOSCOPY WITH PROPOFOL N/A 07/06/2019   Procedure: COLONOSCOPY WITH PROPOFOL;  Surgeon: Carol Ada, MD;  Location: [place];  Service: Endoscopy;  Laterality: N/A;  . ESOPHAGOGASTRODUODENOSCOPY (EGD) WITH PROPOFOL N/A 07/06/2019   Procedure: ESOPHAGOGASTRODUODENOSCOPY (EGD) WITH PROPOFOL;  Surgeon: Carol Ada, MD;  Location: [place];  Service: Endoscopy;  Laterality: N/A;  . HOT HEMOSTASIS N/A 07/06/2019   Procedure: HOT HEMOSTASIS (ARGON PLASMA COAGULATION/BICAP);  Surgeon: Carol Ada, MD;  Location: [place];  Service: Endoscopy;  Laterality: N/A;  . POLYPECTOMY  07/06/2019   Procedure: POLYPECTOMY;  Surgeon: Carol Ada, MD;  Location: [facility] ENDOSCOPY;  Service: Endoscopy;;  . TUBAL LIGATION       OB History   No obstetric history on file.     No family history on file.  Social History   Tobacco Use  . Smoking status: Current Every Day Smoker    Packs/day: 1.00    Years: 30.00    Pack years: 30.00    Types: Cigarettes  . Smokeless tobacco: Never Used  Vaping Use  . Vaping Use: Never used  Substance Use Topics  . Alcohol use: No  . Drug use: No    Home Medications Prior to Admission medications   Medication Sig Start Date End Date Taking? Authorizing Provider  acetaminophen (TYLENOL) 500 MG tablet Take 500 mg by mouth every 6 (six) hours as needed for mild pain, moderate pain or headache.    [provider]  furosemide (LASIX) 20 MG tablet Take 3 tablets (60 mg total) by mouth daily. Call Carol Ada, MD for refills 11/22/19 04/02/20  Isaac Bliss, Rayford Halsted, MD  omeprazole (PRILOSEC) 10 MG capsule Take 10 mg by mouth daily.    [provider]  spironolactone (ALDACTONE) 100 MG tablet Take 2 tablets (200 mg total) by mouth daily. Patient taking differently: Take 100 mg by mouth daily.  08/08/19   Patrecia Pour, MD  Vitamin D, Ergocalciferol, (DRISDOL) 1.25 MG (50000 UNIT) CAPS capsule Take 50,000 Units by mouth every 7 (seven) days.     [provider]  zolpidem (AMBIEN) 10 MG tablet Take 1 tablet (10 mg total) by mouth at bedtime. 02/29/20   Isaac Bliss, Rayford Halsted, MD    Allergies    Penicillin g  Review of Systems   Review of Systems  Unable to perform ROS: Intubated    Physical Exam Updated Vital Signs BP (!) 85/49   Pulse (!) 131   Temp (!) 97.5 F (36.4 C)   Resp (!) 29   Ht 5\' 7"  (1.702 m)   Wt 101.2 kg   SpO2 91%   BMI 34.94 kg/m   Physical Exam Vitals and nursing note reviewed.  Constitutional:      Appearance: She is well-developed and well-nourished.  HENT:     Head: Normocephalic and atraumatic.     Right Ear: External ear normal.     Left Ear: External ear normal.  Eyes:     Extraocular Movements: EOM normal.     Conjunctiva/sclera: Conjunctivae normal.     Pupils: Pupils are equal, round, and reactive to light.  Neck:     Trachea: Phonation normal.  Cardiovascular:     Rate and Rhythm: Regular rhythm. Tachycardia present.     Heart sounds: Normal heart sounds.  Pulmonary:     Effort: Pulmonary effort is normal. No respiratory distress.     Breath sounds: Rhonchi present. No wheezing.     Comments: Spontaneous respirations are present, on CPAP on arrival Chest:     Chest wall: No bony tenderness.  Abdominal:     General: There is no distension.     Palpations: Abdomen is soft. There is no mass.     Tenderness: There is no abdominal tenderness.  Musculoskeletal:        General: Normal range of motion.     Cervical back: Normal range of motion and neck supple.  Skin:    General: Skin is warm, dry and intact.  Neurological:     GCS: GCS eye subscore is 1. GCS verbal subscore is 1. GCS motor subscore is 1.     Motor: No abnormal muscle tone.  Psychiatric:        Mood and Affect: Mood and affect normal.     Comments: Obtunded     ED Results / Procedures / Treatments   Labs (all labs ordered are listed, but only abnormal results are displayed) Labs Reviewed  CBC  WITH DIFFERENTIAL/PLATELET - Abnormal; Notable for the following components:      Result Value   RBC 2.02 (*)    Hemoglobin 7.0 (*)    HCT 24.5 (*)    MCV 121.3 (*)    MCH 34.7 (*)    MCHC 28.6 (*)    RDW 17.3 (*)    Platelets 80 (*)    nRBC 2.8 (*)    All other components within normal limits  COMPREHENSIVE METABOLIC PANEL -  Abnormal; Notable for the following components:   Sodium 128 (*)    Potassium 3.4 (*)    CO2 13 (*)    Glucose, Bld 43 (*)    BUN 21 (*)    Creatinine, Ser 1.71 (*)    Calcium 7.9 (*)    Total Protein 5.2 (*)    Albumin 1.4 (*)    AST 56 (*)    Total Bilirubin 6.7 (*)    GFR, Estimated 34 (*)    Anion gap 17 (*)    All other components within normal limits  LACTIC ACID, PLASMA - Abnormal; Notable for the following components:   Lactic Acid, Venous >11.0 (*)    All other components within normal limits  BLOOD GAS, ARTERIAL - Abnormal; Notable for the following components:   pH, Arterial 6.959 (*)    Bicarbonate 8.9 (*)    Acid-base deficit 20.0 (*)    All other components within normal limits  CBG MONITORING, ED - Abnormal; Notable for the following components:   Glucose-Capillary 24 (*)    All other components within normal limits  CBG MONITORING, ED - Abnormal; Notable for the following components:   Glucose-Capillary 150 (*)    All other components within normal limits  TROPONIN I (HIGH SENSITIVITY) - Abnormal; Notable for the following components:   Troponin I (High Sensitivity) 39 (*)    All other components within normal limits  SARS CORONAVIRUS 2 BY RT PCR (HOSPITAL ORDER, [place] [facility])  LACTIC ACID, PLASMA  CBG MONITORING, ED  PREPARE RBC (CROSSMATCH)  TYPE AND SCREEN  TROPONIN I (HIGH SENSITIVITY)    EKG EKG Interpretation  Date/Time:  Thursday July 31 2020 18:49:01 EST Ventricular Rate:  143 PR Interval:    QRS Duration: 80 QT Interval:  276 QTC Calculation: 426 R Axis:   62 Text  Interpretation: Sinus or ectopic atrial tachycardia Low voltage, extremity leads Nonspecific repol abnormality, diffuse leads Since last tracing rate faster Otherwise no significant change Confirmed by Daleen Bo [phone]) on 08/23/2020 6:55:41 PM   Radiology DG Chest Portable 1 View  Result Date: 08/25/2020 CLINICAL DATA:  Intubated post CPR EXAM: PORTABLE CHEST 1 VIEW COMPARISON:  04/02/2020 chest radiograph. FINDINGS: Endotracheal tube tip is 4.7 cm above the carina. Enteric tube enters stomach with the tip not seen on this image. Pacer pads overlie the left chest. Stable cardiomediastinal silhouette with top-normal heart size. No pneumothorax. No significant pleural effusions. Mild curvilinear scarring versus atelectasis at the right lung base. No overt pulmonary edema. IMPRESSION: 1. Well-positioned endotracheal and enteric tubes. 2. Mild curvilinear scarring versus atelectasis at the right lung base. Electronically Signed   By: Ilona Sorrel M.D.   On: 08/18/2020 19:01    Procedures .Critical Care Performed by: Daleen Bo, MD Authorized by: Daleen Bo, MD   Critical care provider statement:    Critical care time (minutes):  125   Critical care start time:  08/13/2020 6:50 PM   Critical care end time:  08/15/2020 11:39 PM   Critical care time was exclusive of:  Separately billable procedures and treating other patients   Critical care was necessary to treat or prevent imminent or life-threatening deterioration of the following conditions:  Respiratory failure and cardiac failure   Critical care was time spent personally by me on the following activities:  Blood draw for specimens, development of treatment plan with patient or surrogate, discussions with consultants, evaluation of patient's response to treatment, examination of patient, obtaining history  from patient or surrogate, ordering and performing treatments and interventions, ordering and review of laboratory studies, pulse  oximetry, re-evaluation of patient's condition, review of old charts and ordering and review of radiographic studies     Medications Ordered in ED Medications  propofol (DIPRIVAN) 1000 MG/100ML infusion (0 mcg/kg/min  101.2 kg Intravenous Hold 08/08/2020 1906)  dextrose 5 %-0.9 % sodium chloride infusion ( Intravenous New Bag/Given 08/20/2020 1923)  EPINEPHrine (ADRENALIN) 4 mg in NS 250 mL (0.016 mg/mL) infusion    ( Intravenous Rate/Dose Change 08/16/2020 2031)  0.9 %  sodium chloride infusion (has no administration in time range)  sodium bicarbonate 150 mEq in sterile water 1,000 mL infusion (has no administration in time range)  EPINEPHrine (ADRENALIN) 1 MG/10ML injection (1 mg Intravenous Given 07/30/2020 1831)  atropine injection (1 mg Intravenous Given 08/24/2020 1832)  dextrose 50 % solution ( Intravenous Canceled Entry 08/22/2020 1845)  dextrose 50 % solution 25 mL (25 mLs Intravenous Given by Other 08/21/2020 1917)    ED Course  I have reviewed the triage vital signs and the nursing notes.  Pertinent labs & imaging results that were available during my care of the patient were reviewed by me and considered in my medical decision making (see chart for details).  Clinical Course as of 08/16/2020 2338  Thu Jul 31, 2020  1859 Initial resuscitation, IV fluids, IV access, IV epinephrine and atropine, IV glucose. [EW]  2008 At this time she has produced only a little bit of urine in the tube.  No urine in the urinary collection bag.  IV fluids continuing, she will likely need at least 5 L.  Still on epinephrine drip with low blood pressure. [EW]  2034 Peep/cpap: 5.0 I have updated the patient's son Frederico Hamman on the current status.  He states that patient makes her own decisions.  Patient lives with a partner who she has been with for 18 years.  Frederico Hamman does not think that she intentionally harmed herself.  His only concern that he knew about prior to my calling him was that she had fallen and that her partner found  her on the floor. [EW]  2139 She remains on epinephrine pressor, currently at 20 mics per minute.  Continues to receive IV fluids.  We now have 3 peripheral lines for access and the I/O, which is not currently being used [EW]  2322 Blood pressure currently 105/30 and heart rate is 130.  Patient is not resisting treatments at this time.  She has not a propofol drip.  She just returned from CT imaging. [EW]  2322 Patient son Frederico Hamman is now in the room.  He was updated on findings and plan. [EW]    Clinical Course User Index [EW] Daleen Bo, MD   MDM Rules/Calculators/A&P                           Patient Vitals for the past 24 hrs:  BP Temp Pulse Resp SpO2 Height Weight  08/16/2020 2025 (!) 85/49 (!) 97.5 F (36.4 C) (!) 131 (!) 29 91 % -- --  08/02/2020 2020 (!) 84/50 (!) 97.5 F (36.4 C) (!) 131 (!) 28 91 % -- --  07/30/2020 2010 (!) 81/56 (!) 97.5 F (36.4 C) (!) 133 (!) 28 94 % -- --  08/20/2020 1955 (!) 104/56 97.7 F (36.5 C) (!) 130 (!) 28 97 % -- --  08/02/2020 1950 (!) 86/51 97.7 F (36.5 C) (!) 130 (!) 27  94 % -- --  08/17/2020 1945 (!) 83/42 97.9 F (36.6 C) (!) 129 (!) 25 96 % -- --  08/02/2020 1930 (!) 77/33 97.9 F (36.6 C) (!) 131 (!) 25 100 % -- --  08/16/2020 1915 (!) 81/48 98.1 F (36.7 C) 70 (!) 24 100 % -- --  08/05/2020 1910 (!) 79/46 98.1 F (36.7 C) (!) 132 (!) 24 100 % -- --  08/05/2020 1905 (!) 86/40 98.1 F (36.7 C) -- (!) 24 -- -- --  07/30/2020 1900 (!) 80/43 97.7 F (36.5 C) (!) 136 (!) 24 100 % -- --  08/25/2020 1855 (!) 84/44 (!) 97 F (36.1 C) -- (!) 23 -- -- --  08/16/2020 1852 (!) 87/42 (!) 95.9 F (35.5 C) (!) 140 (!) 23 (!) 78 % -- --  08/23/2020 1850 -- -- -- -- -- -- 101.2 kg  08/03/2020 1850 (!) 93/38 (!) 95 F (35 C) (!) 142 20 (!) 86 % -- --  08/08/2020 1845 (!) 144/118 -- (!) 145 (!) 21 (!) 89 % 5\' 7"  (1.702 m) --  08/24/2020 1840 (!) 154/59 -- (!) 152 (!) 21 100 % -- --  08/14/2020 1839 (!) 167/77 -- -- -- -- -- --  08/24/2020 1837 (!) 173/74 -- (!) 156 20 99 % --  --  08/17/2020 1836 (!) 167/83 -- (!) 157 20 98 % -- --  08/04/2020 1835 -- -- (!) 158 (!) 21 97 % -- --  07/30/2020 1833 (!) 188/87 -- -- (!) 26 -- -- --  07/29/2020 1829 -- -- -- (!) 25 -- -- --      Medical Decision Making:  This patient is presenting for evaluation of cardiopulmonary arrest, which does require a range of treatment options, and is a complaint that involves a high risk of morbidity and mortality. The differential diagnoses include acute lung process, acute heart process, metabolic disorder, sepsis. I decided to review old records, and in summary elderly female with end-stage liver disease and lung cancer, not currently being aggressively treated, presenting with cardiopulmonary arrest.  I obtained additional historical information from son, Frederico Hamman on the telephone.  Clinical Laboratory Tests Ordered, included CBC, Metabolic panel, Urinalysis and ABG, Covid test, lactate, troponin,. Review indicates hemoglobin 7, elevated MCV, Covid negative, sodium low, potassium low, CO2 low, glucose low, BUN high, creatinine high, calcium low, total protein low, albumin low, AST high, total bilirubin high, GFR low, anion gap elevated, troponin high, lactate high. Radiologic Tests Ordered, included chest x-ray post intubation.  I independently Visualized: Right images, which show ET tube appropriately placed, NG tube in stomach, no pulmonary infiltrate or edema  Cardiac Monitor Tracing which shows sinus tachycardia     Critical Interventions-clinical evaluation, intubation for airway control, laboratory testing, glucose treatment, IV fluid treatment, epinephrine for hypotension, atropine for a period of bradycardia, repeat glucose treatment, observation, reassessment  After These Interventions, the Patient was reevaluated and was found with persistent hypotension, tachycardia, with anemia, and hypoglycemia.  Patient also hypothermic.  Record review indicates that she saw her oncologist, 2 days  ago.  At that time she complained of insomnia, and peripheral edema.  It was noted that the oncologist felt the patient could not tolerate additional chemotherapy and radiation.  Patient was agreeable to that plan.  She is being followed and treated for right lower lung non-small cell carcinoma.  Hospice discussion was initiated but not concluded.  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  She has advanced cirrhosis.  Patient had  a cardiopulmonary arrest, most likely related to volume depletion and/or hypoglycemia.  She is critically ill with multiorgan failure including anemia.  CT head and cervical spine do not indicate injuries, or acute CNS abnormalities.  CT angiogram chest, does not reveal PEs, but does show right lower lung mass consistent with known cancer.  Bilateral pulmonary base consolidations are nonspecific.  The patient is noted to have a lactate>4. With the current information available to me, I don't think the patient is in septic shock. The lactate>4, is related to OTHER SHOCK Respiratory arrest and hypoglycemia.  CRITICAL CARE-yes Performed by: Daleen Bo  Nursing Notes Reviewed/ Care Coordinated Applicable Imaging Reviewed Interpretation of Laboratory Data incorporated into ED treatment   9:45 PM-requested callback from intensivist for admission and support, suggestions.  9:55 PM-case discussed with intensivist who recommends CTA chest for PE.  Also sent for CT head and cervical spine.  Dr. Wonda Amis [facility] patient at [facility] to medical ICU.  Plan-transfer to [facility], when an ICU bed is ready.  Final Clinical Impression(s) / ED Diagnoses Final diagnoses:  Cardiopulmonary arrest ([place])  Anemia, unspecified type  Acute kidney injury ([place])  Hypoglycemia  Hypothermia, initial encounter    Rx / DC Orders ED Discharge Orders    None       Daleen Bo, MD 08/16/2020 2339

## 2020-07-31 NOTE — Code Documentation (Signed)
Pt intubated with 7.5 ET tube, 21 at lip by Dr Eulis Foster without difficulty x 1 attempt.

## 2020-07-31 NOTE — ED Notes (Signed)
Date and time results received: 08/23/2020 2029  Test: ABG pH Critical Value: 6.959  Name of Provider Notified: Dr. Eulis Foster  Orders Received? Or Actions Taken?: n/a

## 2020-07-31 NOTE — ED Triage Notes (Signed)
EMS called out for post fall, pt alert and oriented when EMS arrived, pt went into respiratory arrest then cardiac arrest. CPR started and given for 10 minutes, 1 epi given for hypotension, 12.5 of d50 given for CBG 65, ROSC. King airway in place. Pt continues with pulses. 1000 ml fluids through IO.

## 2020-08-01 ENCOUNTER — Other Ambulatory Visit (HOSPITAL_COMMUNITY): Payer: Medicare Other

## 2020-08-01 ENCOUNTER — Inpatient Hospital Stay (HOSPITAL_COMMUNITY): Payer: Medicare Other

## 2020-08-01 DIAGNOSIS — I469 Cardiac arrest, cause unspecified: Secondary | ICD-10-CM | POA: Diagnosis not present

## 2020-08-01 DIAGNOSIS — R6521 Severe sepsis with septic shock: Secondary | ICD-10-CM

## 2020-08-01 DIAGNOSIS — A419 Sepsis, unspecified organism: Principal | ICD-10-CM

## 2020-08-01 LAB — POCT I-STAT 7, (LYTES, BLD GAS, ICA,H+H)
Acid-base deficit: 24 mmol/L — ABNORMAL HIGH (ref 0.0–2.0)
Acid-base deficit: 20 mmol/L — ABNORMAL HIGH (ref 0.0–2.0)
Acid-base deficit: 14 mmol/L — ABNORMAL HIGH (ref 0.0–2.0)
Acid-base deficit: 23 mmol/L — ABNORMAL HIGH (ref 0.0–2.0)
Bicarbonate: 7.2 mmol/L — ABNORMAL LOW (ref 20.0–28.0)
Bicarbonate: 9.3 mmol/L — ABNORMAL LOW (ref 20.0–28.0)
Bicarbonate: 14 mmol/L — ABNORMAL LOW (ref 20.0–28.0)
Bicarbonate: 6.1 mmol/L — ABNORMAL LOW (ref 20.0–28.0)
Calcium, Ion: 1 mmol/L — ABNORMAL LOW (ref 1.15–1.40)
Calcium, Ion: 0.93 mmol/L — ABNORMAL LOW (ref 1.15–1.40)
Calcium, Ion: 0.97 mmol/L — ABNORMAL LOW (ref 1.15–1.40)
Calcium, Ion: 0.87 mmol/L — CL (ref 1.15–1.40)
HCT: 29 % — ABNORMAL LOW (ref 36.0–46.0)
HCT: 28 % — ABNORMAL LOW (ref 36.0–46.0)
HCT: 21 % — ABNORMAL LOW (ref 36.0–46.0)
HCT: 21 % — ABNORMAL LOW (ref 36.0–46.0)
Hemoglobin: 9.9 g/dL — ABNORMAL LOW (ref 12.0–15.0)
Hemoglobin: 9.5 g/dL — ABNORMAL LOW (ref 12.0–15.0)
Hemoglobin: 7.1 g/dL — ABNORMAL LOW (ref 12.0–15.0)
Hemoglobin: 7.1 g/dL — ABNORMAL LOW (ref 12.0–15.0)
O2 Saturation: 74 %
O2 Saturation: 80 %
O2 Saturation: 87 %
O2 Saturation: 85 %
Patient temperature: 36.5
Patient temperature: 36.6
Patient temperature: 36.2
Patient temperature: 36
Potassium: 3.7 mmol/L (ref 3.5–5.1)
Potassium: 3.9 mmol/L (ref 3.5–5.1)
Potassium: 3.3 mmol/L — ABNORMAL LOW (ref 3.5–5.1)
Potassium: 5.1 mmol/L (ref 3.5–5.1)
Sodium: 129 mmol/L — ABNORMAL LOW (ref 135–145)
Sodium: 132 mmol/L — ABNORMAL LOW (ref 135–145)
Sodium: 136 mmol/L (ref 135–145)
Sodium: 135 mmol/L (ref 135–145)
TCO2: 8 mmol/L — ABNORMAL LOW (ref 22–32)
TCO2: 10 mmol/L — ABNORMAL LOW (ref 22–32)
TCO2: 15 mmol/L — ABNORMAL LOW (ref 22–32)
TCO2: 7 mmol/L — ABNORMAL LOW (ref 22–32)
pCO2 arterial: 33.2 mmHg (ref 32.0–48.0)
pCO2 arterial: 33 mmHg (ref 32.0–48.0)
pCO2 arterial: 37.7 mmHg (ref 32.0–48.0)
pCO2 arterial: 23.8 mmHg — ABNORMAL LOW (ref 32.0–48.0)
pH, Arterial: 6.941 — CL (ref 7.350–7.450)
pH, Arterial: 7.055 — CL (ref 7.350–7.450)
pH, Arterial: 7.174 — CL (ref 7.350–7.450)
pH, Arterial: 7.007 — CL (ref 7.350–7.450)
pO2, Arterial: 60 mmHg — ABNORMAL LOW (ref 83.0–108.0)
pO2, Arterial: 60 mmHg — ABNORMAL LOW (ref 83.0–108.0)
pO2, Arterial: 64 mmHg — ABNORMAL LOW (ref 83.0–108.0)
pO2, Arterial: 70 mmHg — ABNORMAL LOW (ref 83.0–108.0)

## 2020-08-01 LAB — BLOOD CULTURE ID PANEL (REFLEXED) - BCID2

## 2020-08-01 LAB — TYPE AND SCREEN
ABO/RH(D): B POS
ABO/RH(D): B POS
Antibody Screen: NEGATIVE
Antibody Screen: NEGATIVE
Unit division: 0
Unit division: 0

## 2020-08-01 LAB — CBC WITH DIFFERENTIAL/PLATELET
Abs Immature Granulocytes: 0.02 10*3/uL (ref 0.00–0.07)
Basophils Absolute: 0 10*3/uL (ref 0.0–0.1)
Basophils Relative: 1 %
Eosinophils Absolute: 0 10*3/uL (ref 0.0–0.5)
Eosinophils Relative: 1 %
HCT: 32 % — ABNORMAL LOW (ref 36.0–46.0)
Hemoglobin: 9.8 g/dL — ABNORMAL LOW (ref 12.0–15.0)
Immature Granulocytes: 1 %
Lymphocytes Relative: 26 %
Lymphs Abs: 0.4 10*3/uL — ABNORMAL LOW (ref 0.7–4.0)
MCH: 33.4 pg (ref 26.0–34.0)
MCHC: 30.6 g/dL (ref 30.0–36.0)
MCV: 109.2 fL — ABNORMAL HIGH (ref 80.0–100.0)
Monocytes Absolute: 0.2 10*3/uL (ref 0.1–1.0)
Monocytes Relative: 10 %
Neutro Abs: 1 10*3/uL — ABNORMAL LOW (ref 1.7–7.7)
Neutrophils Relative %: 61 %
Platelets: 58 10*3/uL — ABNORMAL LOW (ref 150–400)
RBC: 2.93 MIL/uL — ABNORMAL LOW (ref 3.87–5.11)
RDW: 21.4 % — ABNORMAL HIGH (ref 11.5–15.5)
WBC: 1.7 10*3/uL — ABNORMAL LOW (ref 4.0–10.5)
nRBC: 34.3 % — ABNORMAL HIGH (ref 0.0–0.2)

## 2020-08-01 LAB — CBC
HCT: 25.9 % — ABNORMAL LOW (ref 36.0–46.0)
Hemoglobin: 7.5 g/dL — ABNORMAL LOW (ref 12.0–15.0)
MCH: 32.1 pg (ref 26.0–34.0)
MCHC: 29 g/dL — ABNORMAL LOW (ref 30.0–36.0)
MCV: 110.7 fL — ABNORMAL HIGH (ref 80.0–100.0)
Platelets: 20 10*3/uL — CL (ref 150–400)
RBC: 2.34 MIL/uL — ABNORMAL LOW (ref 3.87–5.11)
RDW: 22.8 % — ABNORMAL HIGH (ref 11.5–15.5)
WBC: 1.3 10*3/uL — CL (ref 4.0–10.5)
nRBC: 63.8 % — ABNORMAL HIGH (ref 0.0–0.2)

## 2020-08-01 LAB — HEPATIC FUNCTION PANEL
ALT: 60 U/L — ABNORMAL HIGH (ref 0–44)
AST: 468 U/L — ABNORMAL HIGH (ref 15–41)
Albumin: <1 g/dL — ABNORMAL LOW (ref 3.5–5.0)
Alkaline Phosphatase: 65 U/L (ref 38–126)
Bilirubin, Direct: 2.4 mg/dL — ABNORMAL HIGH (ref 0.0–0.2)
Indirect Bilirubin: 1.8 mg/dL — ABNORMAL HIGH (ref 0.3–0.9)
Total Bilirubin: 4.2 mg/dL — ABNORMAL HIGH (ref 0.3–1.2)
Total Protein: 3.4 g/dL — ABNORMAL LOW (ref 6.5–8.1)

## 2020-08-01 LAB — POCT I-STAT EG7
Acid-base deficit: 23 mmol/L — ABNORMAL HIGH (ref 0.0–2.0)
Bicarbonate: 9 mmol/L — ABNORMAL LOW (ref 20.0–28.0)
Calcium, Ion: 1.01 mmol/L — ABNORMAL LOW (ref 1.15–1.40)
HCT: 30 % — ABNORMAL LOW (ref 36.0–46.0)
Hemoglobin: 10.2 g/dL — ABNORMAL LOW (ref 12.0–15.0)
O2 Saturation: 55 %
Patient temperature: 36.4
Potassium: 3.8 mmol/L (ref 3.5–5.1)
Sodium: 130 mmol/L — ABNORMAL LOW (ref 135–145)
TCO2: 10 mmol/L — ABNORMAL LOW (ref 22–32)
pCO2, Ven: 44.1 mmHg (ref 44.0–60.0)
pH, Ven: 6.915 — CL (ref 7.250–7.430)
pO2, Ven: 46 mmHg — ABNORMAL HIGH (ref 32.0–45.0)

## 2020-08-01 LAB — COMPREHENSIVE METABOLIC PANEL
ALT: 35 U/L (ref 0–44)
AST: 272 U/L — ABNORMAL HIGH (ref 15–41)
Albumin: 1.3 g/dL — ABNORMAL LOW (ref 3.5–5.0)
Alkaline Phosphatase: 56 U/L (ref 38–126)
Anion gap: 19 — ABNORMAL HIGH (ref 5–15)
BUN: 21 mg/dL — ABNORMAL HIGH (ref 6–20)
CO2: 9 mmol/L — ABNORMAL LOW (ref 22–32)
Calcium: 6.9 mg/dL — ABNORMAL LOW (ref 8.9–10.3)
Chloride: 101 mmol/L (ref 98–111)
Creatinine, Ser: 2.3 mg/dL — ABNORMAL HIGH (ref 0.44–1.00)
GFR, Estimated: 24 mL/min — ABNORMAL LOW (ref >60)
Glucose, Bld: 82 mg/dL (ref 70–99)
Potassium: 3.7 mmol/L (ref 3.5–5.1)
Sodium: 129 mmol/L — ABNORMAL LOW (ref 135–145)
Total Bilirubin: 6.1 mg/dL — ABNORMAL HIGH (ref 0.3–1.2)
Total Protein: 5 g/dL — ABNORMAL LOW (ref 6.5–8.1)

## 2020-08-01 LAB — PATHOLOGIST SMEAR REVIEW

## 2020-08-01 LAB — HEMOGLOBIN A1C
Hgb A1c MFr Bld: 4 % — ABNORMAL LOW (ref 4.8–5.6)
Mean Plasma Glucose: 68.1 mg/dL

## 2020-08-01 LAB — BPAM RBC
Blood Product Expiration Date: 202202152359
Blood Product Expiration Date: 202203012359
ISSUE DATE / TIME: 202202032131
ISSUE DATE / TIME: 202202032346
Unit Type and Rh: 5100
Unit Type and Rh: 9500

## 2020-08-01 LAB — PHOSPHORUS: Phosphorus: 8.6 mg/dL — ABNORMAL HIGH (ref 2.5–4.6)

## 2020-08-01 LAB — GLUCOSE, CAPILLARY
Glucose-Capillary: 73 mg/dL (ref 70–99)
Glucose-Capillary: 209 mg/dL — ABNORMAL HIGH (ref 70–99)
Glucose-Capillary: 30 mg/dL — CL (ref 70–99)
Glucose-Capillary: 148 mg/dL — ABNORMAL HIGH (ref 70–99)
Glucose-Capillary: 171 mg/dL — ABNORMAL HIGH (ref 70–99)
Glucose-Capillary: 65 mg/dL — ABNORMAL LOW (ref 70–99)

## 2020-08-01 LAB — MAGNESIUM: Magnesium: 1.8 mg/dL (ref 1.7–2.4)

## 2020-08-01 LAB — FIBRINOGEN: Fibrinogen: 87 mg/dL — CL (ref 210–475)

## 2020-08-01 LAB — APTT: aPTT: 134 seconds — ABNORMAL HIGH (ref 24–36)

## 2020-08-01 LAB — BASIC METABOLIC PANEL
BUN: 20 mg/dL (ref 6–20)
CO2: <7 mmol/L — ABNORMAL LOW (ref 22–32)
Calcium: 6.6 mg/dL — ABNORMAL LOW (ref 8.9–10.3)
Chloride: 98 mmol/L (ref 98–111)
Creatinine, Ser: 2.63 mg/dL — ABNORMAL HIGH (ref 0.44–1.00)
GFR, Estimated: 20 mL/min — ABNORMAL LOW (ref >60)
Glucose, Bld: 75 mg/dL (ref 70–99)
Potassium: 5.5 mmol/L — ABNORMAL HIGH (ref 3.5–5.1)
Sodium: 136 mmol/L (ref 135–145)

## 2020-08-01 LAB — ECHOCARDIOGRAM LIMITED
Height: 67 in
Weight: 3569.69 oz

## 2020-08-01 LAB — PROTIME-INR
INR: 5.7 (ref 0.8–1.2)
Prothrombin Time: 49.5 seconds — ABNORMAL HIGH (ref 11.4–15.2)

## 2020-08-01 LAB — PROCALCITONIN: Procalcitonin: 23.41 ng/mL

## 2020-08-01 LAB — TROPONIN I (HIGH SENSITIVITY)
Troponin I (High Sensitivity): 10087 ng/L (ref <18)
Troponin I (High Sensitivity): 8067 ng/L (ref <18)

## 2020-08-01 LAB — LACTIC ACID, PLASMA
Lactic Acid, Venous: >11 mmol/L (ref 0.5–1.9)
Lactic Acid, Venous: >11 mmol/L (ref 0.5–1.9)
Lactic Acid, Venous: >11 mmol/L (ref 0.5–1.9)

## 2020-08-01 LAB — CBG MONITORING, ED: Glucose-Capillary: 118 mg/dL — ABNORMAL HIGH (ref 70–99)

## 2020-08-01 LAB — AMMONIA: Ammonia: 149 umol/L — ABNORMAL HIGH (ref 9–35)

## 2020-08-01 LAB — BETA-HYDROXYBUTYRIC ACID: Beta-Hydroxybutyric Acid: 0.11 mmol/L (ref 0.05–0.27)

## 2020-08-01 LAB — D-DIMER, QUANTITATIVE: D-Dimer, Quant: >20 ug/mL-FEU — ABNORMAL HIGH (ref 0.00–0.50)

## 2020-08-01 LAB — BRAIN NATRIURETIC PEPTIDE: B Natriuretic Peptide: 2096.9 pg/mL — ABNORMAL HIGH (ref 0.0–100.0)

## 2020-08-01 LAB — MRSA PCR SCREENING: MRSA by PCR: NEGATIVE

## 2020-08-01 MED ORDER — MAGNESIUM SULFATE 2 GM/50ML IV SOLN
2.0000 g | Freq: Once | INTRAVENOUS | Status: AC
  Administered 2020-08-01: 2 g via INTRAVENOUS

## 2020-08-01 MED ORDER — SODIUM BICARBONATE 8.4 % IV SOLN
INTRAVENOUS | Status: DC
  Filled 2020-08-01 (×2): qty 850

## 2020-08-01 MED ORDER — DEXTROSE 50 % IV SOLN
25.0000 g | INTRAVENOUS | Status: AC
  Administered 2020-08-01: 25 g via INTRAVENOUS
  Filled 2020-08-01: qty 50

## 2020-08-01 MED ORDER — NOREPINEPHRINE 16 MG/250ML-% IV SOLN
5.0000 ug/min | INTRAVENOUS | Status: DC
  Administered 2020-08-01: 50 ug/min via INTRAVENOUS
  Administered 2020-08-01: 60 ug/min via INTRAVENOUS
  Filled 2020-08-01 (×2): qty 250

## 2020-08-01 MED ORDER — VASOPRESSIN 20 UNITS/100 ML INFUSION FOR SHOCK
0.0000 [IU]/min | INTRAVENOUS | Status: DC
  Administered 2020-08-01 (×2): 0.04 [IU]/min via INTRAVENOUS
  Filled 2020-08-01: qty 100

## 2020-08-01 MED ORDER — PANTOPRAZOLE SODIUM 40 MG PO PACK: 40.0000 mg | PACK | Freq: Every day | ORAL | Status: DC

## 2020-08-01 MED ORDER — FENTANYL CITRATE (PF) 100 MCG/2ML IJ SOLN: 50.0000 ug | INTRAMUSCULAR | Status: DC | PRN

## 2020-08-01 MED ORDER — SODIUM CHLORIDE 0.9 % IV SOLN
250.0000 mL | INTRAVENOUS | Status: DC
  Administered 2020-08-01: 250 mL via INTRAVENOUS

## 2020-08-01 MED ORDER — MAGNESIUM SULFATE 50 % IJ SOLN: 1.0000 g | Freq: Once | INTRAMUSCULAR | Status: DC

## 2020-08-01 MED ORDER — DOCUSATE SODIUM 100 MG PO CAPS: 100.0000 mg | ORAL_CAPSULE | Freq: Two times a day (BID) | ORAL | Status: DC | PRN

## 2020-08-01 MED ORDER — DEXTROSE 50 % IV SOLN
INTRAVENOUS | Status: AC
  Administered 2020-08-01: 50 mL
  Filled 2020-08-01: qty 50

## 2020-08-01 MED ORDER — DOCUSATE SODIUM 50 MG/5ML PO LIQD: 100.0000 mg | Freq: Two times a day (BID) | ORAL | Status: DC

## 2020-08-01 MED ORDER — SODIUM BICARBONATE 8.4 % IV SOLN
100.0000 meq | Freq: Once | INTRAVENOUS | Status: AC
  Administered 2020-08-01: 100 meq via INTRAVENOUS

## 2020-08-01 MED ORDER — POLYETHYLENE GLYCOL 3350 17 G PO PACK: 17.0000 g | PACK | Freq: Every day | ORAL | Status: DC | PRN

## 2020-08-01 MED ORDER — EPINEPHRINE 1 MG/10ML IJ SOSY
PREFILLED_SYRINGE | INTRAMUSCULAR | Status: AC
  Filled 2020-08-01: qty 10

## 2020-08-01 MED ORDER — HYDROCORTISONE NA SUCCINATE PF 100 MG IJ SOLR
50.0000 mg | Freq: Four times a day (QID) | INTRAMUSCULAR | Status: DC
  Administered 2020-08-01 (×2): 50 mg via INTRAVENOUS
  Filled 2020-08-01 (×2): qty 2

## 2020-08-01 MED ORDER — MAGNESIUM SULFATE IN D5W 1-5 GM/100ML-% IV SOLN
1.0000 g | Freq: Once | INTRAVENOUS | Status: DC
  Filled 2020-08-01: qty 100

## 2020-08-01 MED ORDER — PANTOPRAZOLE SODIUM 40 MG IV SOLR
40.0000 mg | INTRAVENOUS | Status: DC
  Administered 2020-08-01: 40 mg via INTRAVENOUS
  Filled 2020-08-01: qty 40

## 2020-08-01 MED ORDER — SODIUM BICARBONATE 8.4 % IV SOLN
100.0000 meq | Freq: Once | INTRAVENOUS | Status: DC
  Filled 2020-08-01: qty 100

## 2020-08-01 MED ORDER — DEXTROSE 50 % IV SOLN
12.5000 g | INTRAVENOUS | Status: AC
  Administered 2020-08-01: 12.5 g via INTRAVENOUS

## 2020-08-01 MED ORDER — SODIUM CHLORIDE 0.9 % IV SOLN
2.0000 g | Freq: Two times a day (BID) | INTRAVENOUS | Status: DC
  Filled 2020-08-01: qty 2

## 2020-08-01 MED ORDER — IPRATROPIUM-ALBUTEROL 0.5-2.5 (3) MG/3ML IN SOLN
3.0000 mL | Freq: Three times a day (TID) | RESPIRATORY_TRACT | Status: DC
  Administered 2020-08-01 (×2): 3 mL via RESPIRATORY_TRACT
  Filled 2020-08-01 (×2): qty 3

## 2020-08-01 MED ORDER — IPRATROPIUM-ALBUTEROL 0.5-2.5 (3) MG/3ML IN SOLN: 3.0000 mL | Freq: Three times a day (TID) | RESPIRATORY_TRACT | Status: DC

## 2020-08-01 MED ORDER — INSULIN ASPART 100 UNIT/ML ~~LOC~~ SOLN: 0.0000 [IU] | SUBCUTANEOUS | Status: DC

## 2020-08-01 MED ORDER — SODIUM BICARBONATE 8.4 % IV SOLN
INTRAVENOUS | Status: AC
  Administered 2020-08-01: 50 meq
  Filled 2020-08-01: qty 150

## 2020-08-01 MED ORDER — POLYETHYLENE GLYCOL 3350 17 G PO PACK: 17.0000 g | PACK | Freq: Every day | ORAL | Status: DC

## 2020-08-01 MED ORDER — LACTATED RINGERS IV BOLUS: 1000.0000 mL | Freq: Once | INTRAVENOUS | Status: DC

## 2020-08-01 MED ORDER — SODIUM CHLORIDE 0.9 % IV SOLN: 2.0000 g | INTRAVENOUS | Status: DC

## 2020-08-01 MED ORDER — NOREPINEPHRINE 4 MG/250ML-% IV SOLN: 0.0000 ug/min | INTRAVENOUS | Status: DC

## 2020-08-01 MED ORDER — SODIUM BICARBONATE 8.4 % IV SOLN
INTRAVENOUS | Status: DC
  Filled 2020-08-01: qty 850

## 2020-08-01 MED ORDER — SODIUM CHLORIDE 0.9 % IV SOLN: INTRAVENOUS | Status: DC

## 2020-08-01 MED ORDER — HEPARIN SODIUM (PORCINE) 5000 UNIT/ML IJ SOLN: 5000.0000 [IU] | Freq: Three times a day (TID) | INTRAMUSCULAR | Status: DC

## 2020-08-01 MED ORDER — METHYLPREDNISOLONE SODIUM SUCC 125 MG IJ SOLR
125.0000 mg | Freq: Once | INTRAMUSCULAR | Status: AC
  Administered 2020-08-01: 125 mg via INTRAVENOUS
  Filled 2020-08-01: qty 2

## 2020-08-01 MED ORDER — SODIUM BICARBONATE 8.4 % IV SOLN
100.0000 meq | Freq: Once | INTRAVENOUS | Status: AC
  Administered 2020-08-01: 100 meq via INTRAVENOUS
  Filled 2020-08-01: qty 100

## 2020-08-01 MED ORDER — EPINEPHRINE HCL 5 MG/250ML IV SOLN IN NS
0.5000 ug/min | INTRAVENOUS | Status: DC
  Administered 2020-08-01 (×3): 25 ug/min via INTRAVENOUS
  Filled 2020-08-01 (×3): qty 250

## 2020-08-01 MED ORDER — CHLORHEXIDINE GLUCONATE CLOTH 2 % EX PADS: 6.0000 | MEDICATED_PAD | Freq: Every day | CUTANEOUS | Status: DC

## 2020-08-01 MED ORDER — NOREPINEPHRINE 4 MG/250ML-% IV SOLN
5.0000 ug/min | INTRAVENOUS | Status: DC
  Filled 2020-08-01: qty 250

## 2020-08-02 LAB — FACTOR 8 ASSAY: Coagulation Factor VIII: 94 % (ref 56–140)

## 2020-08-02 MED FILL — Medication: Qty: 1 | Status: AC

## 2020-08-03 LAB — CULTURE, BLOOD (ROUTINE X 2): Special Requests: ADEQUATE

## 2020-08-04 LAB — GLUCOSE, CAPILLARY
Glucose-Capillary: 11 mg/dL — CL (ref 70–99)
Glucose-Capillary: 23 mg/dL — CL (ref 70–99)

## 2020-08-04 MED FILL — Medication: Qty: 1 | Status: AC

## 2020-08-06 MED FILL — Fentanyl Citrate Preservative Free (PF) Inj 100 MCG/2ML: INTRAMUSCULAR | Qty: 4 | Status: AC

## 2020-08-26 NOTE — Progress Notes (Signed)
RT x 2 attempted to retrieve ABG but was unsuccessful. RN aware.

## 2020-08-26 NOTE — Progress Notes (Signed)
[facility] Progress Note Patient Name: Stacey Monroe DOB: Sep 19, 1960 MRN: [id number]   Date of Service  08/18/20  HPI/Events of Note  Patient with out of hospital cardiac arrest and coma transferred from [facility] ED to [facility] ICU for TTM, she is in profound metabolic acidosis with pH of 6.9. Sodium bicarbonate infusion is running at 75 ml / hour.  eICU Interventions  New Patient Evaluation completed. Sodium bicarbonate 2 amps iv push given and Bicarb infusion increased to 125 ml / hour. Beta hydroxybutyrate sent to screen patient for keto acidosis contributing to this profound metabolic acidosis. TTM in progress. Dr.  Jonnie Finner is at bedside.        [name] 08/18/20, 5:20 AM

## 2020-08-26 NOTE — Progress Notes (Signed)
ABG results noted and d/w elink Dr Lucile Shutters. HCO3 pushes have not been given yet. Give HCO3 and recheck ABG @ 0645

## 2020-08-26 NOTE — Progress Notes (Addendum)
Pharmacy Antibiotic Note  Stacey Monroe is a 60 y.o. female admitted on 08/04/2020 with cardiac arrest. Pharmacy has been consulted for Zosyn dosing for possible aspiration.  Plan: Zosyn 3.375g IV EI q8h Follow Cr closely  ADDENDUM: Hives to PCN in past, keep cefepime, adjust dose for renal failure.  Height: 5\' 7"  (170.2 cm) Weight: 101.2 kg (223 lb 1.7 oz) IBW/kg (Calculated) : 61.6  Temp (24hrs), Avg:97.4 F (36.3 C), Min:95 F (35 C), Max:98.1 F (36.7 C)  Recent Labs  Lab 08/04/2020 1837 08/17/2020 1940 08/25/2020 2020 August 22, 2020 0413 08-22-20 0833  WBC 5.7  --   --  1.7*  --   CREATININE 1.71*  --   --  2.30*  --   LATICACIDVEN  --  >11.0* >11.0* >11.0* >11.0*    Estimated Creatinine Clearance: 31.8 mL/min (A) (by C-G formula based on SCr of 2.3 mg/dL (H)).    Allergies  Allergen Reactions  . Penicillin G Hives    Did it involve swelling of the face/tongue/throat, SOB, or low BP?No Did it involve sudden or severe rash/hives, skin peeling, or any reaction on the inside of your mouth or nose? Yes Did you need to seek medical attention at a hospital or doctor's office? No When did it last happen?Child If all above answers are "NO", may proceed with cephalosporin use.    Arrie Senate, PharmD, BCPS, [facility] Clinical Pharmacist [phone] Please check AMION for all [place] numbers 2020/08/22

## 2020-08-26 NOTE — Plan of Care (Signed)
  Problem: Clinical Measurements: Goal: Ability to maintain clinical measurements within normal limits will improve Outcome: Not Progressing Goal: Will remain free from infection Outcome: Not Progressing Goal: Diagnostic test results will improve Outcome: Not Progressing Goal: Respiratory complications will improve Outcome: Not Progressing Goal: Cardiovascular complication will be avoided Outcome: Not Progressing   Decreasing BP, not responding to therapy

## 2020-08-26 NOTE — Progress Notes (Signed)
eLink Physician-Brief Progress Note Patient Name: Stacey Monroe DOB: 1960-07-23 MRN: [id number]   Date of Service  08-02-2020  HPI/Events of Note  Severe metabolic acidosis with pH 7.05.  eICU Interventions  2 amps of sodium bicarbonate ordered iv push.        Kerry Kass Filemon Breton Aug 02, 2020, 6:51 AM

## 2020-08-26 NOTE — Progress Notes (Signed)
NAME:  Stacey Monroe, MRN:  [id number], DOB:  January 26, 1961, LOS: 1 ADMISSION DATE:  08/12/2020, CONSULTATION DATE:  2020/08/07  REFERRING MD:  Gerlene Fee, MD, CHIEF COMPLAINT:  Cardiac arrest   Brief History:  Stacey Monroe is a 60 y.o. female with medical history detailed below admitted from [facility] after cardiac arrest with non-shockable rhythm thought to be the result of respiratory arrest.  History of Present Illness:  Stacey Monroe is a 60 y.o. female with medical history detailed below admitted from [facility] after cardiac arrest with non-shockable rhythm thought to be the result of respiratory arrest. EMS was initially called to her home after she sustained a fall. Arrest was witnessed by EMS at the scene as she was reportedly initially responsive upon their arrival. EMS reports 10 minutes of CPR with epi given x1 and D50 given for FSBS of 65. King airway placed in the field, subsequently intubated on arrival to [facility]. She was significantly hypotensive, unresponsive to volume resuscitation, so she was started on pressors and RIJ CVC was placed in the ED.  She arrived in the [facility] ED around 1800. She has remained afebrile and hypotensive. No cooling protocol initiated. Initial blood gas showed pH 6.96 with pCO2 of 45. She was transfused 2 units of PRBCs for Hgb 7.0. Other notable labs included glucose of 43, platelet count of 80, sodium of 128 (in the setting of known cirrhosis), potassium 3.4, Co2 13 (anion gap 17), lactic acid >11, BUN 21, creatinine 1.71, TBili 6.7, AST 56. Mildly elevated troponin at 84 after CPR. Covid negative.   CT head showed no acute abnormality. CTA chest showed no PE with bibasilar airspace opacities and GGO with small bilateral effusions and 5 cm mass in the right lower lobe. CT C-spine showed no acute fracture.  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  Increasing consolidation in the bases bilaterally right greater than left. Within the right basilar consolidation, there is a cavitary mass lesion which was seen on the prior CT examination again suspicious for malignancy. The degree of cavitation is less than that seen on the prior exam. The lesion has also increased in size now measuring 5 cm.  Changes of cirrhosis of the liver with ascites.  Aortic Atherosclerosis (ICD10-I70.0).  CT HEAD WITHOUT CONTRAST 08/05/2020 23:19 IMPRESSION: 1. No acute intracranial abnormality. No skull fracture. 2. Unchanged atrophy and chronic small vessel ischemia.   Micro Data:  Blood, urine, tracheal cultures pending  Antimicrobials:  Cefepime, vancomycin 2/4-present   Objective   Blood pressure (!) 81/31, pulse (!) 123, temperature (!) 97.34 F (36.3 C), resp. rate (!) 35, height 5\' 7"  (1.702 m), weight 101.2 kg, SpO2 (!) 85 %.    Vent Mode: PRVC FiO2 (%):  [100 %] 100 % Set Rate:  [20 bmp-35 bmp] 35 bmp Vt Set:  [500 mL] 500 mL PEEP:  [5 cmH20-8 cmH20] 8 cmH20 Plateau Pressure:  [18 cmH20-26 cmH20] 26 cmH20   Intake/Output Summary (Last 24 hours) at August 07, 2020 7673 Last data filed at 08/07/2020 0700 Gross per 24 hour  Intake 2386.05 ml  Output 45 ml  Net 2341.05 ml  Filed Weights   08/12/2020 1850  Weight: 101.2 kg    Examination: General: Jaundiced female with acute distress multiple pressors maximal ventilatory settings HEENT: MM pink/moist, jaundice Neuro: Not responsive CV: Sinus tach PULM: Coarse rhonchi left greater than right Vent pressure regulated volume control FIO2 100% with PO2 of 64 PEEP 8 RATE 35 with air trapping noted VT  500  GI: Soft no bowel sounds: Extremities: Cool decrease circulation in all extremities right radial A-line in place multiple areas of ecchymosis upper extremities left lower lobe IO site noted Skin: no rashes or lesions    Assessment & Plan:  1. Cardiac arrest, non-shockable rhythm Maximum interventions have been instituted And changed to a limited CODE BLUE no shock no CPR after discussion with her son Continue current therapy with consideration to comfort care in the near future.   2. Acute hypoxic respiratory failure requiring intubation Maximum ventilator settings have been obtained Serial chest x-rays Maintain respiratory rate of 35 to assist with metabolic acidosis  3. Septic shock Maximum vasopressors have been instituted Proven refractory to current intervention  4. Aspiration pneumonia Empirical antimicrobial therapy  5. Profound lactic acidosis Maximum interventions have been instituted  6. Hyperbilirubinemia in the setting of known cirrhosis Monitor LFTs if survives  7. Acute kidney injury Lab Results  Component Value Date   CREATININE 2.30 (H) 08-18-20   CREATININE 1.71 (H) 08/15/2020   CREATININE 0.94 04/05/2020  Related multiorgan dysfunction Severe sepsis 8. Hypoglycemia Check glucose via A-line or central line for accuracy 9. Electrolyte disturbances Replete and monitor as needed 10. Anemia, thrombocytopenia Status post transfusion Monitor 11. Active lung cancer not on chemotherapy Lung cancer was could not be treated with chemo due to her multiple comorbidities.  Prognosis extremely poor this is been discussed with the son who agrees with a limited CODE BLUE.   Best practice (evaluated daily)  Diet: NPO Pain/Anxiety/Delirium protocol (if indicated): Fentanyl IV pushes for RASS goal 0 VAP protocol (if indicated): Per protocol DVT prophylaxis: SCDs GI prophylaxis: PPI Glucose control: SSI Mobility: Bed rest Disposition: Remain in  ICU  Goals of Care:  Last date of multidisciplinary goals of care discussion:N/A Family and staff present: N/A Summary of discussion: N/A Follow up goals of care discussion due: N/A Code Status: LCB no shock/cpr per discussion 2020/08/18 0800 hrs. with son Aneshia Jacquet.  Labs   CBC: Recent Labs  Lab 08/03/2020 1837 08/18/20 0413 2020-08-18 0504 08/18/2020 0545 08-18-2020 0647 August 18, 2020 0807  WBC 5.7 1.7*  --   --   --   --   NEUTROABS 4.2 1.0*  --   --   --   --   HGB 7.0* 9.8* 10.2* 9.9* 9.5* 7.1*  HCT 24.5* 32.0* 30.0* 29.0* 28.0* 21.0*  MCV 121.3* 109.2*  --   --   --   --   PLT 80* 58*  --   --   --   --     Basic Metabolic Panel: Recent Labs  Lab 08/16/2020 1837 08-18-2020 0413 Aug 18, 2020 0504 18-Aug-2020 0545 08/18/20 0647 Aug 18, 2020 0807  NA 128* 129* 130* 129* 132* 136  K 3.4* 3.7 3.8 3.7 3.9 3.3*  CL 98 101  --   --   --   --   CO2 13* 9*  --   --   --   --   GLUCOSE 43* 82  --   --   --   --   BUN 21* 21*  --   --   --   --  CREATININE 1.71* 2.30*  --   --   --   --   CALCIUM 7.9* 6.9*  --   --   --   --   MG  --  1.8  --   --   --   --   PHOS  --  8.6*  --   --   --   --    GFR: Estimated Creatinine Clearance: 31.8 mL/min (A) (by C-G formula based on SCr of 2.3 mg/dL (H)). Recent Labs  Lab 08/22/2020 1837 08/03/2020 1940 08/11/2020 2020 08/27/20 0413  PROCALCITON  --   --   --  23.41  WBC 5.7  --   --  1.7*  LATICACIDVEN  --  >11.0* >11.0* >11.0*    Liver Function Tests: Recent Labs  Lab 08/25/2020 1837 08/27/20 0413  AST 56* 272*  ALT <5 35  ALKPHOS 63 56  BILITOT 6.7* 6.1*  PROT 5.2* 5.0*  ALBUMIN 1.4* 1.3*   No results for input(s): LIPASE, AMYLASE in the last 168 hours. Recent Labs  Lab 08-27-2020 0708  AMMONIA 149*    ABG    Component Value Date/Time   PHART 7.174 (LL) 08/27/2020 0807   PCO2ART 37.7 2020/08/27 0807   PO2ART 64 (L) 08-27-20 0807   HCO3 14.0 (L) Aug 27, 2020 0807   TCO2 15 (L) 2020/08/27 0807   ACIDBASEDEF 14.0 (H) 08/27/20  0807   O2SAT 87.0 2020/08/27 0807     Coagulation Profile: Recent Labs  Lab 08-27-2020 0413  INR 5.7*    Cardiac Enzymes: No results for input(s): CKTOTAL, CKMB, CKMBINDEX, TROPONINI in the last 168 hours.  HbA1C: No results found for: HGBA1C  CBG: Recent Labs  Lab 08/10/2020 2325 27-Aug-2020 0103 Aug 27, 2020 0351 08-27-2020 0756 08-27-20 0800  GLUCAP 94 118* 73 11* 209*     Critical care time: 60 minutes       Richardson Landry Xavious Sharrar ACNP Acute Care Nurse Practitioner [place] Please consult Amion 08/27/20, 8:22 AM

## 2020-08-26 NOTE — ED Provider Notes (Signed)
Provider Note MRN:  [id number]  Arrival date & time: Aug 30, 2020    ED Course and Medical Decision Making  Assumed care from Dr. Eulis Foster at shift change.  Postarrest, profound hypotension, already accepted to the ICU at [facility], awaiting transport.  Patient's pressor requirement is increasing and so decision was made to obtain central access.  Will also add on steroids.  Overall patient doing quite poorly, severe lactic acidosis due to poor perfusion.  Son is understanding of the critical nature of the patient.  .Critical Care Performed by: Maudie Flakes, MD Authorized by: Maudie Flakes, MD   Critical care provider statement:    Critical care time (minutes):  38   Critical care was necessary to treat or prevent imminent or life-threatening deterioration of the following conditions:  Shock   Critical care was time spent personally by me on the following activities:  Discussions with consultants, evaluation of patient's response to treatment, examination of patient, ordering and performing treatments and interventions, ordering and review of laboratory studies, ordering and review of radiographic studies, pulse oximetry, re-evaluation of patient's condition, obtaining history from patient or surrogate and review of old charts   I assumed direction of critical care for this patient from another provider in my specialty: yes   .Central Line  Date/Time: 08-30-20 1:09 AM Performed by: Maudie Flakes, MD Authorized by: Maudie Flakes, MD   Consent:    Consent obtained:  Verbal and written   Consent given by: Son.   Risks, benefits, and alternatives were discussed: yes     Risks discussed:  Arterial puncture, infection, bleeding, incorrect placement, nerve damage and pneumothorax Universal protocol:    Procedure explained and questions answered to patient or proxy's satisfaction: yes     Immediately prior to procedure, a time out was called: yes     Patient identity confirmed:   Anonymous protocol, patient vented/unresponsive Pre-procedure details:    Indication(s): central venous access     Hand hygiene: Hand hygiene performed prior to insertion     Sterile barrier technique: All elements of maximal sterile technique followed     Skin preparation:  Chlorhexidine   Skin preparation agent: Skin preparation agent completely dried prior to procedure   Sedation:    Sedation type:  Deep Anesthesia:    Anesthesia method:  None Procedure details:    Location:  R internal jugular   Procedural supplies:  Triple lumen   Catheter size:  7 Fr   Landmarks identified: yes     Ultrasound guidance: yes     Ultrasound guidance timing: real time     Sterile ultrasound techniques: Sterile gel and sterile probe covers were used     Number of attempts:  1   Successful placement: yes   Post-procedure details:    Post-procedure:  Dressing applied and line sutured   Assessment:  Blood return through all ports, free fluid flow, no pneumothorax on x-ray and placement verified by x-ray   Procedure completion:  Tolerated well, no immediate complications    Final Clinical Impressions(s) / ED Diagnoses     ICD-10-CM   1. Cardiopulmonary arrest ([place])  I46.9   2. Anemia, unspecified type  D64.9   3. Acute kidney injury ([place])  N17.9   4. Hypoglycemia  E16.2   5. Hypothermia, initial encounter  T68.XXXA   6. Shock ([place])  R57.9     ED Discharge Orders    None      Discharge Instructions   None  Barth Kirks. Sedonia Small, [place] [email or web address]    Maudie Flakes, MD 08/31/2020 [phone]

## 2020-08-26 NOTE — Care Plan (Signed)
Received pt at just before 0400, non responsive to pain, on high dose Levo and Epi. She is biting on ETT when off Propofol, but no other response to noxious stimuli. E-Link notified of decreasing blood pressure and abnormal labs. Sodium Bicarb pushes given per order resulting in brief increase in BP, Pt on highest doses of Epi and Levo, BP continuing to fall. CCM physician and NP for Day shift updated on pt's condition. Reported of to Day shift nurse.

## 2020-08-26 NOTE — Death Summary Note (Signed)
DEATH SUMMARY   Patient Details  Name: Stacey Monroe MRN: [id number] DOB: 23-Mar-1961  Admission/Discharge Information   Admit Date:  2020/08/29  Date of Death: Date of Death: 08-30-20  Time of Death: Time of Death: 1437-10-03  Length of Stay: 1  Referring Physician: Isaac Bliss, Rayford Halsted, MD   Reason(s) for Hospitalization  PEA arrest with resultant multiorgan failure Stage 2b lung cancer R lung Aspiration pneumonitis Acute renal failure EtOH cirrhosis Failure to thrive Septic and cardiogenic shock  Diagnoses  Preliminary cause of death:  Secondary Diagnoses (including complications and co-morbidities):  Active Problems:   Cardiopulmonary arrest [facility])   Brief Hospital Course (including significant findings, care, treatment, and services provided and events leading to death)  Stacey Monroe is a 60 y.o. female with medical history detailed below admitted from [facility] after cardiac arrest with non-shockable rhythm thought to be the result of respiratory arrest. EMS was initially called to her home after she sustained a fall. Arrest was witnessed by EMS at the scene as she was reportedly initially responsive upon their arrival. EMS reports 10 minutes of CPR with epi given x1 and D50 given for FSBS of 65. King airway placed in the field, subsequently intubated on arrival to [facility]. She was significantly hypotensive, unresponsive to volume resuscitation, so she was started on pressors and RIJ CVC was placed in the ED.  She arrived in the [facility] ED around 1800. She has remained afebrile and hypotensive. No cooling protocol initiated. Initial blood gas showed pH 6.96 with pCO2 of 45. She was transfused 2 units of PRBCs for Hgb 7.0. Other notable labs included glucose of 43, platelet count of 80, sodium of 128 (in the setting of known cirrhosis), potassium 3.4, Co2 13 (anion gap 17), lactic acid >11, BUN 21, creatinine 1.71, TBili 6.7, AST 56. Mildly elevated troponin at 84  after CPR. Covid negative.   CT head showed no acute abnormality. CTA chest showed no PE with bibasilar airspace opacities and GGO with small bilateral effusions and 5 cm mass in the right lower lobe. CT C-spine showed no acute fracture.  Continued to deteriorate in ICU despite maximal aggressive care. Discussed with her son and husband who realize she has been suffering at home with diagnosis of lung cancer, advanced EtOH cirrhosis, and failure to thrive.  They allowed her to pass in peace.  Erskine Emery MD PCCM   Pertinent Labs and Studies  Significant Diagnostic Studies CT Head Wo Contrast  Result Date: August 29, 2020 CLINICAL DATA:  Polytrauma, critical, head/C-spine injury suspected Post fall.  Subsequent respiratory and cardiac arrest. EXAM: CT HEAD WITHOUT CONTRAST TECHNIQUE: Contiguous axial images were obtained from the base of the skull through the vertex without intravenous contrast. COMPARISON:  Brain MRI 11/05/2019 FINDINGS: Brain: Unchanged atrophy and chronic small vessel ischemia. No intracranial hemorrhage, mass effect, or midline shift. Gray-white differentiation is preserved, no evidence of cerebral edema. No hydrocephalus. The basilar cisterns are patent. No evidence of territorial infarct or acute ischemia. No extra-axial or intracranial fluid collection. Vascular: Atherosclerosis of skullbase vasculature without hyperdense vessel or abnormal calcification. Skull: No fracture or focal lesion. Sinuses/Orbits: Chronic opacification of lower right greater than left mastoid air cells. Tiny mucous retention cyst in the left maxillary sinus and scattered mucosal thickening of ethmoid air cells. No sinus fluid levels. Orbits are unremarkable. Other: None. IMPRESSION: 1. No acute intracranial abnormality. No skull fracture. 2. Unchanged atrophy and chronic small vessel ischemia. Electronically Signed   By: Threasa Beards  Sanford M.D.   On: 07/29/2020 23:19   CT Angio Chest PE W/Cm &/Or Wo  Cm  Result Date: 08/19/2020 CLINICAL DATA:  Recent fall with cardiac arrest, initial encounter EXAM: CT ANGIOGRAPHY CHEST WITH CONTRAST TECHNIQUE: Multidetector CT imaging of the chest was performed using the standard protocol during bolus administration of intravenous contrast. Multiplanar CT image reconstructions and MIPs were obtained to evaluate the vascular anatomy. CONTRAST:  170mL OMNIPAQUE IOHEXOL 350 MG/ML SOLN COMPARISON:  Chest x-ray from earlier in the same day, CT from 04/02/2020. FINDINGS: Cardiovascular: Atherosclerotic calcifications of the thoracic aorta are noted. No aneurysmal dilatation or dissection is noted. Cardiac enlargement is seen. No pericardial effusion is noted. Coronary calcifications are noted. Pulmonary artery shows a normal branching pattern no filling defects are identified to suggest pulmonary embolism. Pacing device is noted. Mediastinum/Nodes: Thoracic inlet is within normal limits. No sizable hilar or mediastinal adenopathy is noted. The esophagus is within normal limits. Gastric catheter and endotracheal tube are noted in satisfactory position. Lungs/Pleura: Lungs are well aerated with the exception of lower lobe consolidation and small effusions right greater than left. The previously seen cavitary mass lesion in the medial aspect of the right lower lobe is again seen although the degree of cavitation has decreased somewhat. Mass lesion has also increased in size now measuring approximately 5 cm. Emphysematous changes are noted. No sizable parenchymal nodules are noted. Upper Abdomen: Cirrhotic changes of liver are noted. Ascites is seen new from the prior exam. Musculoskeletal: Bony structures are within normal limits. Review of the MIP images confirms the above findings. IMPRESSION: No evidence of pulmonary emboli. Increasing consolidation in the bases bilaterally right greater than left. Within the right basilar consolidation, there is a cavitary mass lesion which was  seen on the prior CT examination again suspicious for malignancy. The degree of cavitation is less than that seen on the prior exam. The lesion has also increased in size now measuring 5 cm. Changes of cirrhosis of the liver with ascites. Aortic Atherosclerosis (ICD10-I70.0). Electronically Signed   By: Inez Catalina M.D.   On: 08/09/2020 23:24   CT Cervical Spine Wo Contrast  Result Date: 08/06/2020 CLINICAL DATA:  Post fall. Subsequent respiratory and cardiac arrest. EXAM: CT CERVICAL SPINE WITHOUT CONTRAST TECHNIQUE: Multidetector CT imaging of the cervical spine was performed without intravenous contrast. Multiplanar CT image reconstructions were also generated. COMPARISON:  None. FINDINGS: Alignment: Reversal of normal cervical lordosis. No traumatic subluxation. Skull base and vertebrae: No acute fracture. Vertebral body heights are maintained. The dens and skull base are intact. Soft tissues and spinal canal: No prevertebral fluid or swelling. No visible canal hematoma. Disc levels: Disc space narrowing and endplate spurring from I1-W4 through C6-C7. Upper chest: Assessed on concurrent chest CT, reported separately. Other: Endotracheal and orogastric tubes in place. IMPRESSION: 1. Reversal of normal cervical lordosis may be due to positioning or muscle spasm. 2. No acute fracture. 3. Multilevel degenerative disc disease. Electronically Signed   By: Keith Rake M.D.   On: 08/20/2020 23:22   DG Chest Port 1 View  Result Date: 08/16/2020 CLINICAL DATA:  ETT and central line placement EXAM: PORTABLE CHEST 1 VIEW COMPARISON:  2020-08-16, CT 08/12/2020 FINDINGS: Endotracheal tube tip terminates 5.6 cm from the carina in the mid trachea. Transesophageal tube tip terminates below the margins of imaging with the side port distal to the GE junction. Right IJ approach central venous catheter tip terminates near the superior cavoatrial junction. Additional external support devices and telemetry leads  overlie  the chest. Diffuse heterogeneous opacities are present in both lungs which may reflect a combination of atelectasis, airspace disease and layering pleural effusions as seen on comparison CT. No visible pneumothorax. Overall degree of opacity is slightly increased from prior, particular in the medial right lung base and periphery of the left lower lobe. Cardiomegaly is similar to comparison. The aorta is calcified. The remaining cardiomediastinal contours are unremarkable. No acute osseous or soft tissue abnormality. Degenerative changes are present in the imaged spine and shoulders. IMPRESSION: 1. Lines and tubes in satisfactory position. 2. Diffuse heterogeneous opacities in both lungs which may reflect a combination of atelectasis, airspace disease and layering pleural effusions as seen on comparison CT. Overall increase in the degree of opacity in the lung bases. Electronically Signed   By: Lovena Le M.D.   On: 20-Aug-2020 04:21   DG Chest Portable 1 View  Result Date: 08/20/2020 CLINICAL DATA:  Central line placement EXAM: PORTABLE CHEST 1 VIEW COMPARISON:  08/12/2020 FINDINGS: Support Apparatus: --Endotracheal tube: Tip at the level of the clavicular heads. --Enteric tube:Tip and sideport are below the field of view. --Catheter(s):Right internal jugular vein approach central venous catheter tip is at the cavoatrial junction. --Other: None Small right pleural effusion, unchanged. No focal airspace consolidation. No pneumothorax. IMPRESSION: Right internal jugular vein approach central venous catheter tip at the cavoatrial junction. Unchanged small right pleural effusion. No pneumothorax. Electronically Signed   By: Ulyses Jarred M.D.   On: August 20, 2020 01:16   DG Chest Portable 1 View  Result Date: 08/24/2020 CLINICAL DATA:  Intubated post CPR EXAM: PORTABLE CHEST 1 VIEW COMPARISON:  04/02/2020 chest radiograph. FINDINGS: Endotracheal tube tip is 4.7 cm above the carina. Enteric tube enters stomach with  the tip not seen on this image. Pacer pads overlie the left chest. Stable cardiomediastinal silhouette with top-normal heart size. No pneumothorax. No significant pleural effusions. Mild curvilinear scarring versus atelectasis at the right lung base. No overt pulmonary edema. IMPRESSION: 1. Well-positioned endotracheal and enteric tubes. 2. Mild curvilinear scarring versus atelectasis at the right lung base. Electronically Signed   By: Ilona Sorrel M.D.   On: 08/10/2020 19:01   ECHOCARDIOGRAM LIMITED  Result Date: 08/20/20    ECHOCARDIOGRAM LIMITED REPORT   Patient Name:   CHIVONNE RASCON Date of Exam: 08/20/2020 Medical Rec #:  [id number]       Height:       67.0 in Accession #:    [id number]      Weight:       223.1 lb Date of Birth:  1961-02-14       BSA:          2.118 m Patient Age:    89 years        BP:           81/31 mmHg Patient Gender: F               HR:           124 bpm. Exam Location:  Inpatient Procedure: Limited Echo Indications:    Check for pericardial effusion  History:        Patient has prior history of Echocardiogram examinations, most                 recent 07/07/2019. Arrythmias:Cardiac Arrest; Risk                 Factors:Hypertension and Dyslipidemia.  Sonographer:    Mikki Santee RDCS (AE) Referring  Phys: [id number] ADAM ROSS SCHERTZ IMPRESSIONS  1. No pericardial effusion.  2. Left ventricular ejection fraction, by estimation, is 60 to 65%. The left ventricle has normal function. Left ventricular endocardial border not optimally defined to evaluate regional wall motion.  3. Right ventricular systolic function is normal.  4. The inferior vena cava is normal in size with greater than 50% respiratory variability, suggesting right atrial pressure of 3 mmHg. FINDINGS  Left Ventricle: Left ventricular ejection fraction, by estimation, is 60 to 65%. The left ventricle has normal function. Left ventricular endocardial border not optimally defined to evaluate regional wall motion. Right  Ventricle: Right ventricular systolic function is normal. Pericardium: There is no evidence of pericardial effusion. Venous: The inferior vena cava is normal in size with greater than 50% respiratory variability, suggesting right atrial pressure of 3 mmHg. Candee Furbish MD Electronically signed by Candee Furbish MD Signature Date/Time: 2020-08-19/11:27:03 AM    Final     Microbiology No results found for this or any previous visit (from the past 240 hour(s)).  Lab Basic Metabolic Panel: No results for input(s): NA, K, CL, CO2, GLUCOSE, BUN, CREATININE, CALCIUM, MG, PHOS in the last 168 hours. Liver Function Tests: No results for input(s): AST, ALT, ALKPHOS, BILITOT, PROT, ALBUMIN in the last 168 hours. No results for input(s): LIPASE, AMYLASE in the last 168 hours. No results for input(s): AMMONIA in the last 168 hours. CBC: No results for input(s): WBC, NEUTROABS, HGB, HCT, MCV, PLT in the last 168 hours. Cardiac Enzymes: No results for input(s): CKTOTAL, CKMB, CKMBINDEX, TROPONINI in the last 168 hours. Sepsis Labs: No results for input(s): PROCALCITON, WBC, LATICACIDVEN in the last 168 hours.Candee Furbish 08/11/2020, 5:54 PM

## 2020-08-26 NOTE — Procedures (Signed)
Arterial Catheter Insertion Procedure Note  Stacey Monroe  [id number]  May 22, 1961  Date:August 11, 2020  Time:5:46 AM    Provider Performing: Michele Mcalpine Coolidge Gossard    Procedure: Insertion of Arterial Line [phone]) with US guidance ([id number])   Indication(s) Blood pressure monitoring and/or need for frequent ABGs  Consent Risks of the procedure as well as the alternatives and risks of each were explained to the patient and/or caregiver.  Consent for the procedure was obtained and is signed in the bedside chart  Anesthesia None   Time Out Verified patient identification, verified procedure, site/side was marked, verified correct patient position, special equipment/implants available, medications/allergies/relevant history reviewed, required imaging and test results available.   Sterile Technique Maximal sterile technique including full sterile barrier drape, hand hygiene, sterile gown, sterile gloves, mask, hair covering, sterile ultrasound probe cover (if used).   Procedure Description Area of catheter insertion was cleaned with chlorhexidine and draped in sterile fashion. With real-time ultrasound guidance an arterial catheter was placed into the right radial artery.  Appropriate arterial tracings confirmed on monitor.     Complications/Tolerance None; patient tolerated the procedure well.   EBL Minimal   Specimen(s) None  Bennie Pierini, MD 2020-08-11 5:47 AM

## 2020-08-26 NOTE — Progress Notes (Signed)
Initial Nutrition Assessment  DOCUMENTATION CODES:   Obesity unspecified  INTERVENTION:   -If plan to start TF due to pt improvement, recommend:  Initiate Vital AF 1.2 @ 35 ml/hr via OGT (840 ml/ day)  90 ml Prosource TF TID.    Tube feeding regimen provides 1248 kcal (100% of needs), 129 grams of protein, and 681 ml of H2O.   NUTRITION DIAGNOSIS:   Inadequate oral intake related to inability to eat as evidenced by NPO status.  GOAL:   Patient will meet greater than or equal to 90% of their needs  MONITOR:   Diet advancement,Vent status,Labs,Weight trends,Skin,I & O's  REASON FOR ASSESSMENT:   Consult,Ventilator Enteral/tube feeding initiation and management  ASSESSMENT:   Stacey Monroe is a 60 y.o. female with medical history detailed below admitted from [facility] after cardiac arrest with non-shockable rhythm thought to be the result of respiratory arrest.  Pt admitted with cardiac arrest.   Patient is currently intubated on ventilator support. OGT placement confirmed; currently connected to low, intermittent suction. MV: 17.7 L/min Temp (24hrs), Avg:97.4 F (36.3 C), Min:95 F (35 C), Max:98.1 F (36.7 C)  Reviewed I/O's: +2.3 L x 24 hours  UOP: 45 ml x 24 hours  MAP: 39  Per chart review, pt with very poor prognosis. Pt is on max pressors and on max ventilator settings. Pt son is agreeable to limited code blue. Initiating TF not appropriate at this time secondary to medical instability.   Reviewed wt hx; pt has experienced a 11.5% wt loss over the past year, which is not significant for time frame. Edema may also be masking further weight loss as well as fat and muscle depletions.  Medications reviewed and include IV solu-medrol, adrenalin, levophed, and pitressin.   Labs reviewed: CBGS: 209 (inpatient orders for glycemic control are 0-9 units insulin aspart every 4 hours).   Diet Order:   Diet Order            Diet NPO time specified  Diet  effective now                 EDUCATION NEEDS:   Not appropriate for education at this time  Skin:  Skin Assessment: Reviewed RN Assessment  Last BM:  Unknown  Height:   Ht Readings from Last 1 Encounters:  08/07/2020 5\' 7"  (1.702 m)    Weight:   Wt Readings from Last 1 Encounters:  08/16/2020 101.2 kg    Ideal Body Weight:  61.4 kg  BMI:  Body mass index is 34.94 kg/m.  Estimated Nutritional Needs:   Kcal:  [phone]  Protein:  120-150 grams  Fluid:  > 1.2 L    Loistine Chance, RD, LDN, [place] Registered Dietitian II Certified Diabetes Care and Education Specialist Please refer to [facility] for RD and/or RD on-call/weekend/after hours pager

## 2020-08-26 NOTE — Progress Notes (Signed)
Labs reviewed.  Discussed case with son and husband.  She has been suffering for a number of months since her diagnosis of cirrhosis and more recently lung cancer.  We agreed that we should continue current level of care but no escalation and allow Dianelly to pass in peace if she deteriorates with current level of care.  Would not draw further ABGs until AM.  Erskine Emery MD PCCM

## 2020-08-26 NOTE — H&P (Signed)
NAME:  Stacey Monroe, MRN:  [id number], DOB:  04/29/1961, LOS: 1 ADMISSION DATE:  08/20/2020, CONSULTATION DATE:  2020/08/27  REFERRING MD:  Gerlene Fee, MD, CHIEF COMPLAINT:  Cardiac arrest   Brief History:  Stacey Monroe is a 60 y.o. female with medical history detailed below admitted from [facility] after cardiac arrest with non-shockable rhythm thought to be the result of respiratory arrest.  History of Present Illness:  Stacey Monroe is a 60 y.o. female with medical history detailed below admitted from [facility] after cardiac arrest with non-shockable rhythm thought to be the result of respiratory arrest. EMS was initially called to her home after she sustained a fall. Arrest was witnessed by EMS at the scene as she was reportedly initially responsive upon their arrival. EMS reports 10 minutes of CPR with epi given x1 and D50 given for FSBS of 65. King airway placed in the field, subsequently intubated on arrival to [facility]. She was significantly hypotensive, unresponsive to volume resuscitation, so she was started on pressors and RIJ CVC was placed in the ED.  She arrived in the [facility] ED around 1800. She has remained afebrile and hypotensive. No cooling protocol initiated. Initial blood gas showed pH 6.96 with pCO2 of 45. She was transfused 2 units of PRBCs for Hgb 7.0. Other notable labs included glucose of 43, platelet count of 80, sodium of 128 (in the setting of known cirrhosis), potassium 3.4, Co2 13 (anion gap 17), lactic acid >11, BUN 21, creatinine 1.71, TBili 6.7, AST 56. Mildly elevated troponin at 84 after CPR. Covid negative.   CT head showed no acute abnormality. CTA chest showed no PE with bibasilar airspace opacities and GGO with small bilateral effusions and 5 cm mass in the right lower lobe. CT C-spine showed no acute fracture.  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  Increasing consolidation in the bases bilaterally right greater than left. Within the right basilar consolidation, there is a cavitary mass lesion which was seen on the prior CT examination again suspicious for malignancy. The degree of cavitation is less than that seen on the prior exam. The lesion has also increased in size now measuring 5 cm.  Changes of cirrhosis of the liver with ascites.  Aortic Atherosclerosis (ICD10-I70.0).  CT HEAD WITHOUT CONTRAST 08/10/2020 23:19 IMPRESSION: 1. No acute intracranial abnormality. No skull fracture. 2. Unchanged atrophy and chronic small vessel ischemia.   Micro Data:  Blood, urine, tracheal cultures pending  Antimicrobials:  Cefepime, vancomycin 2/4-present   Objective   Blood pressure 105/65, pulse (!) 126, temperature (!) 97.52 F (36.4 C), resp. rate (!) 26, height 5\' 7"  (1.702 m), weight 101.2 kg, SpO2 92 %.    Vent Mode: PRVC FiO2 (%):  [100 %] 100 % Set Rate:  [20 bmp] 20 bmp Vt Set:  [500 mL] 500 mL PEEP:  [5 cmH20] 5 cmH20 Plateau Pressure:  [18 cmH20-25 cmH20] 19 cmH20   Intake/Output Summary (Last 24 hours) at 08-27-2020 0407 Last data filed at 27-Aug-2020 0351 Gross per 24 hour  Intake 1311.13 ml  Output 35 ml  Net 1276.13 ml   [employer]  08/25/2020 1850  Weight: 101.2 kg    Examination: General: acutely ill appearing Caucasian female HENT: (+) scleral icterus, pupils 15mm and sluggishly reactive Lungs: decreased at the bases, no W/C/R Cardiovascular: tachycardic, no MRG Abdomen: mildly distended, soft, no fluid wave, decreased bowel sounds Extremities: trace edema, full body jaundice present Neuro: GCS 3, breathing over the  ventilator GU: Foley in place   Assessment & Plan:  1. Cardiac arrest, non-shockable rhythm 2. Acute hypoxic respiratory failure requiring intubation 3. Septic shock 4. Aspiration pneumonia 5. Profound lactic acidosis 6. Hyperbilirubinemia in the setting of known cirrhosis 7. Acute kidney injury 8. Hypoglycemia 9. Electrolyte disturbances 10. Anemia, thrombocytopenia 11. Active lung cancer not on chemotherapy  Stacey Monroe is an unfortunate woman with cardiac arrest with non-shockable rhythm thought to be secondary to hypoxic respiratory failure. Total time of CPR 10 minutes; no downtime preceding the onset of CPR as EMS was present when she lost pulses. CT chest shows bilateral airspace opacities consistent with large volume aspiration. Covid is negative. Shock with profound acidosis in the setting of known end-stage cirrhosis. She will have a very tough time clearing her lactate, particularly as I expect shock liver to develop given the severity of her shock. She has responded somewhat to bicarbonate infusion and ventilator adjustments. There is no emergent indication for initiation of CRRT specifically for acidosis at this point given her improving hemodynamics, although additional labs will need to follow and I would not be surprised if she develops oliguric AKI. Neurology will need to be consulted for EEG monitoring and prognostication. No role for targeted temperature management as she is out of the window for cooling at this point; fever avoidance should be sufficient.  - Ventilator settings: Vt 8 cc/kg/IBW, RR 28, adjust PEEP/FiO2 for Spo2 goal >=88% - VAP bundle, GI prophylaxis ordered - RASS goal 0; intermittent fentanyl pushes preferred for sedation - Goal temp is normothermia; will set TTM protocol for 36 C to ensure fever avoidance - EEG monitoring ordered; will need non-urgent formal Neurology consult in AM - Hold on Cardiology consult pending TTE to assess for cardiac  dysfunction - Wean pressors as able; start to come down on epinephrine first - Start stress dose steroids - RT to place A-line; serial ABG to follow pH - MAP goal >65 - Obtain cultures including blood, urine and tracheal aspirate - Start vancomycin and cefepime given cirrhosis and underlying malignancy - D7-AJOINO gtt for metabolic acidosis - Monitor renal function, electrolytes, UOP - Monitor liver function, INR; send factor VIII assay - Trend lactic - Repeat H&H to assess Hgb post-transfusion of 2 units PRBC; monitor for signs of active bleeding - Hold off on chemical VTE prophylaxis until further clarity on whether there is evidence of bleeding - I have discussed her critical illness with her son, Stacey Monroe and he voiced understanding of her condition   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)  Diet: NPO Pain/Anxiety/Delirium protocol (if indicated): Fentanyl IV pushes for RASS goal 0 VAP protocol (if indicated): Per protocol DVT prophylaxis: SCDs GI prophylaxis: PPI Glucose control: SSI Mobility: Bed rest Disposition: Remain in ICU  Goals of Care:  Last date of multidisciplinary goals of care discussion:N/A Family and staff present: N/A Summary of discussion: N/A Follow up goals of care discussion due: N/A Code Status: Full Code  Labs   CBC: Recent Labs  Lab 08/06/2020 1837  WBC 5.7  NEUTROABS 4.2  HGB 7.0*  HCT 24.5*  MCV 121.3*  PLT 80*    Basic Metabolic  Panel: Recent Labs  Lab 08/17/2020 1837  NA 128*  K 3.4*  CL 98  CO2 13*  GLUCOSE 43*  BUN 21*  CREATININE 1.71*  CALCIUM 7.9*   GFR: Estimated Creatinine Clearance: 42.7 mL/min (A) (by C-G formula based on SCr of 1.71 mg/dL (H)). Recent Labs  Lab 08/16/2020 1837 08/17/2020 1940 07/29/2020 2020  WBC 5.7  --   --   LATICACIDVEN  --  >11.0* >11.0*    Liver Function Tests: Recent Labs  Lab 08/22/2020 1837  AST 56*  ALT <5  ALKPHOS 63  BILITOT 6.7*  PROT 5.2*  ALBUMIN 1.4*   No results for input(s):  LIPASE, AMYLASE in the last 168 hours. No results for input(s): AMMONIA in the last 168 hours.  ABG    Component Value Date/Time   PHART 6.959 (LL) 08/19/2020 1957   PCO2ART 44.5 08/09/2020 1957   PO2ART 97.2 08/25/2020 1957   HCO3 8.9 (L) 08/14/2020 1957   ACIDBASEDEF 20.0 (H) 08/09/2020 1957   O2SAT 89.9 08/08/2020 1957     Coagulation Profile: No results for input(s): INR, PROTIME in the last 168 hours.  Cardiac Enzymes: No results for input(s): CKTOTAL, CKMB, CKMBINDEX, TROPONINI in the last 168 hours.  HbA1C: No results found for: HGBA1C  CBG: Recent Labs  Lab 07/30/2020 2209 08/13/2020 2239 08/23/2020 2325 24-Aug-2020 0103 2020-08-24 0351  GLUCAP 99 106* 94 118* 73    Review of Systems:   Unable to obtain due to patient status.  Past Medical History:  She,  has a past medical history of Chronic diastolic heart failure ([place]), Chronic musculoskeletal pain, Hyperlipidemia, Hypertension, Low back pain, OSA on CPAP, Osteoarthritis, Pre-eclampsia, Sleep apnea, obstructive, Slipped cervical disc, and SOB (shortness of breath).   Surgical History:   Past Surgical History:  Procedure Laterality Date  . CESAREAN SECTION    . COLONOSCOPY WITH PROPOFOL N/A 07/06/2019   Procedure: COLONOSCOPY WITH PROPOFOL;  Surgeon: Carol Ada, MD;  Location: [place];  Service: Endoscopy;  Laterality: N/A;  . ESOPHAGOGASTRODUODENOSCOPY (EGD) WITH PROPOFOL N/A 07/06/2019   Procedure: ESOPHAGOGASTRODUODENOSCOPY (EGD) WITH PROPOFOL;  Surgeon: Carol Ada, MD;  Location: [place];  Service: Endoscopy;  Laterality: N/A;  . HOT HEMOSTASIS N/A 07/06/2019   Procedure: HOT HEMOSTASIS (ARGON PLASMA COAGULATION/BICAP);  Surgeon: Carol Ada, MD;  Location: [place];  Service: Endoscopy;  Laterality: N/A;  . POLYPECTOMY  07/06/2019   Procedure: POLYPECTOMY;  Surgeon: Carol Ada, MD;  Location: [facility] ENDOSCOPY;  Service: Endoscopy;;  . TUBAL LIGATION       Social History:   reports that she has  been smoking cigarettes. She has a 30.00 pack-year smoking history. She has never used smokeless tobacco. She reports that she does not drink alcohol and does not use drugs.   Family History:  Her family history is not on file.   Allergies Allergies  Allergen Reactions  . Penicillin G Hives    Did it involve swelling of the face/tongue/throat, SOB, or low BP?No Did it involve sudden or severe rash/hives, skin peeling, or any reaction on the inside of your mouth or nose? Yes Did you need to seek medical attention at a hospital or doctor's office? No When did it last happen?Child If all above answers are "NO", may proceed with cephalosporin use.     Home Medications  Prior to Admission medications   Medication Sig Start Date End Date Taking? Authorizing Provider  acetaminophen (TYLENOL) 500 MG tablet Take 500 mg by mouth every 6 (six) hours as needed  for mild pain, moderate pain or headache.   Yes [provider]  furosemide (LASIX) 20 MG tablet Take 3 tablets (60 mg total) by mouth daily. Call Carol Ada, MD for refills 11/22/19 04/02/20  Isaac Bliss, Rayford Halsted, MD  omeprazole (PRILOSEC) 10 MG capsule Take 10 mg by mouth daily.    [provider]  spironolactone (ALDACTONE) 100 MG tablet Take 2 tablets (200 mg total) by mouth daily. Patient taking differently: Take 100 mg by mouth daily.  08/08/19   Patrecia Pour, MD  Vitamin D, Ergocalciferol, (DRISDOL) 1.25 MG (50000 UNIT) CAPS capsule Take 50,000 Units by mouth every 7 (seven) days.    [provider]  zolpidem (AMBIEN) 10 MG tablet Take 1 tablet (10 mg total) by mouth at bedtime. 02/29/20   Erline Hau, MD     Critical care time: 60 minutes

## 2020-08-26 NOTE — Progress Notes (Signed)
Called son, spencer with update and concern over grave condition. Frederico Hamman will come to bedside and notify significant other, Fara Olden as well.

## 2020-08-26 NOTE — ED Notes (Addendum)
Report given to [facility] by Baldo Ash, RN

## 2020-08-26 DEATH — deceased

## 2022-07-20 NOTE — Progress Notes (Signed)
This encounter was created in error - please disregard.

## 2023-01-21 IMAGING — CT CT CERVICAL SPINE W/O CM
3 of 4 series · 13 of 33 positions shown, 16 images · non-contrast
Comparison: None.

CLINICAL DATA: Post fall. Subsequent respiratory and cardiac
arrest.

EXAM:
CT CERVICAL SPINE WITHOUT CONTRAST
TECHNIQUE: Multidetector CT imaging of the cervical spine was performed without
intravenous contrast. Multiplanar CT image reconstructions were also
generated.

[Series 5: sagittal bone · sagittal · 0.25mm/px · 5 of 61 slices shown, 6 images]
[im 21/61  bone]
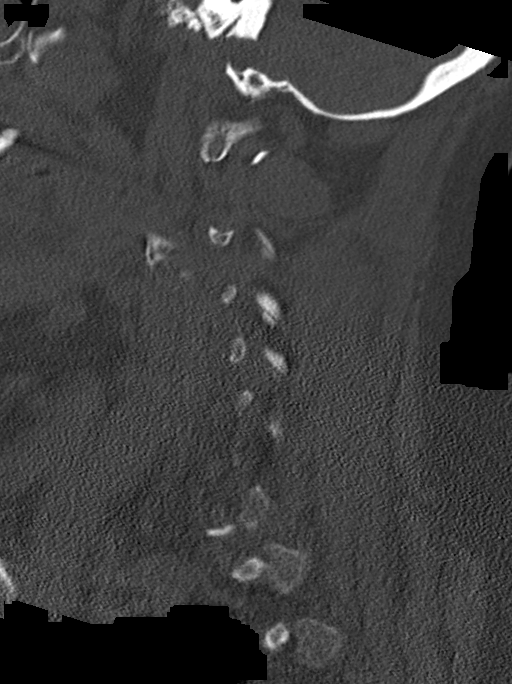
[im 26/61  bone]
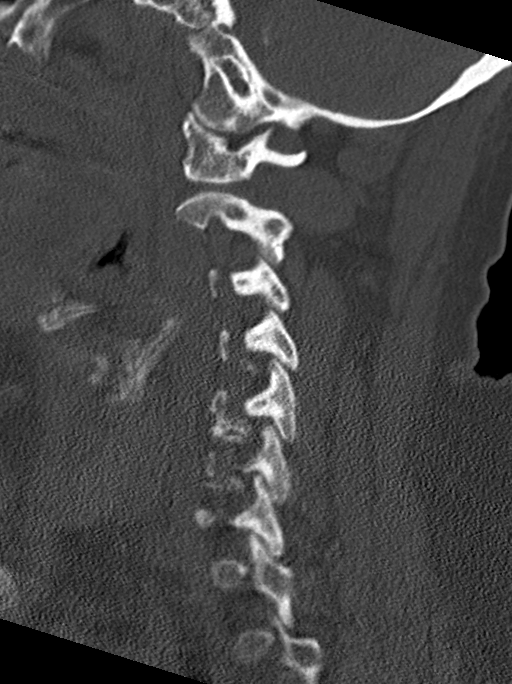
[im 31/61  soft-tissue]
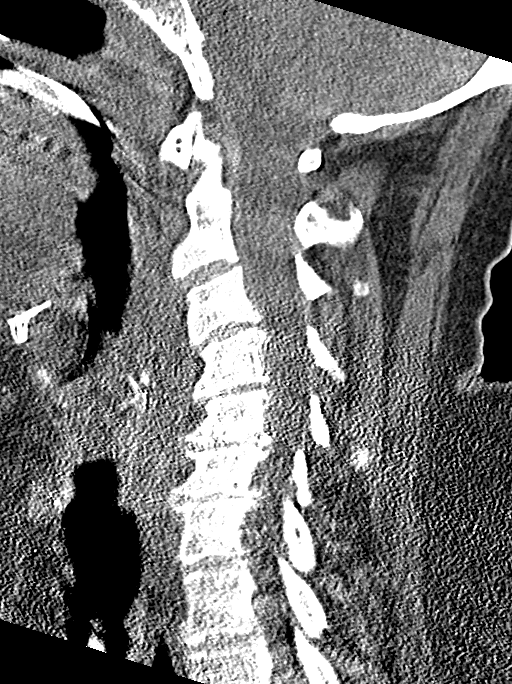
[im 31/61  bone]
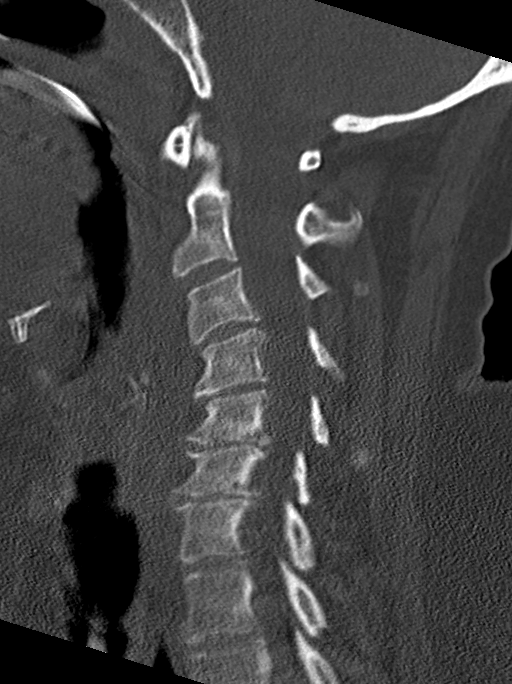
[im 36/61  bone]
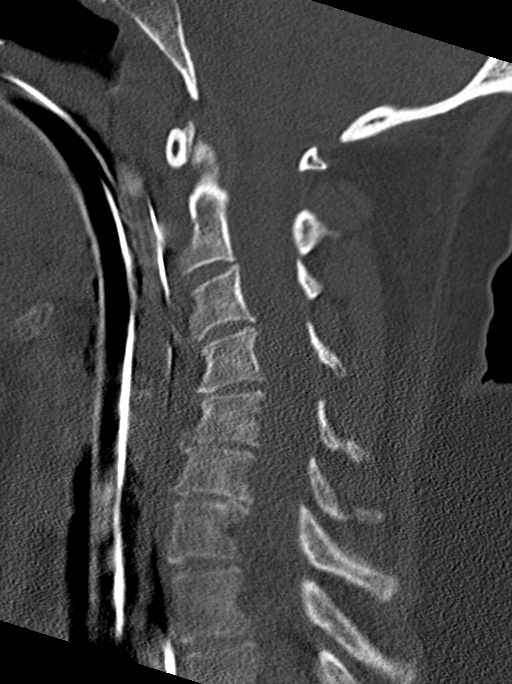
[im 41/61  bone]
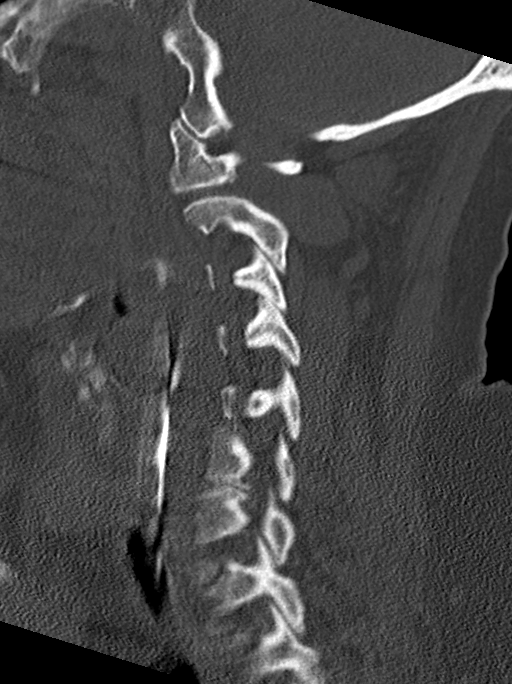

[Series 6: coronal bone · coronal · 0.25mm/px · 3 of 61 slices shown]
[im 13/61  bone]
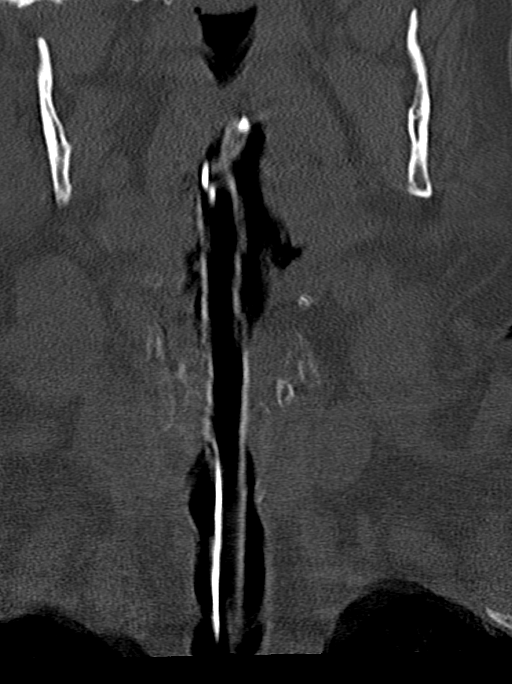
[im 25/61  bone]
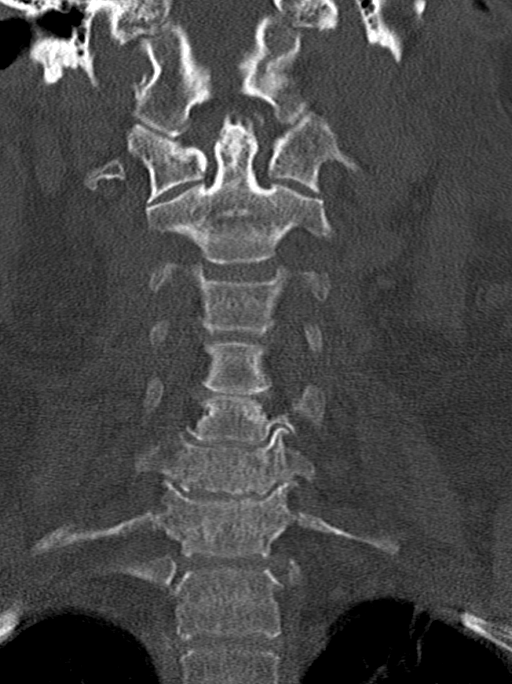
[im 37/61  bone]
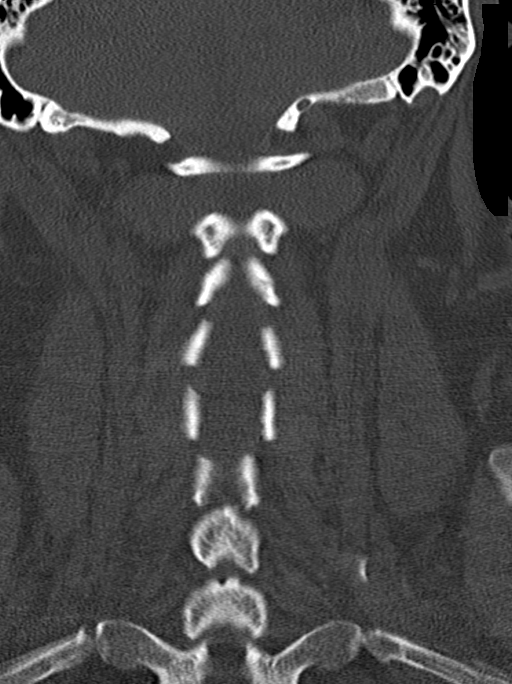

[Series 7: orthogonal axials · axial · 0.21mm/px · z∈[-144,-47]mm · 5 of 83 slices shown, 7 images]
[im 14/83  soft-tissue]
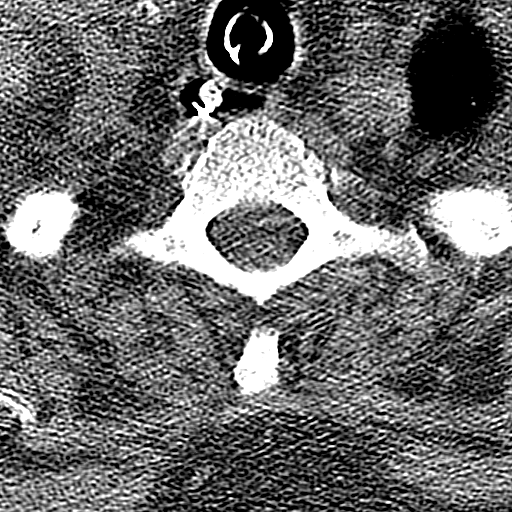
[im 14/83  bone]
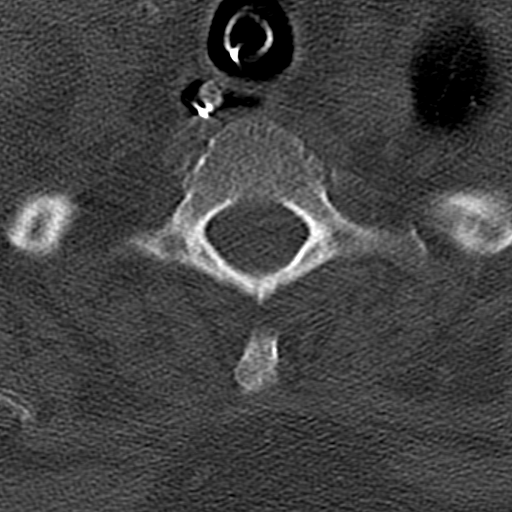
[im 28/83  bone]
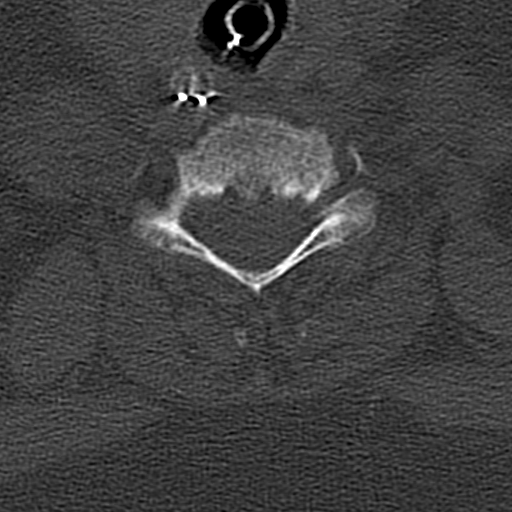
[im 42/83  bone]
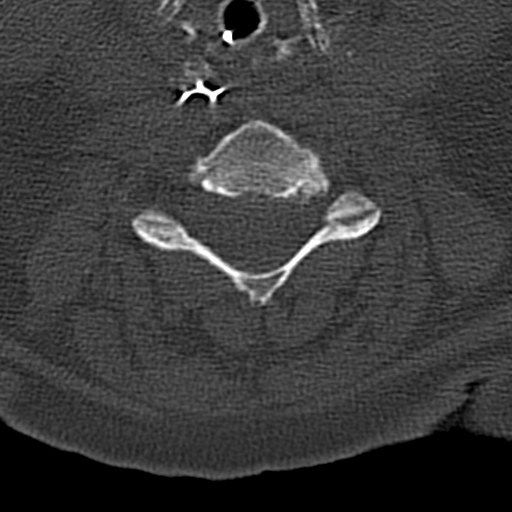
[im 55/83  bone]
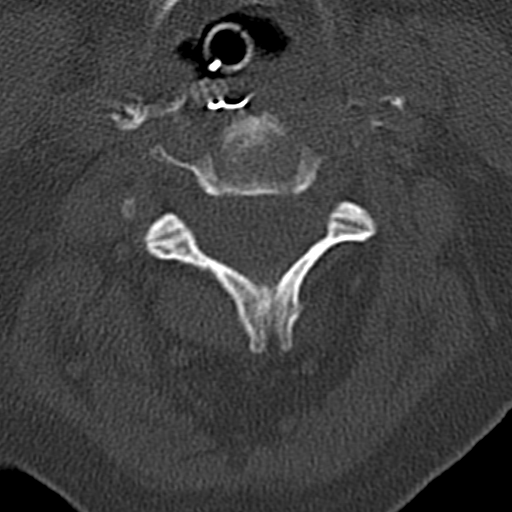
[im 69/83  soft-tissue]
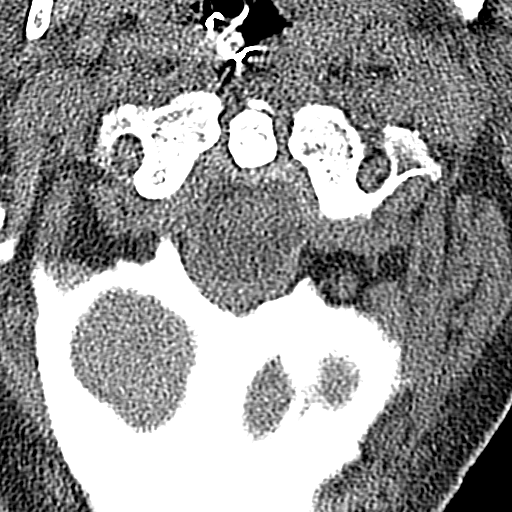
[im 69/83  bone]
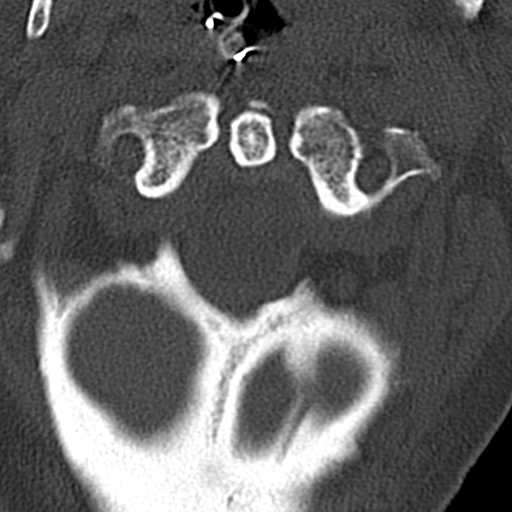

[13 of 33 positions shown; findings below may reference images not displayed]

FINDINGS: Alignment: Reversal of normal cervical lordosis. No traumatic
subluxation.

Skull base and vertebrae: No acute fracture. Vertebral body heights
are maintained. The dens and skull base are intact.

Soft tissues and spinal canal: No prevertebral fluid or swelling. No
visible canal hematoma.

Disc levels: Disc space narrowing and endplate spurring from C4-C5
through C6-C7.

Upper chest: Assessed on concurrent chest CT, reported separately.

Other: Endotracheal and orogastric tubes in place.
IMPRESSION: 1. Reversal of normal cervical lordosis may be due to positioning or
muscle spasm.
2. No acute fracture.
3. Multilevel degenerative disc disease.

## 2023-01-22 IMAGING — DX DG CHEST 1V PORT
1 series · 1 of 1 positions shown · non-contrast
Comparison: 07/31/2020

CLINICAL DATA: Central line placement

EXAM:
PORTABLE CHEST 1 VIEW

[chest ap]
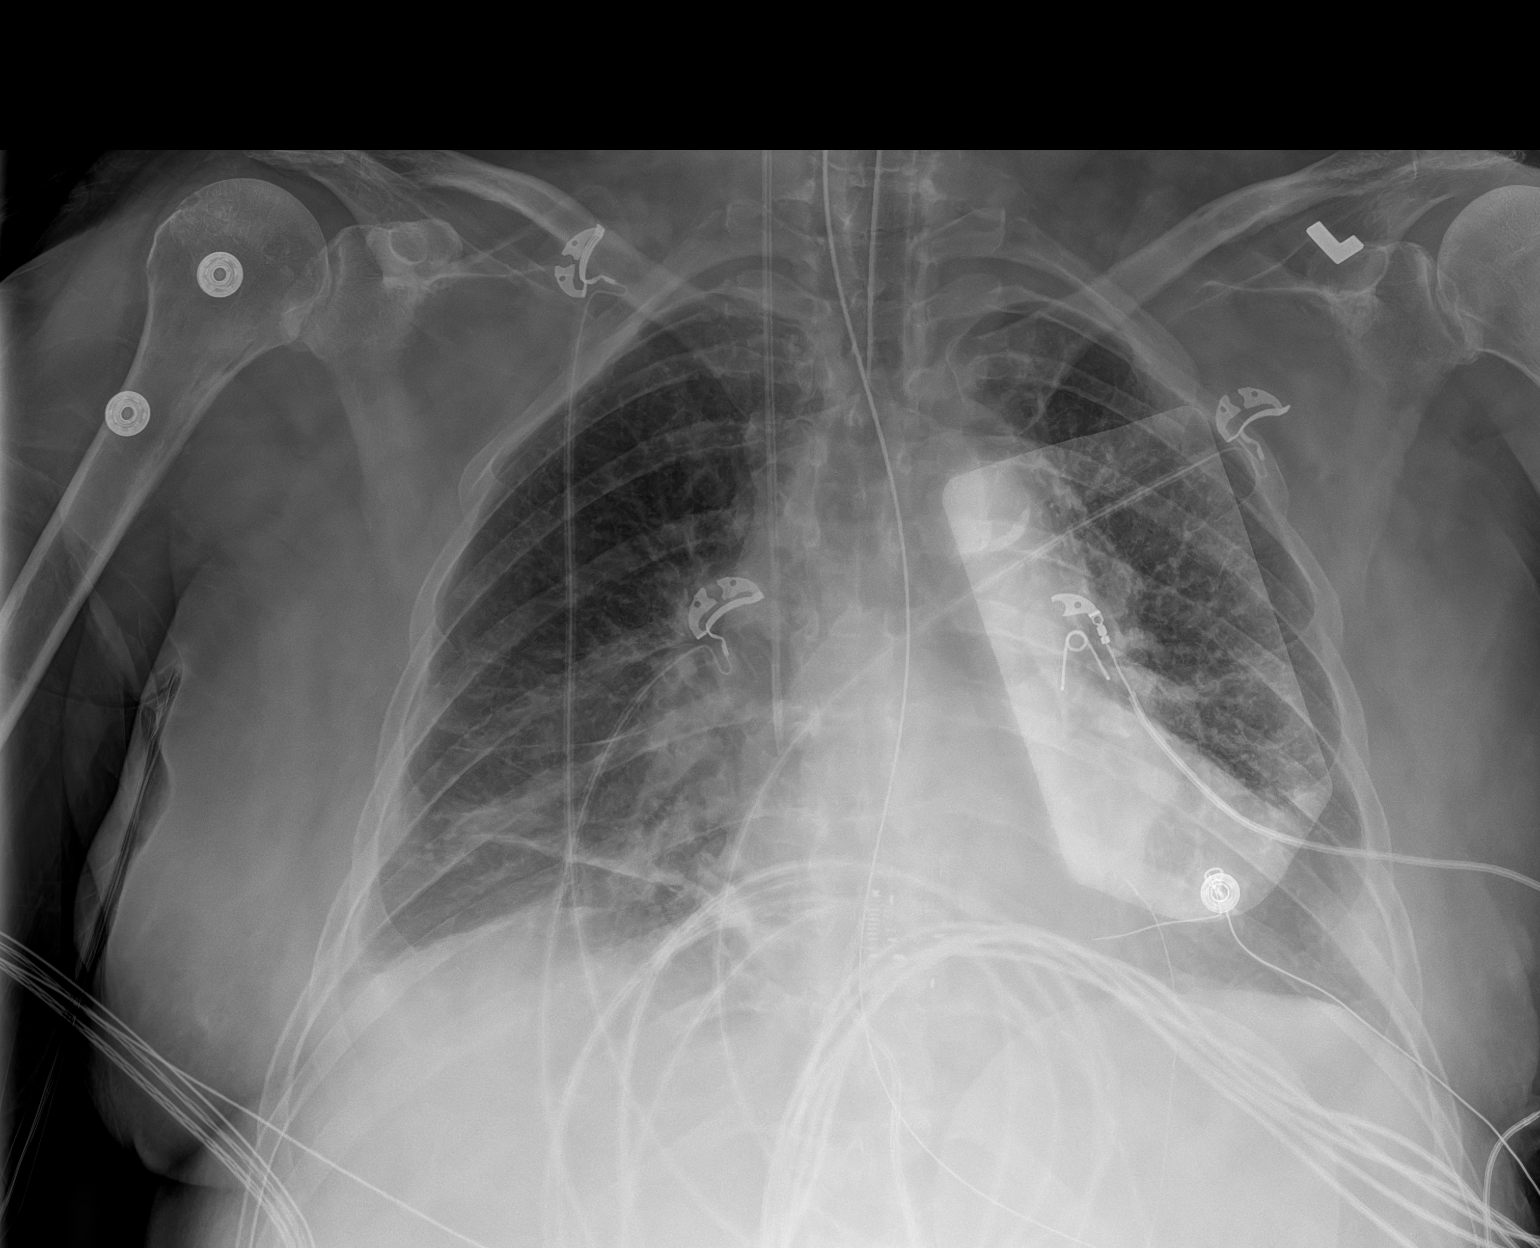

[1 of 1 positions shown; findings below may reference images not displayed]

FINDINGS: Support Apparatus:

--Endotracheal tube: Tip at the level of the clavicular heads.

--Enteric tube:Tip and sideport are below the field of view.

--Catheter(s):Right internal jugular vein approach central venous
catheter tip is at the cavoatrial junction.

--Other: None

Small right pleural effusion, unchanged. No focal airspace
consolidation. No pneumothorax.
IMPRESSION: Right internal jugular vein approach central venous catheter tip at
the cavoatrial junction. Unchanged small right pleural effusion. No
pneumothorax.

## 2023-01-22 IMAGING — DX DG CHEST 1V PORT
1 series · 1 of 1 positions shown · non-contrast
Comparison: 08/01/2020, CT 07/31/2020

CLINICAL DATA: ETT and central line placement

EXAM:
PORTABLE CHEST 1 VIEW

[chest]
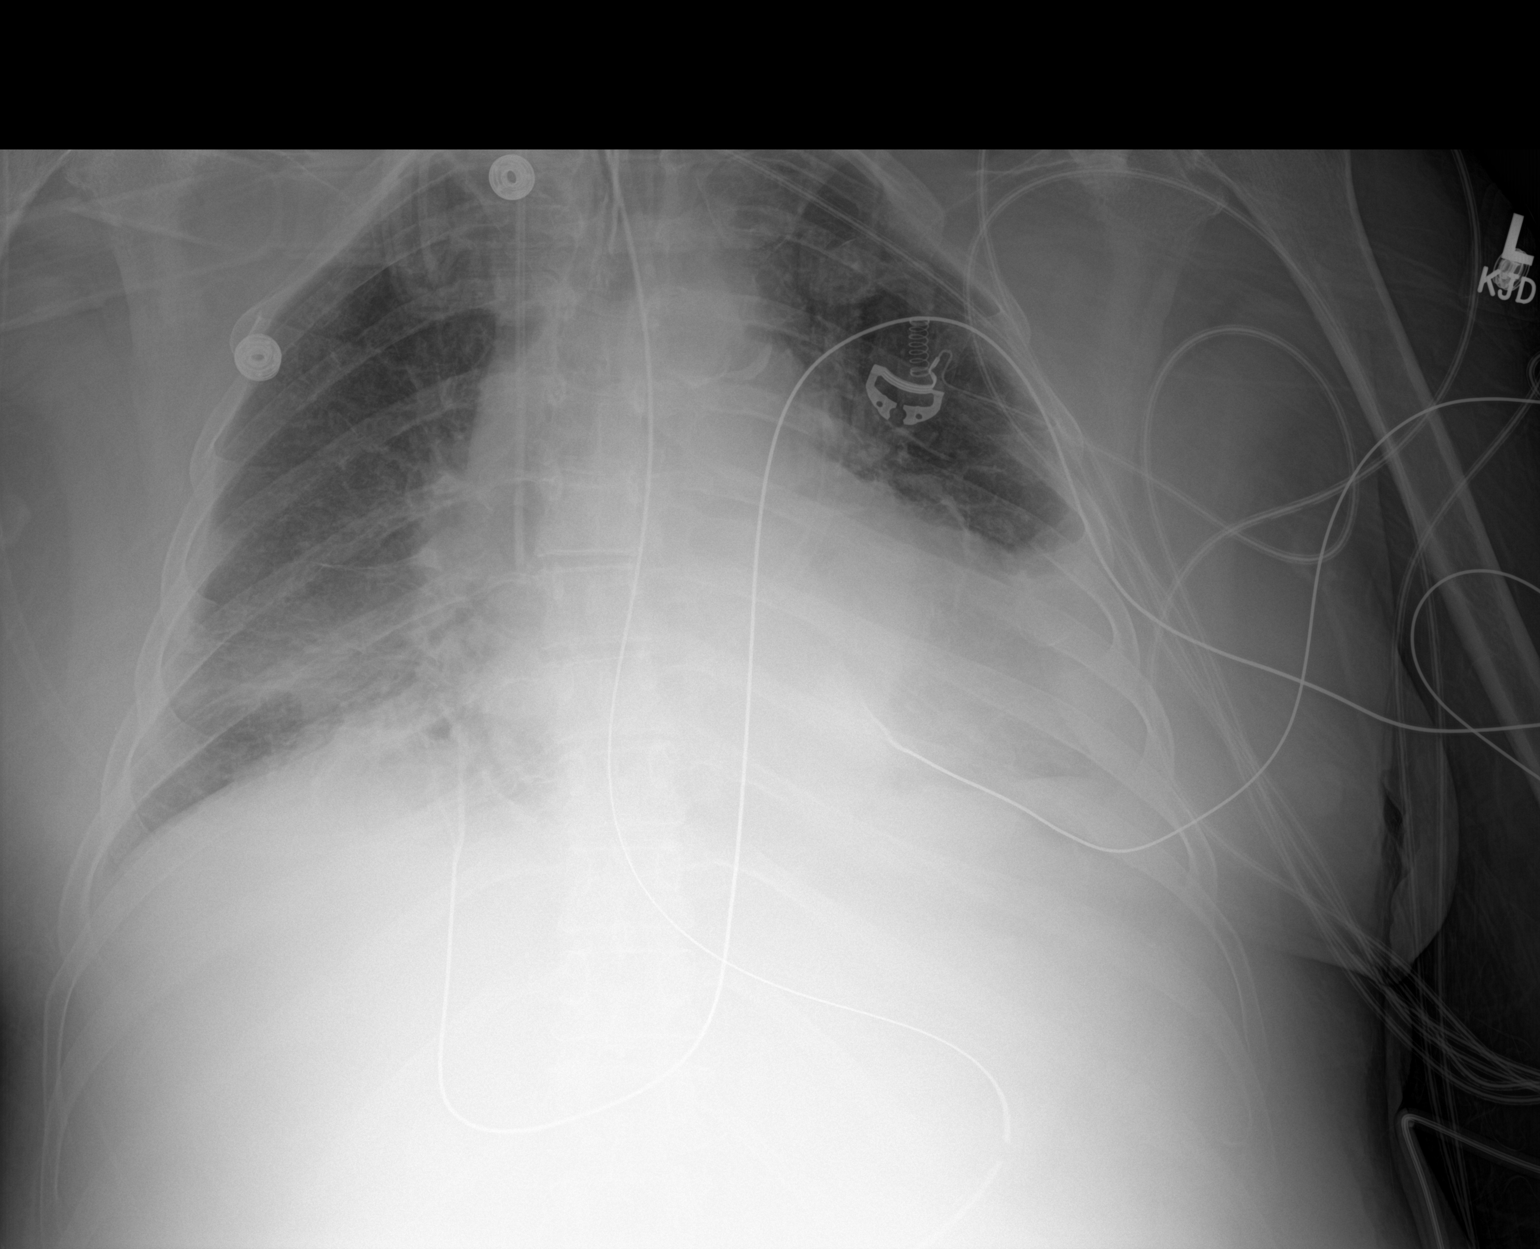

[1 of 1 positions shown; findings below may reference images not displayed]

FINDINGS: Endotracheal tube tip terminates 5.6 cm from the carina in the mid
trachea.

Transesophageal tube tip terminates below the margins of imaging
with the side port distal to the GE junction.

Right IJ approach central venous catheter tip terminates near the
superior cavoatrial junction.

Additional external support devices and telemetry leads overlie the
chest.

Diffuse heterogeneous opacities are present in both lungs which may
reflect a combination of atelectasis, airspace disease and layering
pleural effusions as seen on comparison CT. No visible pneumothorax.
Overall degree of opacity is slightly increased from prior,
particular in the medial right lung base and periphery of the left
lower lobe. Cardiomegaly is similar to comparison. The aorta is
calcified. The remaining cardiomediastinal contours are
unremarkable. No acute osseous or soft tissue abnormality.
Degenerative changes are present in the imaged spine and shoulders.
IMPRESSION: 1. Lines and tubes in satisfactory position.
2. Diffuse heterogeneous opacities in both lungs which may reflect a
combination of atelectasis, airspace disease and layering pleural
effusions as seen on comparison CT. Overall increase in the degree
of opacity in the lung bases.
